# Patient Record
Sex: Male | Born: 1935
Health system: Southern US, Community
[De-identification: ages and names within clinical notes are randomized; demographics above are authoritative.]

## PROBLEM LIST (undated history)

## (undated) DIAGNOSIS — I482 Chronic atrial fibrillation, unspecified: Secondary | ICD-10-CM

## (undated) DIAGNOSIS — I10 Essential (primary) hypertension: Secondary | ICD-10-CM

## (undated) DIAGNOSIS — K5790 Diverticulosis of intestine, part unspecified, without perforation or abscess without bleeding: Secondary | ICD-10-CM

## (undated) DIAGNOSIS — I340 Nonrheumatic mitral (valve) insufficiency: Secondary | ICD-10-CM

## (undated) HISTORY — PX: BACK SURGERY: SHX140

## (undated) HISTORY — DX: Diverticulosis of intestine, part unspecified, without perforation or abscess without bleeding: K57.90

## (undated) HISTORY — PX: NASAL SINUS SURGERY: SHX719

## (undated) HISTORY — DX: Essential (primary) hypertension: I10

---

## 1998-11-10 ENCOUNTER — Ambulatory Visit (HOSPITAL_BASED_OUTPATIENT_CLINIC_OR_DEPARTMENT_OTHER): Admission: RE | Admit: 1998-11-10 | Discharge: 1998-11-10 | Payer: Self-pay | Admitting: Diagnostic Radiology

## 1999-04-20 ENCOUNTER — Encounter: Payer: Self-pay | Admitting: Gastroenterology

## 1999-04-20 ENCOUNTER — Ambulatory Visit (HOSPITAL_COMMUNITY): Admission: RE | Admit: 1999-04-20 | Discharge: 1999-04-20 | Payer: Self-pay | Admitting: Gastroenterology

## 1999-06-03 ENCOUNTER — Ambulatory Visit (HOSPITAL_BASED_OUTPATIENT_CLINIC_OR_DEPARTMENT_OTHER): Admission: RE | Admit: 1999-06-03 | Discharge: 1999-06-03 | Payer: Self-pay | Admitting: *Deleted

## 1999-12-27 HISTORY — PX: INTRAOCULAR LENS INSERTION: SHX110

## 1999-12-27 HISTORY — PX: CATARACT EXTRACTION: SUR2

## 2000-05-14 ENCOUNTER — Inpatient Hospital Stay (HOSPITAL_COMMUNITY): Admission: EM | Admit: 2000-05-14 | Discharge: 2000-05-15 | Payer: Self-pay | Admitting: Emergency Medicine

## 2000-05-14 ENCOUNTER — Encounter: Payer: Self-pay | Admitting: Emergency Medicine

## 2001-01-23 ENCOUNTER — Emergency Department (HOSPITAL_COMMUNITY): Admission: EM | Admit: 2001-01-23 | Discharge: 2001-01-23 | Payer: Self-pay | Admitting: Emergency Medicine

## 2001-01-26 ENCOUNTER — Encounter (INDEPENDENT_AMBULATORY_CARE_PROVIDER_SITE_OTHER): Payer: Self-pay

## 2001-01-26 ENCOUNTER — Ambulatory Visit (HOSPITAL_BASED_OUTPATIENT_CLINIC_OR_DEPARTMENT_OTHER): Admission: RE | Admit: 2001-01-26 | Discharge: 2001-01-27 | Payer: Self-pay | Admitting: *Deleted

## 2001-01-26 ENCOUNTER — Encounter: Payer: Self-pay | Admitting: *Deleted

## 2001-01-26 ENCOUNTER — Encounter: Admission: RE | Admit: 2001-01-26 | Discharge: 2001-01-26 | Payer: Self-pay | Admitting: *Deleted

## 2001-06-11 ENCOUNTER — Ambulatory Visit (HOSPITAL_COMMUNITY): Admission: RE | Admit: 2001-06-11 | Discharge: 2001-06-11 | Payer: Self-pay | Admitting: Gastroenterology

## 2001-06-11 ENCOUNTER — Encounter: Payer: Self-pay | Admitting: Gastroenterology

## 2001-07-10 ENCOUNTER — Encounter: Payer: Self-pay | Admitting: Gastroenterology

## 2001-07-10 ENCOUNTER — Ambulatory Visit (HOSPITAL_COMMUNITY): Admission: RE | Admit: 2001-07-10 | Discharge: 2001-07-10 | Payer: Self-pay | Admitting: Gastroenterology

## 2002-09-15 ENCOUNTER — Emergency Department (HOSPITAL_COMMUNITY): Admission: EM | Admit: 2002-09-15 | Discharge: 2002-09-15 | Payer: Self-pay | Admitting: Emergency Medicine

## 2007-05-08 ENCOUNTER — Ambulatory Visit (HOSPITAL_COMMUNITY): Admission: RE | Admit: 2007-05-08 | Discharge: 2007-05-09 | Payer: Self-pay | Admitting: Orthopedic Surgery

## 2008-07-25 ENCOUNTER — Ambulatory Visit (HOSPITAL_COMMUNITY): Admission: RE | Admit: 2008-07-25 | Discharge: 2008-07-25 | Payer: Self-pay | Admitting: General Surgery

## 2008-07-25 HISTORY — PX: HERNIA REPAIR: SHX51

## 2011-05-10 NOTE — Op Note (Signed)
NAME:  George Potter, George Potter                ACCOUNT NO.:  [id number]   MEDICAL RECORD NO.:  [id number]          PATIENT TYPE:  AMB   LOCATION:  DAY                          FACILITY:  [facility]   PHYSICIAN:  Ronald A. Gioffre, M.D.DATE OF BIRTH:  1926/07/10   DATE OF PROCEDURE:  05/08/2007  DATE OF DISCHARGE:                               OPERATIVE REPORT   SURGEON:  Georges Lynch. Darrelyn Hillock, M.D.   ASSISTANT:  Marlowe Kays MD   PREOPERATIVE DIAGNOSES:  1. Lateral recess stenosis at L4-5 on the left which is severe.  2. Foraminal disk herniation on the left at L4-5 with partial foot      drop on the left.   POSTOPERATIVE DIAGNOSES:  1. Lateral recess stenosis at L4-5 on the left which is severe.  2. Foraminal disk herniation on the left at L4-5 with partial foot      drop on the left.   OPERATION:  1. Partial facetectomy and decompression of the lateral recess for      severe spinal stenosis at L4-5 on the left.  2. Excision of a microdiskectomy at L4-5 on the left for a foraminal      disk.   PROCEDURE:  Under general anesthesia, routine orthopedic prep and  draping of the back was carried out.  The patient had 1 gram of IV Ancef  preop.  With the patient on spinal frame two needles were placed in the  back, x-ray was taken to localize our position.  Following that an  incision was made over the L4-5 space and extended distally.  Muscle was  stripped from the lamina at L4-5 on the left and from the spinous  process.  Another x-ray was taken with instruments in place to verify  the disk space.  At this time I then utilized to bur to bur out  laterally to decompress the lateral recess and to help to our  laminectomies.  Following this, I then went down and brought the  microscope in, removed the ligamentum flavum and continued our  dissection out laterally, proximally and distally, carried out  hemilaminectomy and a partial facetectomy and foraminotomies for both 4  and 5 roots.  Note he  had a large amount of disk material up under the  subligamentous area, disk was displaced in the foramen.  I took a nerve  hook and teased the material out.  I then utilized the Epstein curette  to curette the remaining part of the disk into the space and then  completed diskectomy.  Once we decompressed the lateral recess and the  foramen and removed our foraminal disk.  We then went into the disk  space in the midline and removed the remaining part of the disk  material.  We did a foraminotomy for the 5 root as well.  When the  procedure was completed we were able to easily pass a hockey-stick out  the foramina of the 4 and 5 roots.  We cauterized lateral recess veins  with a bipolar.  Thoroughly irrigated out the wound.  We reinspected the  nerve roots and the  dura.  We now had good freedom of the root.  Following that we loosely applied some thrombin-soaked Gelfoam and  closed the wound in layers usual  fashion.  I left the proximal portions of the proximal and distal deep  parts of the wound open for drainage purposes.  I injected 20 mL 0.5%  Marcaine epinephrine into the muscle.  The patient had 15 mg of Toradol  IV prior to leaving surgery.  The main part of the wound was closed with  metal staples.  Sterile Neosporin dressing was applied.           ______________________________  Georges Lynch Darrelyn Hillock, M.D.     RAG/MEDQ  D:  05/08/2007  T:  05/09/2007  Job:  [id number]

## 2011-05-10 NOTE — Op Note (Signed)
NAME:  [name], [name] NO.:  [id number]   MEDICAL RECORD NO.:  [id number]          PATIENT TYPE:  AMB   LOCATION:  DAY                          FACILITY:  [facility]   PHYSICIAN:  Leonie Man, M.D.   DATE OF BIRTH:  28-Aug-1926   DATE OF PROCEDURE:  07/25/2008  DATE OF DISCHARGE:                               OPERATIVE REPORT   PREOPERATIVE DIAGNOSIS:  Right inguinal hernia   POSTOPERATIVE DIAGNOSIS:  Right direct and indirect inguinal hernias.   PROCEDURE:  Right inguinal herniorrhaphy using a large polypropylene  mesh.   SURGEON:  Leonie Man, M.D.   ASSISTANT:  OR nurse.   ANESTHESIA:  General.   NOTE:  The patient is an 77 year old gentleman who was been having some  increasing symptoms from a large right-sided inguinal hernia.  No  symptoms of continued bowel obstruction.  No bladder neck obstruction.  No chronic constipation.  The patient comes to the operating room now  after the risks and potential benefits of surgery have been fully  discussed.  All questions were answered and consent for surgery  obtained.   PROCEDURE:  With the patient positioned supinely, following the  induction of satisfactory general anesthesia the right groin is prepped  and draped to be included in the sterile operative field.  The patient  is identified as George Potter and the side to be operated on as a right  side which was appropriately marked.  All precautions and the operating  room precautions were carried out.   I infiltrated the right lower abdominal crease with 0.5% Marcaine with  epinephrine and made a transverse incision that was deepened down  through to the external oblique aponeurosis.  This was opened up through  the external inguinal ring with protection of the ilioinguinal nerve.  The spermatic cord was elevated and held with a Penrose drain.  Dissection along the anterior medial border of the spermatic cord  revealed an indirect hernia sac which is  dissected free from the  surrounding cord contents and taken up to the internal ring.  The sac is  opened.  There were no intraabdominal contents within it.  The sac was  suture ligated with 2-0 silk and the redundant sac is amputated and  discarded.  On inspection of the direct space there was a large direct  inguinal hernia which was dissected free from the spermatic cord and  repaired with an onlay patch of polypropylene mesh which was sewn in at  the pubic tubercle with 2-0 Novofil.  The suture line was run up along  the conjoined tendon to the internal ring and another suture line from  the pubic tubercle, run up along and sewing the mesh to the shelving  edge of Poupart's ligament up to the internal ring.  The mesh is split  so as to allow the spermatic cord to come through the leaflets of the  mesh and the tails of the mesh were then sutured down behind the cord  into the internal oblique muscle.  All areas of dissection were checked  for hemostasis and noted to  be dry.  The pubic tubercle was injected  with additional injections of 0.5% Marcaine with epinephrine.  The  spermatic cord was returned to its normal anatomical position and the  external oblique aponeurosis closed with running 2-0 Vicryl suture.  Subcutaneous tissues and Scarpa fascia  closed with running suture of 3-0 Vicryl suture and the skin was closed  with 4-0 Monocryl, reinforced with Dermabond and Steri-Strips and a  sterile dressing was applied.  The anesthetic reversed.  The patient  removed from the operating room to the recovery room in stable  condition.  He tolerated the procedure well.      Leonie Man, M.D.  Electronically Signed     PB/MEDQ  D:  07/25/2008  T:  07/25/2008  Job:  [id number]

## 2011-05-13 NOTE — H&P (Signed)
[place]. [facility]  Patient:    George Potter, George Potter                       MRN: [id number] Adm. Date:  [id number] Attending:  Beatris Ship                         History and Physical  CHIEF COMPLAINT:  Abdominal pain, nausea and vomiting.  HISTORY OF PRESENT ILLNESS:  Mr. Rodier is a pleasant 77 year old gentleman with a history of hypertension and no other significant history presenting at this time with increasing abdominal pain, nausea, vomiting, and diarrhea.  The patient has very little past medical history with the exception of chronic hypertension, which has been well-controlled.  He had approximately two to three days of lower abdominal cramps, most notable Friday, followed by three episodes of bilious vomiting and three episodes of loose, green stools.  Prior to yesterdays episode, he did have a normal bowel movement reportedly on Thursday.  Accompanying this, he has developed progressive lower abdominal pain and discomfort and presented to the emergency room, where he was found to be evidently distended and have a radiographic pattern possibly compatible with a bowel obstruction.  He has had no fever, chills, or sweats.  He denies any urinary symptoms.  He has had no bloody stools or bloody emesis.  He has had no cough or sputum.  ALLERGIES:  Intolerance to CODEINE.  MEDICATIONS:  Hydrochlorothiazide 25 mg daily, and it appears atenolol, dose not known.  PAST MEDICAL HISTORY:  Essential hypertension.  PAST SURGICAL HISTORY:  Right cataract extraction/lens implant April 2001, as well as multiple sinus surgeries.  FAMILY HISTORY:  Noncontributory.  SOCIAL HISTORY:  The patient is married and has one child and three grandchildren.  He is retired at this time.  He does not smoke or drink.  PHYSICAL EXAMINATION:  VITAL SIGNS:  Temperature 98.4, blood pressure 150/80, pulse 70, respiratory rate 20.  Weight 167 pounds.  Height 5 feet 11  inches.  GENERAL:  The patient is pleasant, in no distress.  HEENT:  Anicteric.  Pupils round, reactive.  Oropharynx benign.  NECK:  Supple, no JVD.  Carotids 2+/4+, no bruits.  LUNGS:  Clear.  CARDIOVASCULAR:  Regular rate and rhythm, no murmur, gallop, rub, or heave.  ABDOMEN:  Soft, bowel sounds present and good, no tenderness, minimal distention, no rebound or guarding, no hepatosplenomegaly.  EXTREMITIES:  No cyanosis, clubbing, or edema.  Pulses 2+.  NEUROLOGIC:  The patient is nonfocal.  ASSESSMENT: 1. Probable viral gastroenteritis with ileus.  Doubt true small bowel    obstruction based upon his already clinical improvement just in the    emergency room.  Given no further vomiting and benign exam, we will    withhold the NG tube that was planned given the inability to pass it at    this time, with further decisions regarding this depending on his course.    We will correct his electrolytes as well. 2. Essential hypertension, stable. DD:  05/14/00 TD:  05/15/00 Job: [id number]

## 2011-05-13 NOTE — Op Note (Signed)
[place]. [facility]  Patient:    George Potter, George Potter                       MRN: [id number] Proc. Date: 01/26/01 Adm. Date:  [id number] Attending:  Claudina Lick                           Operative Report  PREOPERATIVE DIAGNOSES:  Acute-chronic left frontal and ethmoid sinusitis with left preseptal orbital cellulitis.  PROCEDURE:  Left endoscopic ethmoidectomy and left transnasal frontal sinusotomy with drainage of frontal and ethmoid abscesses, with insertion left frontal Rains stent.  SURGEON:  Robert L. Lyman Bishop, M.D.  ANESTHESIA:  General.  INDICATION FOR PROCEDURE:  This patient had had a previous history of chronic recurring sinusitis and had undergone previous ethmoidectomy, maxillary antrostomies, and frontal sinusotomies, with an insertion of a Rains stent on the left side.  Following removal of the stent, patient had done well for almost a year and then approximately one week prior to admission began having pain and swelling in his left eye, particularly in the medial orbital area. This became progressively more severe despite antibiotics, Augmentin.  CT scan showed an opacified left frontal sinus with some clouding and opacification in the anterior superior left ethmoid, and ophthalmology examination showed evidence of a preseptal orbital cellulitis.  Patient admitted for surgery. Examination showed a very diffuse, marked erythema and edema of the left periorbital, upper and to a lesser extent left lower eyelid, beginning to cross over the dorsum of the nose and slightly in the right intraorbital area. Intranasally, endoscopically both sides looked essentially clear.  There was smooth mucosa, somewhat erythematous, in the anterior superior nasal cavity on the left side, but there was no exudate, crusting, or other abnormality.  DESCRIPTION OF PROCEDURE:  After satisfactory general endotracheal anesthesia had been induced, topical  epinephrine packs were placed in the left superior nasal cavity, and the area was then infiltrated with 1% Xylocaine containing 1:100,000 epinephrine for hemostasis.  Nose and face were then prepped with Betadine and sterile drapes applied.  The InstaTrak headset was applied, and the InstaTrak was used throughout the operative procedure.  The straight and the angled sections were calibrated and using these as a guide and the 0 degree endoscope, the soft tissue mucosa in the region of the left frontoethmoid recess area was taken down.  Extreme amount of scarring was encountered.  The septum had been pulled over.  This was released, and as this was followed superiorly, the abscess was opened up and cultures were taken for both anaerobic and aerobic, and quick, rapid Gram stains were done, which showed no bacteria.  A large amount of bone around the nasofrontal duct area was taken down with the curette and bone-biting forceps.  Very thick, inflammatory mucosa was removed from the lower part of the left frontal sinus cavity and supraorbital area, and above that another narrowed area into the superior part of the sinus was opened up with the angled curette so that I could then pass the suction easily into that area.  As the scarring in the roof of the ethmoid on the left side was removed, some thick, heavy bone in that area was taken down, and a small abscess lying against the posterior ethmoid plate was removed along with a small bone with some exposure of 3-4 mm of orbital fat posteriorly.  The anterior ethmoid artery was cauterized with  the bipolar.  A complete cleanout of the ethmoid was accomplished.  A large Rains stent with the shaft shortened approximately one-third was then inserted into the left frontal, extending as far superiorly as it could, and a stirp of Adaptic gauze packing impregnated with Cortisporin ointment was then packed around the stent, extending up into the inferior part  of the frontal sinus and surrounding the shaft of the stent posteriorly as well.  Thick reinforced Silastic sheeting was sutured to the left side of the nasal septum, extending to the roof of the nose and an additional strip of Adaptic gauze impregnated with Cortisporin was placed in the roof of the nose anteriorly, leaving the nose open inferiorly.  The patient had a large opening into the left ethmoid sinus, well-healed, and this was left unobstructed.  Estimated blood loss was less than 50 cc.  The patient was given 3 g of Unasyn IV and 12 mg of Decadron IV intraoperatively.  He tolerated the procedure well, was awakened from anesthesia and taken to the recovery room in satisfactory condition. DD:  01/26/01 TD:  01/27/01 Job: [id number]

## 2011-09-23 LAB — CBC
HCT: 45.3
Hemoglobin: 15.3
MCHC: 33.7
MCV: 91.1
RBC: 4.98

## 2011-09-23 LAB — COMPREHENSIVE METABOLIC PANEL
ALT: 17
BUN: 8
CO2: 29
Calcium: 9.5
GFR calc non Af Amer: >60
Glucose, Bld: 109 — ABNORMAL HIGH
Sodium: 137

## 2011-09-23 LAB — DIFFERENTIAL
Basophils Relative: 1
Eosinophils Absolute: 0.1
Lymphs Abs: 2.1
Neutro Abs: 3.3
Neutrophils Relative %: 54

## 2011-09-23 LAB — PROTIME-INR
INR: 1
Prothrombin Time: 13.9

## 2012-02-15 DIAGNOSIS — M7981 Nontraumatic hematoma of soft tissue: Secondary | ICD-10-CM | POA: Diagnosis not present

## 2012-02-15 DIAGNOSIS — I8310 Varicose veins of unspecified lower extremity with inflammation: Secondary | ICD-10-CM | POA: Diagnosis not present

## 2012-03-13 DIAGNOSIS — I8310 Varicose veins of unspecified lower extremity with inflammation: Secondary | ICD-10-CM | POA: Diagnosis not present

## 2012-03-21 DIAGNOSIS — R21 Rash and other nonspecific skin eruption: Secondary | ICD-10-CM | POA: Diagnosis not present

## 2012-05-15 DIAGNOSIS — I8310 Varicose veins of unspecified lower extremity with inflammation: Secondary | ICD-10-CM | POA: Diagnosis not present

## 2012-05-15 DIAGNOSIS — M79609 Pain in unspecified limb: Secondary | ICD-10-CM | POA: Diagnosis not present

## 2012-05-15 DIAGNOSIS — M7981 Nontraumatic hematoma of soft tissue: Secondary | ICD-10-CM | POA: Diagnosis not present

## 2012-05-29 DIAGNOSIS — M7981 Nontraumatic hematoma of soft tissue: Secondary | ICD-10-CM | POA: Diagnosis not present

## 2012-05-29 DIAGNOSIS — M79609 Pain in unspecified limb: Secondary | ICD-10-CM | POA: Diagnosis not present

## 2012-05-29 DIAGNOSIS — I8310 Varicose veins of unspecified lower extremity with inflammation: Secondary | ICD-10-CM | POA: Diagnosis not present

## 2012-09-18 DIAGNOSIS — Z23 Encounter for immunization: Secondary | ICD-10-CM | POA: Diagnosis not present

## 2012-12-07 DIAGNOSIS — H35379 Puckering of macula, unspecified eye: Secondary | ICD-10-CM | POA: Diagnosis not present

## 2012-12-07 DIAGNOSIS — H353190 Nonexudative age-related macular degeneration, unspecified eye, stage unspecified: Secondary | ICD-10-CM | POA: Diagnosis not present

## 2012-12-25 DIAGNOSIS — I8310 Varicose veins of unspecified lower extremity with inflammation: Secondary | ICD-10-CM | POA: Diagnosis not present

## 2013-01-03 DIAGNOSIS — I8310 Varicose veins of unspecified lower extremity with inflammation: Secondary | ICD-10-CM | POA: Diagnosis not present

## 2013-01-08 DIAGNOSIS — R7301 Impaired fasting glucose: Secondary | ICD-10-CM | POA: Diagnosis not present

## 2013-01-08 DIAGNOSIS — E785 Hyperlipidemia, unspecified: Secondary | ICD-10-CM | POA: Diagnosis not present

## 2013-01-08 DIAGNOSIS — Z125 Encounter for screening for malignant neoplasm of prostate: Secondary | ICD-10-CM | POA: Diagnosis not present

## 2013-01-08 DIAGNOSIS — I10 Essential (primary) hypertension: Secondary | ICD-10-CM | POA: Diagnosis not present

## 2013-01-15 DIAGNOSIS — I440 Atrioventricular block, first degree: Secondary | ICD-10-CM | POA: Diagnosis not present

## 2013-01-15 DIAGNOSIS — H6120 Impacted cerumen, unspecified ear: Secondary | ICD-10-CM | POA: Diagnosis not present

## 2013-01-15 DIAGNOSIS — E875 Hyperkalemia: Secondary | ICD-10-CM | POA: Diagnosis not present

## 2013-01-15 DIAGNOSIS — Z0000 Encounter for general adult medical examination without abnormal findings: Secondary | ICD-10-CM | POA: Diagnosis not present

## 2013-04-09 DIAGNOSIS — I10 Essential (primary) hypertension: Secondary | ICD-10-CM | POA: Diagnosis not present

## 2013-04-09 DIAGNOSIS — M436 Torticollis: Secondary | ICD-10-CM | POA: Diagnosis not present

## 2013-04-09 DIAGNOSIS — IMO0002 Reserved for concepts with insufficient information to code with codable children: Secondary | ICD-10-CM | POA: Diagnosis not present

## 2013-06-07 DIAGNOSIS — H05229 Edema of unspecified orbit: Secondary | ICD-10-CM | POA: Diagnosis not present

## 2013-06-07 DIAGNOSIS — J019 Acute sinusitis, unspecified: Secondary | ICD-10-CM | POA: Diagnosis not present

## 2013-06-10 DIAGNOSIS — I10 Essential (primary) hypertension: Secondary | ICD-10-CM | POA: Diagnosis not present

## 2013-06-10 DIAGNOSIS — J301 Allergic rhinitis due to pollen: Secondary | ICD-10-CM | POA: Diagnosis not present

## 2013-06-10 DIAGNOSIS — H1030 Unspecified acute conjunctivitis, unspecified eye: Secondary | ICD-10-CM | POA: Diagnosis not present

## 2013-06-10 DIAGNOSIS — J321 Chronic frontal sinusitis: Secondary | ICD-10-CM | POA: Diagnosis not present

## 2013-09-18 DIAGNOSIS — Z23 Encounter for immunization: Secondary | ICD-10-CM | POA: Diagnosis not present

## 2013-09-23 DIAGNOSIS — M79609 Pain in unspecified limb: Secondary | ICD-10-CM | POA: Diagnosis not present

## 2013-09-23 DIAGNOSIS — T148XXA Other injury of unspecified body region, initial encounter: Secondary | ICD-10-CM | POA: Diagnosis not present

## 2013-12-09 DIAGNOSIS — H04229 Epiphora due to insufficient drainage, unspecified lacrimal gland: Secondary | ICD-10-CM | POA: Diagnosis not present

## 2013-12-09 DIAGNOSIS — H1045 Other chronic allergic conjunctivitis: Secondary | ICD-10-CM | POA: Diagnosis not present

## 2013-12-09 DIAGNOSIS — H353190 Nonexudative age-related macular degeneration, unspecified eye, stage unspecified: Secondary | ICD-10-CM | POA: Diagnosis not present

## 2013-12-09 DIAGNOSIS — H43819 Vitreous degeneration, unspecified eye: Secondary | ICD-10-CM | POA: Diagnosis not present

## 2013-12-09 DIAGNOSIS — Z961 Presence of intraocular lens: Secondary | ICD-10-CM | POA: Diagnosis not present

## 2013-12-09 DIAGNOSIS — H35379 Puckering of macula, unspecified eye: Secondary | ICD-10-CM | POA: Diagnosis not present

## 2014-01-06 DIAGNOSIS — R059 Cough, unspecified: Secondary | ICD-10-CM | POA: Diagnosis not present

## 2014-01-06 DIAGNOSIS — IMO0002 Reserved for concepts with insufficient information to code with codable children: Secondary | ICD-10-CM | POA: Diagnosis not present

## 2014-01-06 DIAGNOSIS — J321 Chronic frontal sinusitis: Secondary | ICD-10-CM | POA: Diagnosis not present

## 2014-01-06 DIAGNOSIS — R05 Cough: Secondary | ICD-10-CM | POA: Diagnosis not present

## 2014-01-06 DIAGNOSIS — H6120 Impacted cerumen, unspecified ear: Secondary | ICD-10-CM | POA: Diagnosis not present

## 2014-02-10 DIAGNOSIS — I10 Essential (primary) hypertension: Secondary | ICD-10-CM | POA: Diagnosis not present

## 2014-02-10 DIAGNOSIS — Z125 Encounter for screening for malignant neoplasm of prostate: Secondary | ICD-10-CM | POA: Diagnosis not present

## 2014-02-10 DIAGNOSIS — R7301 Impaired fasting glucose: Secondary | ICD-10-CM | POA: Diagnosis not present

## 2014-02-10 DIAGNOSIS — E785 Hyperlipidemia, unspecified: Secondary | ICD-10-CM | POA: Diagnosis not present

## 2014-02-11 DIAGNOSIS — Z1212 Encounter for screening for malignant neoplasm of rectum: Secondary | ICD-10-CM | POA: Diagnosis not present

## 2014-02-24 DIAGNOSIS — I440 Atrioventricular block, first degree: Secondary | ICD-10-CM | POA: Diagnosis not present

## 2014-02-24 DIAGNOSIS — Z23 Encounter for immunization: Secondary | ICD-10-CM | POA: Diagnosis not present

## 2014-02-24 DIAGNOSIS — R011 Cardiac murmur, unspecified: Secondary | ICD-10-CM | POA: Diagnosis not present

## 2014-02-24 DIAGNOSIS — E785 Hyperlipidemia, unspecified: Secondary | ICD-10-CM | POA: Diagnosis not present

## 2014-02-24 DIAGNOSIS — Z1331 Encounter for screening for depression: Secondary | ICD-10-CM | POA: Diagnosis not present

## 2014-02-24 DIAGNOSIS — Z0000 Encounter for general adult medical examination without abnormal findings: Secondary | ICD-10-CM | POA: Diagnosis not present

## 2014-02-24 DIAGNOSIS — H8110 Benign paroxysmal vertigo, unspecified ear: Secondary | ICD-10-CM | POA: Diagnosis not present

## 2014-02-24 DIAGNOSIS — I10 Essential (primary) hypertension: Secondary | ICD-10-CM | POA: Diagnosis not present

## 2014-02-24 DIAGNOSIS — Z125 Encounter for screening for malignant neoplasm of prostate: Secondary | ICD-10-CM | POA: Diagnosis not present

## 2014-02-24 DIAGNOSIS — L989 Disorder of the skin and subcutaneous tissue, unspecified: Secondary | ICD-10-CM | POA: Diagnosis not present

## 2014-02-24 DIAGNOSIS — L259 Unspecified contact dermatitis, unspecified cause: Secondary | ICD-10-CM | POA: Diagnosis not present

## 2014-02-27 DIAGNOSIS — C44320 Squamous cell carcinoma of skin of unspecified parts of face: Secondary | ICD-10-CM | POA: Diagnosis not present

## 2014-02-27 DIAGNOSIS — D485 Neoplasm of uncertain behavior of skin: Secondary | ICD-10-CM | POA: Diagnosis not present

## 2014-04-09 DIAGNOSIS — D485 Neoplasm of uncertain behavior of skin: Secondary | ICD-10-CM | POA: Diagnosis not present

## 2014-04-09 DIAGNOSIS — Z85828 Personal history of other malignant neoplasm of skin: Secondary | ICD-10-CM | POA: Diagnosis not present

## 2014-07-09 ENCOUNTER — Encounter (HOSPITAL_COMMUNITY): Payer: Self-pay | Admitting: Emergency Medicine

## 2014-07-09 ENCOUNTER — Emergency Department (HOSPITAL_COMMUNITY)
Admission: EM | Admit: 2014-07-09 | Discharge: 2014-07-09 | Disposition: A | Discharge status: Home / Self Care | Payer: Medicare Other | Attending: Emergency Medicine | Admitting: Emergency Medicine

## 2014-07-09 ENCOUNTER — Emergency Department (HOSPITAL_COMMUNITY): Payer: Medicare Other

## 2014-07-09 DIAGNOSIS — Z7982 Long term (current) use of aspirin: Secondary | ICD-10-CM | POA: Diagnosis not present

## 2014-07-09 DIAGNOSIS — I10 Essential (primary) hypertension: Secondary | ICD-10-CM | POA: Insufficient documentation

## 2014-07-09 DIAGNOSIS — R079 Chest pain, unspecified: Secondary | ICD-10-CM | POA: Diagnosis not present

## 2014-07-09 DIAGNOSIS — R0789 Other chest pain: Secondary | ICD-10-CM | POA: Insufficient documentation

## 2014-07-09 DIAGNOSIS — Z79899 Other long term (current) drug therapy: Secondary | ICD-10-CM | POA: Insufficient documentation

## 2014-07-09 DIAGNOSIS — E785 Hyperlipidemia, unspecified: Secondary | ICD-10-CM | POA: Insufficient documentation

## 2014-07-09 LAB — CBC
HCT: 42.3 % (ref 39.0–52.0)
HEMOGLOBIN: 14.6 g/dL (ref 13.0–17.0)
MCH: 30.2 pg (ref 26.0–34.0)
MCHC: 34.5 g/dL (ref 30.0–36.0)
MCV: 87.6 fL (ref 78.0–100.0)
Platelets: 175 10*3/uL (ref 150–400)
RBC: 4.83 MIL/uL (ref 4.22–5.81)
RDW: 13.3 % (ref 11.5–15.5)
WBC: 6.1 10*3/uL (ref 4.0–10.5)

## 2014-07-09 LAB — BASIC METABOLIC PANEL
Anion gap: 15 (ref 5–15)
BUN: 7 mg/dL (ref 6–23)
CHLORIDE: 99 meq/L (ref 96–112)
CO2: 24 mEq/L (ref 19–32)
Calcium: 9.4 mg/dL (ref 8.4–10.5)
Creatinine, Ser: 0.89 mg/dL (ref 0.50–1.35)
GFR calc non Af Amer: 75 mL/min — ABNORMAL LOW (ref >90)
GFR, EST AFRICAN AMERICAN: 87 mL/min — AB (ref >90)
Glucose, Bld: 104 mg/dL — ABNORMAL HIGH (ref 70–99)
POTASSIUM: 4.1 meq/L (ref 3.7–5.3)
Sodium: 138 mEq/L (ref 137–147)

## 2014-07-09 LAB — I-STAT TROPONIN, ED: TROPONIN I, POC: 0.01 ng/mL (ref 0.00–0.08)

## 2014-07-09 NOTE — ED Notes (Signed)
Pt in c/o left upper chest pain that has been intermittent since yesterday afternoon, denies pain at this time, denies radiation with pain or other symptoms, took 81mg  ASA this am. No distress noted.

## 2014-07-09 NOTE — ED Notes (Signed)
Patient presents today c/o of a pressure sensation in left upper chest near shoulder that is intermittent and last for seconds at a time, not reproducible with palpation or movement, nor does it occur or increase with deep inspiration.  The pain occurred three times since yesterday afternoon  and three times since this am at 0500, although he did not wake up with the pain. Denies recent long travel or recent surgeries.  Denies cardiac history except for hyperlipidemia and hypertension, for which he takes Lisinopril, amlodapine, and Crestor.

## 2014-07-09 NOTE — ED Notes (Addendum)
Patient transported to x-ray. ?

## 2014-07-09 NOTE — ED Notes (Signed)
Dr. Carolyn Stare at bedside

## 2014-07-09 NOTE — ED Provider Notes (Signed)
CSN: [id number]     Arrival date & time 07/09/14  0914 History   First MD Initiated Contact with Patient 07/09/14 [phone]     Chief Complaint  Patient presents with  . Chest Pain     (Consider location/radiation/quality/duration/timing/severity/associated sxs/prior Treatment) HPI George Potter is a 80 y.o. male that presents to the ED with upper left sided chest pain. Pain started yesterday afternoon. He had three episodes yesterday and two today. Episodes lasted only a few seconds and have been non-exertional. Pain comes on randomly and is not alleviated or aggravated by anything. The pain is reproducible and does not radiate. He has no shortness of breath, dizziness, headaches, nausea/vomiting. No history of leg swelling, blood clots or pulmonary embolism.  PMH: Hypertension; Hyperlipidemia  Past Surgical History  Procedure Laterality Date  . Nasal sinus surgery      multiple surgeries  . Cataract extraction Right 2001  . Intraocular lens insertion Right 2001  . Hernia repair Right 07/25/2008    Right inguinal herniorrhaphy using a large polypropylene  . Back surgery      Partial facetectomy and decompression L4-L5   No family history on file. History  Substance Use Topics  . Smoking status: Never Smoker   . Smokeless tobacco: Not on file  . Alcohol Use: Not on file    Review of Systems  Constitutional: Negative for fever.  Respiratory: Negative for cough, chest tightness, shortness of breath and wheezing.   Cardiovascular: Positive for chest pain. Negative for palpitations and leg swelling.  Gastrointestinal: Negative for nausea and vomiting.  All other systems reviewed and are negative.     Allergies  Review of patient's allergies indicates no known allergies.  Home Medications   Prior to Admission medications   Medication Sig Start Date End Date Taking? Authorizing Provider  amLODipine (NORVASC) 5 MG tablet Take 5 mg by mouth daily.   Yes Historical Provider, MD   aspirin EC 81 MG tablet Take 81 mg by mouth every Monday, Wednesday, and Friday.   Yes Historical Provider, MD  CALCIUM-VITAMIN D PO Take 1 tablet by mouth daily.   Yes Historical Provider, MD  lisinopril (PRINIVIL,ZESTRIL) 40 MG tablet Take 40 mg by mouth daily.   Yes Historical Provider, MD  Omega-3 Fatty Acids (FISH OIL) 1200 MG CAPS Take 1,200 mg by mouth daily.   Yes Historical Provider, MD  omeprazole (PRILOSEC) 20 MG capsule Take 20 mg by mouth daily.   Yes Historical Provider, MD  polyvinyl alcohol-povidone (REFRESH) 1.4-0.6 % ophthalmic solution Place 1-2 drops into both eyes as needed (for dry eyes).   Yes Historical Provider, MD  rosuvastatin (CRESTOR) 20 MG tablet Take 20 mg by mouth daily.   Yes Historical Provider, MD   BP 134/72  Pulse 60  Temp(Src) 97 F (36.1 C) (Oral)  Resp 20  Ht 5\' 10"  (1.778 m)  Wt 152 lb (68.947 kg)  BMI 21.81 kg/m2  SpO2 96%  Physical Exam  Constitutional: He is oriented to person, place, and time. He appears well-developed and well-nourished.  Eyes: Conjunctivae and EOM are normal.  Cardiovascular: Normal rate, regular rhythm and normal heart sounds.   Pulmonary/Chest: Effort normal and breath sounds normal. He exhibits tenderness (left pectoralis major near shoulder).  Abdominal: Soft. Bowel sounds are normal.  Musculoskeletal: He exhibits no edema and no tenderness.  Lymphadenopathy:    He has no axillary adenopathy.  Neurological: He is alert and oriented to person, place, and time.  Skin: Skin is warm  and dry.    ED Course  Procedures (including critical care time) Labs Review Labs Reviewed  BASIC METABOLIC PANEL - Abnormal; Notable for the following:    Glucose, Bld 104 (*)    GFR calc non Af Amer 75 (*)    GFR calc Af Amer 87 (*)    All other components within normal limits  CBC  I-STAT TROPOININ, ED    Imaging Review Dg Chest 2 View  07/09/2014   CLINICAL DATA:  Chest pain.  EXAM: CHEST  2 VIEW  COMPARISON:  07/22/2008  and 05/08/2007.  FINDINGS: The heart size and mediastinal contours are stable. There is mild aortic atherosclerosis. The lungs are clear. There is stable mild biapical pleural thickening. No pleural effusion, pneumothorax or acute osseous abnormality is seen.  IMPRESSION: Stable chest.  No acute cardiopulmonary process.   Electronically Signed   By: Camie Patience M.D.   On: 07/09/2014 10:10     EKG Interpretation   Date/Time:  Wednesday July 09 2014 09:15:52 EDT Ventricular Rate:  80 PR Interval:  234 QRS Duration: 94 QT Interval:  358 QTC Calculation: 412 R Axis:   2 Text Interpretation:  Sinus rhythm with 1st degree A-V block with  occasional Premature ventricular complexes No significant change since  last tracing 7 Confirmed by Ashok Cordia  MD, Lennette Bihari ([id number]) on 07/09/2014  10:07:59 AM      MDM   Final diagnoses:  Atypical chest pain    Patient with atypical chest pain. Patient not currently in pain. Chest x-ray negative for pneumothorax or other lung pathology. Chest and lung exam unremarkable. Troponin 0.01. EKG with no ST changes. Pain reproducible. Most likely musculoskeletal. Patient currently stable. Especially because of age, will recommend close follow-up with PCP and recommend stress test.  Cordelia Poche, MD 07/09/14 [phone]

## 2014-07-09 NOTE — Discharge Instructions (Signed)
George Potter, we evaluated your chest pain. It does not appear cardiac, but it would be beneficial for you to have a stress test. This is done by Cardiology. Your primary care physician can arrange follow-up with a Cardiologist for this study. If you develop chest pain that is sustained for 15 minutes, please return to be evaluated.    Chest Pain (Nonspecific) It is often hard to give a specific diagnosis for the cause of chest pain. There is always a chance that your pain could be related to something serious, such as a heart attack or a blood clot in the lungs. You need to follow up with your health care provider for further evaluation. CAUSES   Heartburn.  Pneumonia or bronchitis.  Anxiety or stress.  Inflammation around your heart (pericarditis) or lung (pleuritis or pleurisy).  A blood clot in the lung.  A collapsed lung (pneumothorax). It can develop suddenly on its own (spontaneous pneumothorax) or from trauma to the chest.  Shingles infection (herpes zoster virus). The chest wall is composed of bones, muscles, and cartilage. Any of these can be the source of the pain.  The bones can be bruised by injury.  The muscles or cartilage can be strained by coughing or overwork.  The cartilage can be affected by inflammation and become sore (costochondritis). DIAGNOSIS  Lab tests or other studies may be needed to find the cause of your pain. Your health care provider may have you take a test called an ambulatory electrocardiogram (ECG). An ECG records your heartbeat patterns over a 24-hour period. You may also have other tests, such as:  Transthoracic echocardiogram (TTE). During echocardiography, sound waves are used to evaluate how blood flows through your heart.  Transesophageal echocardiogram (TEE).  Cardiac monitoring. This allows your health care provider to monitor your heart rate and rhythm in real time.  Holter monitor. This is a portable device that records your heartbeat  and can help diagnose heart arrhythmias. It allows your health care provider to track your heart activity for several days, if needed.  Stress tests by exercise or by giving medicine that makes the heart beat faster. TREATMENT   Treatment depends on what may be causing your chest pain. Treatment may include:  Acid blockers for heartburn.  Anti-inflammatory medicine.  Pain medicine for inflammatory conditions.  Antibiotics if an infection is present.  You may be advised to change lifestyle habits. This includes stopping smoking and avoiding alcohol, caffeine, and chocolate.  You may be advised to keep your head raised (elevated) when sleeping. This reduces the chance of acid going backward from your stomach into your esophagus. Most of the time, nonspecific chest pain will improve within 2-3 days with rest and mild pain medicine.  HOME CARE INSTRUCTIONS   If antibiotics were prescribed, take them as directed. Finish them even if you start to feel better.  For the next few days, avoid physical activities that bring on chest pain. Continue physical activities as directed.  Do not use any tobacco products, including cigarettes, chewing tobacco, or electronic cigarettes.  Avoid drinking alcohol.  Only take medicine as directed by your health care provider.  Follow your health care provider's suggestions for further testing if your chest pain does not go away.  Keep any follow-up appointments you made. If you do not go to an appointment, you could develop lasting (chronic) problems with pain. If there is any problem keeping an appointment, call to reschedule. SEEK MEDICAL CARE IF:   Your chest pain  does not go away, even after treatment.  You have a rash with blisters on your chest.  You have a fever. SEEK IMMEDIATE MEDICAL CARE IF:   You have increased chest pain or pain that spreads to your arm, neck, jaw, back, or abdomen.  You have shortness of breath.  You have an  increasing cough, or you cough up blood.  You have severe back or abdominal pain.  You feel nauseous or vomit.  You have severe weakness.  You faint.  You have chills. This is an emergency. Do not wait to see if the pain will go away. Get medical help at once. Call your local emergency services (911 in [place] Do not drive yourself to the hospital. MAKE SURE YOU:   Understand these instructions.  Will watch your condition.  Will get help right away if you are not doing well or get worse. Document Released: 09/21/2005 Document Revised: 12/17/2013 Document Reviewed: 07/17/2008 [facility] Patient Information 2015 [place], [place]. This information is not intended to replace advice given to you by your health care provider. Make sure you discuss any questions you have with your health care provider.

## 2014-07-09 NOTE — ED Notes (Addendum)
Patient returned from xray.

## 2014-07-14 DIAGNOSIS — I10 Essential (primary) hypertension: Secondary | ICD-10-CM | POA: Diagnosis not present

## 2014-07-14 DIAGNOSIS — L989 Disorder of the skin and subcutaneous tissue, unspecified: Secondary | ICD-10-CM | POA: Diagnosis not present

## 2014-07-14 DIAGNOSIS — R0789 Other chest pain: Secondary | ICD-10-CM | POA: Diagnosis not present

## 2014-07-14 DIAGNOSIS — R7301 Impaired fasting glucose: Secondary | ICD-10-CM | POA: Diagnosis not present

## 2014-07-14 DIAGNOSIS — IMO0002 Reserved for concepts with insufficient information to code with codable children: Secondary | ICD-10-CM | POA: Diagnosis not present

## 2014-07-16 NOTE — ED Provider Notes (Signed)
I saw and evaluated the patient, reviewed the resident's note and I agree with the findings and plan.   EKG Interpretation   Date/Time:  Wednesday July 09 2014 09:15:52 EDT Ventricular Rate:  80 PR Interval:  234 QRS Duration: 94 QT Interval:  358 QTC Calculation: 412 R Axis:   2 Text Interpretation:  Sinus rhythm with 1st degree A-V block with  occasional Premature ventricular complexes No significant change since  last tracing 7 Confirmed by Rally Ouch  MD, Lennette Bihari ([id number]) on 07/09/2014  10:07:59 AM     Pt c/o intermittent chest pain. Episodes occur at rest. No associated sob, nv, or diaphoresis. Episodes past 1-3 seconds. No more prolonged episodes cp. Ecg. Cxr.  Chest cta.     Mirna Mires, MD 07/16/14 [phone]

## 2014-08-05 DIAGNOSIS — M25429 Effusion, unspecified elbow: Secondary | ICD-10-CM | POA: Diagnosis not present

## 2014-08-05 DIAGNOSIS — M7020 Olecranon bursitis, unspecified elbow: Secondary | ICD-10-CM | POA: Diagnosis not present

## 2014-08-11 DIAGNOSIS — M7020 Olecranon bursitis, unspecified elbow: Secondary | ICD-10-CM | POA: Diagnosis not present

## 2014-08-11 DIAGNOSIS — M25429 Effusion, unspecified elbow: Secondary | ICD-10-CM | POA: Diagnosis not present

## 2014-08-26 DIAGNOSIS — M25429 Effusion, unspecified elbow: Secondary | ICD-10-CM | POA: Diagnosis not present

## 2014-08-26 DIAGNOSIS — M7020 Olecranon bursitis, unspecified elbow: Secondary | ICD-10-CM | POA: Diagnosis not present

## 2014-09-19 DIAGNOSIS — Z23 Encounter for immunization: Secondary | ICD-10-CM | POA: Diagnosis not present

## 2014-10-08 DIAGNOSIS — L570 Actinic keratosis: Secondary | ICD-10-CM | POA: Diagnosis not present

## 2014-10-08 DIAGNOSIS — D485 Neoplasm of uncertain behavior of skin: Secondary | ICD-10-CM | POA: Diagnosis not present

## 2014-10-08 DIAGNOSIS — Z85828 Personal history of other malignant neoplasm of skin: Secondary | ICD-10-CM | POA: Diagnosis not present

## 2014-10-08 DIAGNOSIS — Z08 Encounter for follow-up examination after completed treatment for malignant neoplasm: Secondary | ICD-10-CM | POA: Diagnosis not present

## 2014-11-12 DIAGNOSIS — L820 Inflamed seborrheic keratosis: Secondary | ICD-10-CM | POA: Diagnosis not present

## 2015-01-20 DIAGNOSIS — H35373 Puckering of macula, bilateral: Secondary | ICD-10-CM | POA: Diagnosis not present

## 2015-01-20 DIAGNOSIS — H35342 Macular cyst, hole, or pseudohole, left eye: Secondary | ICD-10-CM | POA: Diagnosis not present

## 2015-01-20 DIAGNOSIS — H3531 Nonexudative age-related macular degeneration: Secondary | ICD-10-CM | POA: Diagnosis not present

## 2015-01-20 DIAGNOSIS — H524 Presbyopia: Secondary | ICD-10-CM | POA: Diagnosis not present

## 2015-02-23 DIAGNOSIS — R7301 Impaired fasting glucose: Secondary | ICD-10-CM | POA: Diagnosis not present

## 2015-02-23 DIAGNOSIS — Z008 Encounter for other general examination: Secondary | ICD-10-CM | POA: Diagnosis not present

## 2015-02-23 DIAGNOSIS — E785 Hyperlipidemia, unspecified: Secondary | ICD-10-CM | POA: Diagnosis not present

## 2015-02-23 DIAGNOSIS — I10 Essential (primary) hypertension: Secondary | ICD-10-CM | POA: Diagnosis not present

## 2015-02-23 DIAGNOSIS — Z125 Encounter for screening for malignant neoplasm of prostate: Secondary | ICD-10-CM | POA: Diagnosis not present

## 2015-02-24 DIAGNOSIS — Z1212 Encounter for screening for malignant neoplasm of rectum: Secondary | ICD-10-CM | POA: Diagnosis not present

## 2015-03-02 DIAGNOSIS — R011 Cardiac murmur, unspecified: Secondary | ICD-10-CM | POA: Diagnosis not present

## 2015-03-02 DIAGNOSIS — I10 Essential (primary) hypertension: Secondary | ICD-10-CM | POA: Diagnosis not present

## 2015-03-02 DIAGNOSIS — Z6823 Body mass index (BMI) 23.0-23.9, adult: Secondary | ICD-10-CM | POA: Diagnosis not present

## 2015-03-02 DIAGNOSIS — M1990 Unspecified osteoarthritis, unspecified site: Secondary | ICD-10-CM | POA: Diagnosis not present

## 2015-03-02 DIAGNOSIS — K219 Gastro-esophageal reflux disease without esophagitis: Secondary | ICD-10-CM | POA: Diagnosis not present

## 2015-03-02 DIAGNOSIS — R7301 Impaired fasting glucose: Secondary | ICD-10-CM | POA: Diagnosis not present

## 2015-03-02 DIAGNOSIS — Z0000 Encounter for general adult medical examination without abnormal findings: Secondary | ICD-10-CM | POA: Diagnosis not present

## 2015-03-02 DIAGNOSIS — I440 Atrioventricular block, first degree: Secondary | ICD-10-CM | POA: Diagnosis not present

## 2015-03-02 DIAGNOSIS — M8580 Other specified disorders of bone density and structure, unspecified site: Secondary | ICD-10-CM | POA: Diagnosis not present

## 2015-03-02 DIAGNOSIS — E785 Hyperlipidemia, unspecified: Secondary | ICD-10-CM | POA: Diagnosis not present

## 2015-03-02 DIAGNOSIS — Z1389 Encounter for screening for other disorder: Secondary | ICD-10-CM | POA: Diagnosis not present

## 2015-04-08 DIAGNOSIS — Z08 Encounter for follow-up examination after completed treatment for malignant neoplasm: Secondary | ICD-10-CM | POA: Diagnosis not present

## 2015-04-08 DIAGNOSIS — L570 Actinic keratosis: Secondary | ICD-10-CM | POA: Diagnosis not present

## 2015-04-08 DIAGNOSIS — Z85828 Personal history of other malignant neoplasm of skin: Secondary | ICD-10-CM | POA: Diagnosis not present

## 2015-09-02 DIAGNOSIS — Z23 Encounter for immunization: Secondary | ICD-10-CM | POA: Diagnosis not present

## 2016-01-26 DIAGNOSIS — H353112 Nonexudative age-related macular degeneration, right eye, intermediate dry stage: Secondary | ICD-10-CM | POA: Diagnosis not present

## 2016-01-26 DIAGNOSIS — H35342 Macular cyst, hole, or pseudohole, left eye: Secondary | ICD-10-CM | POA: Diagnosis not present

## 2016-01-26 DIAGNOSIS — H353122 Nonexudative age-related macular degeneration, left eye, intermediate dry stage: Secondary | ICD-10-CM | POA: Diagnosis not present

## 2016-01-26 DIAGNOSIS — H35373 Puckering of macula, bilateral: Secondary | ICD-10-CM | POA: Diagnosis not present

## 2016-01-26 DIAGNOSIS — H43813 Vitreous degeneration, bilateral: Secondary | ICD-10-CM | POA: Diagnosis not present

## 2016-01-26 DIAGNOSIS — H04123 Dry eye syndrome of bilateral lacrimal glands: Secondary | ICD-10-CM | POA: Diagnosis not present

## 2016-02-05 DIAGNOSIS — M109 Gout, unspecified: Secondary | ICD-10-CM | POA: Diagnosis not present

## 2016-02-05 DIAGNOSIS — Z6823 Body mass index (BMI) 23.0-23.9, adult: Secondary | ICD-10-CM | POA: Diagnosis not present

## 2016-04-07 DIAGNOSIS — L308 Other specified dermatitis: Secondary | ICD-10-CM | POA: Diagnosis not present

## 2016-04-07 DIAGNOSIS — L570 Actinic keratosis: Secondary | ICD-10-CM | POA: Diagnosis not present

## 2016-04-20 DIAGNOSIS — M109 Gout, unspecified: Secondary | ICD-10-CM | POA: Diagnosis not present

## 2016-04-20 DIAGNOSIS — R7301 Impaired fasting glucose: Secondary | ICD-10-CM | POA: Diagnosis not present

## 2016-04-20 DIAGNOSIS — M859 Disorder of bone density and structure, unspecified: Secondary | ICD-10-CM | POA: Diagnosis not present

## 2016-04-20 DIAGNOSIS — E784 Other hyperlipidemia: Secondary | ICD-10-CM | POA: Diagnosis not present

## 2016-04-20 DIAGNOSIS — I10 Essential (primary) hypertension: Secondary | ICD-10-CM | POA: Diagnosis not present

## 2016-04-27 DIAGNOSIS — I440 Atrioventricular block, first degree: Secondary | ICD-10-CM | POA: Diagnosis not present

## 2016-04-27 DIAGNOSIS — K219 Gastro-esophageal reflux disease without esophagitis: Secondary | ICD-10-CM | POA: Diagnosis not present

## 2016-04-27 DIAGNOSIS — I10 Essential (primary) hypertension: Secondary | ICD-10-CM | POA: Diagnosis not present

## 2016-04-27 DIAGNOSIS — M109 Gout, unspecified: Secondary | ICD-10-CM | POA: Diagnosis not present

## 2016-04-27 DIAGNOSIS — R011 Cardiac murmur, unspecified: Secondary | ICD-10-CM | POA: Diagnosis not present

## 2016-04-27 DIAGNOSIS — E784 Other hyperlipidemia: Secondary | ICD-10-CM | POA: Diagnosis not present

## 2016-04-27 DIAGNOSIS — Z1389 Encounter for screening for other disorder: Secondary | ICD-10-CM | POA: Diagnosis not present

## 2016-04-27 DIAGNOSIS — Z6824 Body mass index (BMI) 24.0-24.9, adult: Secondary | ICD-10-CM | POA: Diagnosis not present

## 2016-04-27 DIAGNOSIS — L42 Pityriasis rosea: Secondary | ICD-10-CM | POA: Diagnosis not present

## 2016-04-27 DIAGNOSIS — R7301 Impaired fasting glucose: Secondary | ICD-10-CM | POA: Diagnosis not present

## 2016-04-27 DIAGNOSIS — H8113 Benign paroxysmal vertigo, bilateral: Secondary | ICD-10-CM | POA: Diagnosis not present

## 2016-04-27 DIAGNOSIS — Z0000 Encounter for general adult medical examination without abnormal findings: Secondary | ICD-10-CM | POA: Diagnosis not present

## 2016-05-05 DIAGNOSIS — H1031 Unspecified acute conjunctivitis, right eye: Secondary | ICD-10-CM | POA: Diagnosis not present

## 2016-05-05 DIAGNOSIS — H01003 Unspecified blepharitis right eye, unspecified eyelid: Secondary | ICD-10-CM | POA: Diagnosis not present

## 2016-05-05 DIAGNOSIS — H579 Unspecified disorder of eye and adnexa: Secondary | ICD-10-CM | POA: Diagnosis not present

## 2016-09-22 DIAGNOSIS — Z23 Encounter for immunization: Secondary | ICD-10-CM | POA: Diagnosis not present

## 2016-09-26 DIAGNOSIS — D0439 Carcinoma in situ of skin of other parts of face: Secondary | ICD-10-CM | POA: Diagnosis not present

## 2016-09-26 DIAGNOSIS — L570 Actinic keratosis: Secondary | ICD-10-CM | POA: Diagnosis not present

## 2016-09-26 DIAGNOSIS — C44319 Basal cell carcinoma of skin of other parts of face: Secondary | ICD-10-CM | POA: Diagnosis not present

## 2016-09-27 DIAGNOSIS — D0439 Carcinoma in situ of skin of other parts of face: Secondary | ICD-10-CM | POA: Diagnosis not present

## 2016-09-27 DIAGNOSIS — C44319 Basal cell carcinoma of skin of other parts of face: Secondary | ICD-10-CM | POA: Diagnosis not present

## 2016-10-04 DIAGNOSIS — L0100 Impetigo, unspecified: Secondary | ICD-10-CM | POA: Diagnosis not present

## 2016-10-24 DIAGNOSIS — R42 Dizziness and giddiness: Secondary | ICD-10-CM | POA: Diagnosis not present

## 2016-10-24 DIAGNOSIS — Z6824 Body mass index (BMI) 24.0-24.9, adult: Secondary | ICD-10-CM | POA: Diagnosis not present

## 2016-10-24 DIAGNOSIS — I10 Essential (primary) hypertension: Secondary | ICD-10-CM | POA: Diagnosis not present

## 2016-10-24 DIAGNOSIS — R7301 Impaired fasting glucose: Secondary | ICD-10-CM | POA: Diagnosis not present

## 2016-10-24 DIAGNOSIS — E784 Other hyperlipidemia: Secondary | ICD-10-CM | POA: Diagnosis not present

## 2016-11-07 DIAGNOSIS — Z85828 Personal history of other malignant neoplasm of skin: Secondary | ICD-10-CM | POA: Diagnosis not present

## 2016-12-01 DIAGNOSIS — R2232 Localized swelling, mass and lump, left upper limb: Secondary | ICD-10-CM | POA: Diagnosis not present

## 2016-12-01 DIAGNOSIS — Z6824 Body mass index (BMI) 24.0-24.9, adult: Secondary | ICD-10-CM | POA: Diagnosis not present

## 2016-12-01 DIAGNOSIS — M778 Other enthesopathies, not elsewhere classified: Secondary | ICD-10-CM | POA: Diagnosis not present

## 2016-12-01 DIAGNOSIS — I10 Essential (primary) hypertension: Secondary | ICD-10-CM | POA: Diagnosis not present

## 2017-02-06 DIAGNOSIS — Z85828 Personal history of other malignant neoplasm of skin: Secondary | ICD-10-CM | POA: Diagnosis not present

## 2017-02-06 DIAGNOSIS — L905 Scar conditions and fibrosis of skin: Secondary | ICD-10-CM | POA: Diagnosis not present

## 2017-02-27 DIAGNOSIS — Z961 Presence of intraocular lens: Secondary | ICD-10-CM | POA: Diagnosis not present

## 2017-02-27 DIAGNOSIS — H353132 Nonexudative age-related macular degeneration, bilateral, intermediate dry stage: Secondary | ICD-10-CM | POA: Diagnosis not present

## 2017-02-27 DIAGNOSIS — H35372 Puckering of macula, left eye: Secondary | ICD-10-CM | POA: Diagnosis not present

## 2017-02-27 DIAGNOSIS — H43813 Vitreous degeneration, bilateral: Secondary | ICD-10-CM | POA: Diagnosis not present

## 2017-05-05 DIAGNOSIS — R7301 Impaired fasting glucose: Secondary | ICD-10-CM | POA: Diagnosis not present

## 2017-05-05 DIAGNOSIS — E784 Other hyperlipidemia: Secondary | ICD-10-CM | POA: Diagnosis not present

## 2017-05-05 DIAGNOSIS — I10 Essential (primary) hypertension: Secondary | ICD-10-CM | POA: Diagnosis not present

## 2017-05-05 DIAGNOSIS — M109 Gout, unspecified: Secondary | ICD-10-CM | POA: Diagnosis not present

## 2017-05-05 DIAGNOSIS — Z125 Encounter for screening for malignant neoplasm of prostate: Secondary | ICD-10-CM | POA: Diagnosis not present

## 2017-05-05 DIAGNOSIS — M859 Disorder of bone density and structure, unspecified: Secondary | ICD-10-CM | POA: Diagnosis not present

## 2017-05-11 DIAGNOSIS — R42 Dizziness and giddiness: Secondary | ICD-10-CM | POA: Diagnosis not present

## 2017-05-11 DIAGNOSIS — M778 Other enthesopathies, not elsewhere classified: Secondary | ICD-10-CM | POA: Diagnosis not present

## 2017-05-11 DIAGNOSIS — I440 Atrioventricular block, first degree: Secondary | ICD-10-CM | POA: Diagnosis not present

## 2017-05-11 DIAGNOSIS — L42 Pityriasis rosea: Secondary | ICD-10-CM | POA: Diagnosis not present

## 2017-05-11 DIAGNOSIS — M109 Gout, unspecified: Secondary | ICD-10-CM | POA: Diagnosis not present

## 2017-05-11 DIAGNOSIS — Z0000 Encounter for general adult medical examination without abnormal findings: Secondary | ICD-10-CM | POA: Diagnosis not present

## 2017-05-11 DIAGNOSIS — Z1389 Encounter for screening for other disorder: Secondary | ICD-10-CM | POA: Diagnosis not present

## 2017-05-11 DIAGNOSIS — R0789 Other chest pain: Secondary | ICD-10-CM | POA: Diagnosis not present

## 2017-05-11 DIAGNOSIS — H6123 Impacted cerumen, bilateral: Secondary | ICD-10-CM | POA: Diagnosis not present

## 2017-05-11 DIAGNOSIS — R808 Other proteinuria: Secondary | ICD-10-CM | POA: Diagnosis not present

## 2017-05-11 DIAGNOSIS — M859 Disorder of bone density and structure, unspecified: Secondary | ICD-10-CM | POA: Diagnosis not present

## 2017-05-11 DIAGNOSIS — Z6824 Body mass index (BMI) 24.0-24.9, adult: Secondary | ICD-10-CM | POA: Diagnosis not present

## 2017-05-12 ENCOUNTER — Telehealth: Payer: Self-pay | Admitting: Cardiology

## 2017-05-12 NOTE — Telephone Encounter (Signed)
Received records from [facility] for appointment on 05/26/17 with Dr Percival Spanish.  Records put with Dr Hochrein's schedule for 05/26/17. lp

## 2017-05-24 NOTE — Progress Notes (Signed)
Cardiology Office Note   Date:  05/26/2017   ID:  George Potter, DOB 12-14-1926, MRN [id number]  PCP:  Crist Infante, MD  Cardiologist:   Minus Breeding, MD  Referring:  Crist Infante, MD  Chief Complaint  Patient presents with  . Atrial Fibrillation      History of Present Illness: George Potter is a 83 y.o. male who is referred by Crist Infante, MD for evaluation of atrial fib.  The patient has no past cardiac history. He was recently noted to have atrial fibrillation. He doesn't feel this. He doesn't feel palpitations. He's had no presyncope or syncope. Denies any chest pressure, neck or arm discomfort. He's had no weight gain or edema. He was taken off aspirin and started on Eliquis.  He is active. 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 With this he denies any cardiovascular symptoms.  He's never had any prior cardiac workup.  Past Medical History:  Diagnosis Date  . Atrial fibrillation ([place])   . Diverticulosis   . HTN (hypertension)    Mild    Past Surgical History:  Procedure Laterality Date  . BACK SURGERY     Partial facetectomy and decompression L4-L5  . CATARACT EXTRACTION Right 2001  . HERNIA REPAIR Right 07/25/2008   Right inguinal herniorrhaphy using a large polypropylene  . INTRAOCULAR LENS INSERTION Right 2001  . NASAL SINUS SURGERY     multiple surgeries     Current Outpatient Prescriptions  Medication Sig Dispense Refill  . apixaban (ELIQUIS) 5 MG TABS tablet Take 5 mg by mouth 2 (two) times daily.    Marland Kitchen atorvastatin (LIPITOR) 40 MG tablet Take 40 mg by mouth daily.    . Cholecalciferol (VITAMIN D3) 1000 units CAPS Take 2,000 Units by mouth daily.    . Omega-3 Fatty Acids (FISH OIL) 1200 MG CAPS Take 1,200 mg by mouth daily.    Marland Kitchen omeprazole (PRILOSEC) 20 MG capsule Take 20 mg by mouth daily.    . polyvinyl alcohol-povidone (REFRESH) 1.4-0.6 % ophthalmic solution Place 1-2 drops into both eyes as needed (for dry eyes).    .  valsartan (DIOVAN) 80 MG tablet Take 80 mg by mouth daily.     No current facility-administered medications for this visit.     Allergies:   Patient has no known allergies.    Social History:  The patient  reports that he has never smoked. He has never used smokeless tobacco.   Family History:  The patient's does not include early heart disease and fibrillation. He's not sure his parents died from.  ROS:  Please see the history of present illness.   Otherwise, review of systems are positive for none.   All other systems are reviewed and negative.    PHYSICAL EXAM: VS:  BP (!) 160/82   Pulse 88   Ht 5\' 7"  (1.702 m)   Wt 161 lb (73 kg)   BMI 25.22 kg/m  , BMI Body mass index is 25.22 kg/m. GENERAL:  Well appearing, looks younger than stated age 69:  Pupils equal round and reactive, fundi not visualized, oral mucosa unremarkable NECK:  No jugular venous distention, waveform within normal limits, carotid upstroke brisk and symmetric, no bruits, no thyromegaly LYMPHATICS:  No cervical, inguinal adenopathy LUNGS:  Clear to auscultation bilaterally BACK:  No CVA tenderness CHEST:  Unremarkable HEART:  PMI not displaced or sustained,S1 and S2 within normal limits, no S3,  no clicks, no rubs,  2/6 apical and axillary brief systolic murmur, no diastolic murmurs, irregular ABD:  Flat, positive bowel sounds normal in frequency in pitch, no bruits, no rebound, no guarding, no midline pulsatile mass, no hepatomegaly, no splenomegaly EXT:  2 plus pulses throughout, no edema, no cyanosis no clubbing SKIN:  No rashes no nodules NEURO:  Cranial nerves II through XII grossly intact, motor grossly intact throughout PSYCH:  Cognitively intact, oriented to person place and time    EKG:  EKG is ordered today. The ekg ordered today demonstrates atrial fibrillation, rate 88, axis within normal limits, intervals within normal limits, borderline low voltage in the limb leads, premature ventricular  contraction   Recent Labs: No results found for requested labs within last 8760 hours.    Lipid Panel No results found for: CHOL, TRIG, HDL, CHOLHDL, VLDL, LDLCALC, LDLDIRECT    Wt Readings from Last 3 Encounters:  05/26/17 161 lb (73 kg)  07/09/14 152 lb (68.9 kg)      Other studies Reviewed: Additional studies/ records that were reviewed today include: Office records and labs. Review of the above records demonstrates:  Please see elsewhere in the note.     ASSESSMENT AND PLAN:  ATRIAL FIB:  George Potter has a CHA2DS2 - VASc score of 2.  He will wear a 24 hour Holter to judge rate control.  Otherwise he seems to do well with this rhythm. There've been no reason for cardioversion. He tolerates anticoagulation.  I was able to review labs he had normal TSH and electrolytes.  MURMUR:  I suspect he has some mild mitral regurgitation and I will check an echocardiogram.  HTN:  His blood pressures elevated today but he says this is unusual. I see that he's had his meds adjusted recently by Crist Infante, MD.  I will defer to his management.      Current medicines are reviewed at length with the patient today.  The patient does not have concerns regarding medicines.  The following changes have been made:  no change  Labs/ tests ordered today include:    Orders Placed This Encounter  Procedures  . Holter monitor - 24 hour  . EKG 12-Lead  . ECHOCARDIOGRAM COMPLETE     Disposition:   FU with 6 months and then probably yearly afterward or as needed.     Signed, Minus Breeding, MD  05/26/2017 9:31 AM    [facility]

## 2017-05-26 ENCOUNTER — Ambulatory Visit (INDEPENDENT_AMBULATORY_CARE_PROVIDER_SITE_OTHER): Payer: Medicare Other | Admitting: Cardiology

## 2017-05-26 ENCOUNTER — Encounter: Payer: Self-pay | Admitting: Cardiology

## 2017-05-26 VITALS — BP 160/82 | HR 88 | Ht 67.0 in | Wt 161.0 lb

## 2017-05-26 DIAGNOSIS — I4819 Other persistent atrial fibrillation: Secondary | ICD-10-CM

## 2017-05-26 DIAGNOSIS — R011 Cardiac murmur, unspecified: Secondary | ICD-10-CM | POA: Diagnosis not present

## 2017-05-26 DIAGNOSIS — I481 Persistent atrial fibrillation: Secondary | ICD-10-CM

## 2017-05-26 NOTE — Patient Instructions (Signed)
Medication Instructions:  Continue current medication  Labwork: None Ordered  Testing/Procedures: Your physician has requested that you have an echocardiogram. Echocardiography is a painless test that uses sound waves to create images of your heart. It provides your doctor with information about the size and shape of your heart and how well your heart's chambers and valves are working. This procedure takes approximately one hour. There are no restrictions for this procedure.  Your physician has recommended that you wear a 24 hour holter monitor. Holter monitors are medical devices that record the heart's electrical activity. Doctors most often use these monitors to diagnose arrhythmias. Arrhythmias are problems with the speed or rhythm of the heartbeat. The monitor is a small, portable device. You can wear one while you do your normal daily activities. This is usually used to diagnose what is causing palpitations/syncope (passing out).  Follow-Up: Your physician wants you to follow-up in: 6 Months. You will receive a reminder letter in the mail two months in advance. If you don't receive a letter, please call our office to schedule the follow-up appointment.   Any Other Special Instructions Will Be Listed Below (If Applicable).   If you need a refill on your cardiac medications before your next appointment, please call your pharmacy.

## 2017-06-07 ENCOUNTER — Other Ambulatory Visit: Payer: Self-pay

## 2017-06-07 ENCOUNTER — Ambulatory Visit (HOSPITAL_COMMUNITY): Payer: Medicare Other | Attending: Cardiology

## 2017-06-07 ENCOUNTER — Ambulatory Visit (INDEPENDENT_AMBULATORY_CARE_PROVIDER_SITE_OTHER): Payer: Medicare Other

## 2017-06-07 DIAGNOSIS — I77810 Thoracic aortic ectasia: Secondary | ICD-10-CM | POA: Insufficient documentation

## 2017-06-07 DIAGNOSIS — R011 Cardiac murmur, unspecified: Secondary | ICD-10-CM | POA: Insufficient documentation

## 2017-06-07 DIAGNOSIS — I4819 Other persistent atrial fibrillation: Secondary | ICD-10-CM

## 2017-06-07 DIAGNOSIS — I481 Persistent atrial fibrillation: Secondary | ICD-10-CM

## 2017-06-07 DIAGNOSIS — I119 Hypertensive heart disease without heart failure: Secondary | ICD-10-CM | POA: Diagnosis not present

## 2017-06-07 DIAGNOSIS — I083 Combined rheumatic disorders of mitral, aortic and tricuspid valves: Secondary | ICD-10-CM | POA: Insufficient documentation

## 2017-06-12 DIAGNOSIS — M859 Disorder of bone density and structure, unspecified: Secondary | ICD-10-CM | POA: Diagnosis not present

## 2017-06-13 DIAGNOSIS — E875 Hyperkalemia: Secondary | ICD-10-CM | POA: Diagnosis not present

## 2017-06-17 ENCOUNTER — Encounter (HOSPITAL_COMMUNITY): Payer: Self-pay

## 2017-06-17 ENCOUNTER — Emergency Department (HOSPITAL_COMMUNITY): Payer: Medicare Other

## 2017-06-17 ENCOUNTER — Emergency Department (HOSPITAL_COMMUNITY)
Admission: EM | Admit: 2017-06-17 | Discharge: 2017-06-18 | Disposition: A | Discharge status: Home / Self Care | Payer: Medicare Other | Attending: Emergency Medicine | Admitting: Emergency Medicine

## 2017-06-17 DIAGNOSIS — Z7902 Long term (current) use of antithrombotics/antiplatelets: Secondary | ICD-10-CM | POA: Diagnosis not present

## 2017-06-17 DIAGNOSIS — L03113 Cellulitis of right upper limb: Secondary | ICD-10-CM | POA: Diagnosis not present

## 2017-06-17 DIAGNOSIS — I10 Essential (primary) hypertension: Secondary | ICD-10-CM | POA: Insufficient documentation

## 2017-06-17 DIAGNOSIS — Z79899 Other long term (current) drug therapy: Secondary | ICD-10-CM | POA: Insufficient documentation

## 2017-06-17 DIAGNOSIS — M79641 Pain in right hand: Secondary | ICD-10-CM | POA: Diagnosis not present

## 2017-06-17 DIAGNOSIS — M25531 Pain in right wrist: Secondary | ICD-10-CM | POA: Diagnosis not present

## 2017-06-17 DIAGNOSIS — M7989 Other specified soft tissue disorders: Secondary | ICD-10-CM | POA: Diagnosis present

## 2017-06-17 MED ORDER — CLINDAMYCIN HCL 300 MG PO CAPS
300.0000 mg | ORAL_CAPSULE | Freq: Once | ORAL | Status: AC
  Administered 2017-06-17: 300 mg via ORAL
  Filled 2017-06-17: qty 1

## 2017-06-17 MED ORDER — ACETAMINOPHEN 500 MG PO TABS
1000.0000 mg | ORAL_TABLET | Freq: Once | ORAL | Status: AC
  Administered 2017-06-17: 1000 mg via ORAL
  Filled 2017-06-17: qty 2

## 2017-06-17 MED ORDER — HYDROCODONE-ACETAMINOPHEN 5-325 MG PO TABS: 1.0000 | ORAL_TABLET | Freq: Four times a day (QID) | ORAL | 0 refills | Status: DC | PRN

## 2017-06-17 MED ORDER — CLINDAMYCIN HCL 150 MG PO CAPS: 300.0000 mg | ORAL_CAPSULE | Freq: Four times a day (QID) | ORAL | 0 refills | Status: AC

## 2017-06-17 MED ORDER — HYDROCODONE-ACETAMINOPHEN 5-325 MG PO TABS
1.0000 | ORAL_TABLET | Freq: Once | ORAL | Status: AC
  Administered 2017-06-17: 1 via ORAL
  Filled 2017-06-17: qty 1

## 2017-06-17 NOTE — ED Provider Notes (Signed)
Medical screening examination/treatment/procedure(s) were conducted as a shared visit with non-physician practitioner(s) and myself.  I personally evaluated the patient during the encounter.   EKG Interpretation None     Patient here complaining of pain and redness at the dorsum of his right hand. X-ray was negative. He is afebrile. Suspect cellulitis and did not think that this represents a septic joint. Patient to return tomorrow for recheck.   Lacretia Leigh, MD 06/17/17 [phone]

## 2017-06-17 NOTE — ED Notes (Signed)
Pt presenting with family c/o rt hand swelling, 10/10 pain, and redness onset today that has gotten progressively worse. Reports no injury.

## 2017-06-17 NOTE — Discharge Instructions (Signed)
Please take all of your antibiotics until finished!   You may develop abdominal discomfort or diarrhea from the antibiotic.  You may help offset this with probiotics which you can buy or get in yogurt. Do not eat or take the probiotics until 2 hours after your antibiotic.   Apply ice or heat to the effected area for comfort. You may take 600 mg of ibuprofen as needed for pain, Norco for severe pain but do not drive, drink alcohol, or operate heavy machinery on this medication. Follow-up in the emergency department tomorrow for reevaluation of your skin infection. Return to the ED sooner if any concerning signs or symptoms develop such as fevers, chills, severe pain, or if the area of redness extends past the pen markings we made today.

## 2017-06-17 NOTE — ED Provider Notes (Signed)
[facility] DEPT Provider Note   CSN: [id number] Arrival date & time: 06/17/17  2059     History   Chief Complaint Chief Complaint  Patient presents with  . Hand Pain    HPI George Potter is a 83 y.o. male with history of A. fib on [place], diverticulosis, and HTN who presents today with chief complaint acute onset, progressively worsening right wrist and hand pain with associated swelling and erythema. He endorses developing of pain early this morning, states pain is now 10/10 sharp pain that begins at his wrist and radiates to his fingertips. Over the course of the day, he endorses development of erythema and swelling to the hand. Denies any known trauma or falls. Has tried ice and heat without relief. Has not tried any medications. Denies numbness, tingling, or weakness. States he had a similar episode approximately one year ago that was initially thought to be gout, but states that on reevaluation a physician told him otherwise. He describes a procedures similar to arthrocentesis that was performed. Denies fevers, chills, chest pain, short of breath, abdominal pain, nausea, vomiting, or diarrhea.  The history is provided by the patient.    Past Medical History:  Diagnosis Date  . Atrial fibrillation ([place])   . Diverticulosis   . HTN (hypertension)    Mild    There are no active problems to display for this patient.   Past Surgical History:  Procedure Laterality Date  . BACK SURGERY     Partial facetectomy and decompression L4-L5  . CATARACT EXTRACTION Right 2001  . HERNIA REPAIR Right 07/25/2008   Right inguinal herniorrhaphy using a large polypropylene  . INTRAOCULAR LENS INSERTION Right 2001  . NASAL SINUS SURGERY     multiple surgeries       Home Medications    Prior to Admission medications   Medication Sig Start Date End Date Taking? Authorizing Provider  apixaban (ELIQUIS) 5 MG TABS tablet Take 5 mg by mouth 2 (two) times daily.   Yes [provider]  atorvastatin (LIPITOR) 40 MG tablet Take 40 mg by mouth daily.   Yes [provider]  Cholecalciferol (VITAMIN D3) 1000 units CAPS Take 2,000 Units by mouth daily.   Yes [provider]  Omega-3 Fatty Acids (FISH OIL) 1200 MG CAPS Take 1,200 mg by mouth daily.   Yes [provider]  omeprazole (PRILOSEC) 20 MG capsule Take 20 mg by mouth daily.   Yes [provider]  valsartan (DIOVAN) 80 MG tablet Take 80 mg by mouth daily.   Yes [provider]  clindamycin (CLEOCIN) 150 MG capsule Take 2 capsules (300 mg total) by mouth 4 (four) times daily. 06/17/17 06/27/17  Rodell Perna A, PA-C  HYDROcodone-acetaminophen (NORCO/VICODIN) 5-325 MG tablet Take 1 tablet by mouth every 6 (six) hours as needed for severe pain. 06/17/17   Nils Flack, Takeia Ciaravino A, PA-C  polyvinyl alcohol-povidone (REFRESH) 1.4-0.6 % ophthalmic solution Place 1-2 drops into both eyes as needed (for dry eyes).    [provider]    Family History History reviewed. No pertinent family history.  Social History Social History  Substance Use Topics  . Smoking status: Never Smoker  . Smokeless tobacco: Never Used  . Alcohol use Not on file     Allergies   Patient has no known allergies.   Review of Systems Review of Systems  Constitutional: Negative for chills and fever.  Respiratory: Negative for shortness of breath.   Cardiovascular: Negative for chest  pain.  Gastrointestinal: Negative for abdominal pain, diarrhea, nausea and vomiting.  Musculoskeletal: Positive for arthralgias and joint swelling.  Skin: Positive for color change.  Neurological: Negative for weakness, light-headedness and numbness.  Hematological: Bruises/bleeds easily (on Eliquis for A-fib).  All other systems reviewed and are negative.    Physical Exam Updated Vital Signs BP (!) 166/122 (BP Location: Left Arm) Comment: Pt consistent with bp meds today  Pulse (!) 108   Temp 98.2 F (36.8  C) (Oral)   Resp 16   Ht 5\' 10"  (1.778 m)   Wt 68.1 kg (150 lb 3.2 oz)   SpO2 98%   BMI 21.55 kg/m   Physical Exam  Constitutional: He appears well-developed and well-nourished.  HENT:  Head: Normocephalic and atraumatic.  Eyes: Conjunctivae are normal. Right eye exhibits no discharge. Left eye exhibits no discharge.  Neck: No JVD present. No tracheal deviation present.  Cardiovascular: Intact distal pulses.   2+ radial pulses bl  Pulmonary/Chest: Effort normal.  Abdominal: He exhibits no distension.  Musculoskeletal: He exhibits edema and tenderness.       Right wrist: He exhibits decreased range of motion, tenderness and swelling. He exhibits no crepitus, no deformity and no laceration.  Limited range of motion of right wrist and digits due to pain. No deformity or crepitus noted. There is mild swelling and erythema to the dorsum of the right hand extending to the wrist. No fluctuance, induration, or drainage. No point of maximal tenderness. Diffuse tenderness to palpation of the dorsum of the right hand and wrist overlying erythema. No ttp of the right forearm or elbow   Neurological: He is alert. No sensory deficit.  Fluent speech, no facial droop, sensation to soft touch intact to bilateral hands and forearms  Skin: Skin is warm and dry. Capillary refill takes less than 2 seconds. There is erythema.  See MSK section  Psychiatric: He has a normal mood and affect. His behavior is normal.     ED Treatments / Results  Labs (all labs ordered are listed, but only abnormal results are displayed) Labs Reviewed - No data to display  EKG  EKG Interpretation None       Radiology Dg Wrist Complete Right  Result Date: 06/17/2017 CLINICAL DATA:  Right hand/wrist pain and redness since this morning. No known injury. EXAM: RIGHT WRIST - COMPLETE 3+ VIEW COMPARISON:  None. FINDINGS: There is no evidence of fracture or dislocation. Chondrocalcinosis of the triangular fibrocartilage.  Multifocal cystic change throughout the carpal bones with joint space narrowing at the scaphotrapezium and trapezoid. Advanced osteoarthritis at the base of the thumb at the carpal metacarpal joint. Minimal irregularity of the in distal ulna styloid is nonspecific. Osteophytes of the ulna at the distal radioulnar joint. No focal soft tissue abnormality. IMPRESSION: Chondrocalcinosis. Multifocal cystic change throughout the carpal bones which are likely degenerative degenerative, less likely secondary to crystalline arthropathy. There is osteoarthritis, prominent involving the base of the thumb. Electronically Signed   By: Jeb Levering M.D.   On: 06/17/2017 22:23   Dg Hand Complete Right  Result Date: 06/17/2017 CLINICAL DATA:  Pt c/o pain and redness on posterior right wrist and hand since this morning. Pt denies injury. EXAM: RIGHT HAND - COMPLETE 3+ VIEW COMPARISON:  None. FINDINGS: Degenerative changes are seen throughout the carpal bones, and at the first [facility] joint, moderate in degree, most compatible with osteoarthritis. Osseous lucencies within the carpal bones and about the first [facility] joint are likely chronic degenerative subchondral  cysts, with erosions considered less likely, again suggesting underlying degenerative osteoarthritis. There is associated radial subluxation of the first metacarpal bone. No acute appearing osseous abnormality. Soft tissues about the right hand are unremarkable. IMPRESSION: 1. No acute findings. 2. Degenerative osteoarthritis, as detailed above Electronically Signed   By: Franki Cabot M.D.   On: 06/17/2017 22:18    Procedures Procedures (including critical care time)  Medications Ordered in ED Medications  HYDROcodone-acetaminophen (NORCO/VICODIN) 5-325 MG per tablet 1 tablet (not administered)  acetaminophen (TYLENOL) tablet 1,000 mg (1,000 mg Oral Given 06/17/17 2228)  clindamycin (CLEOCIN) capsule 300 mg (300 mg Oral Given 06/17/17 2326)     Initial  Impression / Assessment and Plan / ED Course  I have reviewed the triage vital signs and the nursing notes.  Pertinent labs & imaging results that were available during my care of the patient were reviewed by me and considered in my medical decision making (see chart for details).     Patient with acute onset atraumatic right hand pain with associated erythema and swelling to the dorsum. Afebrile and generally well-appearing. Diffuse tenderness to palpation overlying erythema. X-rays show no acute fractures or dislocation with evidence of degenerative osteoarthritis low suspicion of osteomyelitis, gout, or septic arthritis. Likely cellulitis. Low suspicion of abscess or erysipelas. Will initiate treatment with clindamycin, and marked area of erythema. Pain managed with Norco while in the ED. Patient will follow up here for reevaluation tomorrow for skin checked. Discussed indications for return to the ED sooner. Patient and patient's son verbalized understanding of and agreement with plan and patient is stable for discharge home at this time. Patient seen and evaluated by Dr. Zenia Resides, who agrees with my assessment and plan.   Final Clinical Impressions(s) / ED Diagnoses   Final diagnoses:  Cellulitis of right hand    New Prescriptions New Prescriptions   CLINDAMYCIN (CLEOCIN) 150 MG CAPSULE    Take 2 capsules (300 mg total) by mouth 4 (four) times daily.   HYDROCODONE-ACETAMINOPHEN (NORCO/VICODIN) 5-325 MG TABLET    Take 1 tablet by mouth every 6 (six) hours as needed for severe pain.     Renita Papa, PA-C 06/17/17 2352

## 2017-06-17 NOTE — ED Triage Notes (Signed)
Pt reports swelling and pain in R hand and wrist since waking up this morning. No other complaints at the time. Had similar situation last year and gout was ruled out. Denies hx of carpal tunnel. A&Ox4. Ambulatory.

## 2017-06-17 NOTE — ED Notes (Signed)
No respiratory or acute distress noted no reaction to medication noted alert and oriented x 3 visitor at bedside call light in reach.

## 2017-06-18 ENCOUNTER — Emergency Department (HOSPITAL_COMMUNITY)
Admission: EM | Admit: 2017-06-18 | Discharge: 2017-06-18 | Disposition: A | Discharge status: Home / Self Care | Payer: Medicare Other | Attending: Emergency Medicine | Admitting: Emergency Medicine

## 2017-06-18 ENCOUNTER — Encounter (HOSPITAL_COMMUNITY): Payer: Self-pay | Admitting: *Deleted

## 2017-06-18 DIAGNOSIS — L03113 Cellulitis of right upper limb: Secondary | ICD-10-CM | POA: Diagnosis not present

## 2017-06-18 NOTE — ED Triage Notes (Signed)
Pt returned for recheck rt hand cellulitus, swelling and redness has decreased and pt can move hand and fingers this morning

## 2017-06-18 NOTE — Discharge Instructions (Signed)
Read the information below.  You may return to the Emergency Department at any time for worsening condition or any new symptoms that concern you.   Continue taking the antibiotics until the prescription is finished.  If you develop increased redness, swelling, red streaks going up your arms, pus draining from the wound, or fevers greater than 100.4, return to the ER immediately for a recheck.

## 2017-06-18 NOTE — ED Provider Notes (Signed)
[facility] DEPT Provider Note   CSN: [id number] Arrival date & time: 06/18/17  1027  By signing my name below, I, Collene Leyden, attest that this documentation has been prepared under the direction and in the presence of [facility], PA-C. Electronically Signed: Collene Leyden, Scribe. 06/18/17. 10:53 AM.  History   Chief Complaint Chief Complaint  Patient presents with  . Wound Check   HPI Comments: George Potter is a 83 y.o. male with a history of hypertension and atrial fibrillation on eliquis, who presents to the Emergency Department for an evaluation of a wound, which he was evaluated for yesterday. Patient was seen here yesterday for cellulitis of the right wrist, he was started on clindamycin and the was advised to return today for evaluation. Patient states his right hand was initially painful, swollen, and erythematous. After taking two doses of clindamycin his symptoms have progressively improved. States he was unable to move his wrist yesterday but today can move it easily, can move all of his fingers.  Patient denies any fever, pain, swelling, chills, or myalgias.   The history is provided by the patient. No language interpreter was used.    Past Medical History:  Diagnosis Date  . Atrial fibrillation ([place])   . Diverticulosis   . HTN (hypertension)    Mild    There are no active problems to display for this patient.   Past Surgical History:  Procedure Laterality Date  . BACK SURGERY     Partial facetectomy and decompression L4-L5  . CATARACT EXTRACTION Right 2001  . HERNIA REPAIR Right 07/25/2008   Right inguinal herniorrhaphy using a large polypropylene  . INTRAOCULAR LENS INSERTION Right 2001  . NASAL SINUS SURGERY     multiple surgeries       Home Medications    Prior to Admission medications   Medication Sig Start Date End Date Taking? Authorizing Provider  apixaban (ELIQUIS) 5 MG TABS tablet Take 5 mg by mouth 2 (two) times daily.    [provider]  atorvastatin (LIPITOR) 40 MG tablet Take 40 mg by mouth daily.    [provider]  Cholecalciferol (VITAMIN D3) 1000 units CAPS Take 2,000 Units by mouth daily.    [provider]  clindamycin (CLEOCIN) 150 MG capsule Take 2 capsules (300 mg total) by mouth 4 (four) times daily. 06/17/17 06/27/17  Rodell Perna A, PA-C  HYDROcodone-acetaminophen (NORCO/VICODIN) 5-325 MG tablet Take 1 tablet by mouth every 6 (six) hours as needed for severe pain. 06/17/17   Fawze, Mina A, PA-C  Omega-3 Fatty Acids (FISH OIL) 1200 MG CAPS Take 1,200 mg by mouth daily.    [provider]  omeprazole (PRILOSEC) 20 MG capsule Take 20 mg by mouth daily.    [provider]  polyvinyl alcohol-povidone (REFRESH) 1.4-0.6 % ophthalmic solution Place 1-2 drops into both eyes as needed (for dry eyes).    [provider]  valsartan (DIOVAN) 80 MG tablet Take 80 mg by mouth daily.    [provider]    Family History No family history on file.  Social History Social History  Substance Use Topics  . Smoking status: Never Smoker  . Smokeless tobacco: Never Used  . Alcohol use Not on file     Allergies   Patient has no known allergies.   Review of Systems Review of Systems  Constitutional: Negative for chills and fever.  Musculoskeletal: Negative for arthralgias, joint swelling and myalgias.  Skin: Positive for color change.  Cellulitis of the right hand.   Neurological: Negative for weakness and numbness.  Psychiatric/Behavioral: Negative for self-injury.     Physical Exam Updated Vital Signs BP 130/83 (BP Location: Left Arm)   Pulse 74   Temp 98.1 F (36.7 C) (Oral)   Resp 15   Ht 5\' 10"  (1.778 m)   Wt 69.5 kg (153 lb 2 oz)   SpO2 99%   BMI 21.97 kg/m   Physical Exam  Constitutional: He appears well-developed and well-nourished. No distress.  HENT:  Head: Normocephalic and atraumatic.  Neck: Neck supple.  Pulmonary/Chest:  Effort normal.  Musculoskeletal:  Right hand/wrist, full active range of movement without pain.  Surgical marking noted.  Mild erythema well within the lines.  Please see photo below.    Neurological: He is alert.  Skin: He is not diaphoretic.  Nursing note and vitals reviewed.      ED Treatments / Results  DIAGNOSTIC STUDIES: Oxygen Saturation is 99% on RA, normal by my interpretation.    COORDINATION OF CARE: 10:53 AM Discussed treatment plan with pt at bedside and pt agreed to plan.  Labs (all labs ordered are listed, but only abnormal results are displayed) Labs Reviewed - No data to display  EKG  EKG Interpretation None       Radiology Dg Wrist Complete Right  Result Date: 06/17/2017 CLINICAL DATA:  Right hand/wrist pain and redness since this morning. No known injury. EXAM: RIGHT WRIST - COMPLETE 3+ VIEW COMPARISON:  None. FINDINGS: There is no evidence of fracture or dislocation. Chondrocalcinosis of the triangular fibrocartilage. Multifocal cystic change throughout the carpal bones with joint space narrowing at the scaphotrapezium and trapezoid. Advanced osteoarthritis at the base of the thumb at the carpal metacarpal joint. Minimal irregularity of the in distal ulna styloid is nonspecific. Osteophytes of the ulna at the distal radioulnar joint. No focal soft tissue abnormality. IMPRESSION: Chondrocalcinosis. Multifocal cystic change throughout the carpal bones which are likely degenerative degenerative, less likely secondary to crystalline arthropathy. There is osteoarthritis, prominent involving the base of the thumb. Electronically Signed   By: Jeb Levering M.D.   On: 06/17/2017 22:23   Dg Hand Complete Right  Result Date: 06/17/2017 CLINICAL DATA:  Pt c/o pain and redness on posterior right wrist and hand since this morning. Pt denies injury. EXAM: RIGHT HAND - COMPLETE 3+ VIEW COMPARISON:  None. FINDINGS: Degenerative changes are seen throughout the carpal bones,  and at the first [facility] joint, moderate in degree, most compatible with osteoarthritis. Osseous lucencies within the carpal bones and about the first [facility] joint are likely chronic degenerative subchondral cysts, with erosions considered less likely, again suggesting underlying degenerative osteoarthritis. There is associated radial subluxation of the first metacarpal bone. No acute appearing osseous abnormality. Soft tissues about the right hand are unremarkable. IMPRESSION: 1. No acute findings. 2. Degenerative osteoarthritis, as detailed above Electronically Signed   By: Franki Cabot M.D.   On: 06/17/2017 22:18    Procedures Procedures (including critical care time)  Medications Ordered in ED Medications - No data to display   Initial Impression / Assessment and Plan / ED Course  I have reviewed the triage vital signs and the nursing notes.  Pertinent labs & imaging results that were available during my care of the patient were reviewed by me and considered in my medical decision making (see chart for details).     Afebrile, nontoxic patient with right hand/wrist cellulitis, seen and diagnosed in ED yesterday.  Pt returned  today as instructed for recheck.  He appears and states he is much improved.  Advised to continue course of antibiotics until finished.   D/C home with PCP f/u, ED return precautions.   Discussed result, findings, treatment, and follow up  with patient.  Pt given return precautions.  Pt verbalizes understanding and agrees with plan.       Final Clinical Impressions(s) / ED Diagnoses   Final diagnoses:  Cellulitis of right upper extremity    New Prescriptions Discharge Medication List as of 06/18/2017 10:50 AM     I personally performed the services described in this documentation, which was scribed in my presence. The recorded information has been reviewed and is accurate.    Clayton Bibles, PA-C 06/18/17 1126    Charlesetta Shanks, MD 07/03/17 1454

## 2017-06-18 NOTE — ED Notes (Signed)
Bed: [facility]PT2 Expected date:  Expected time:  Means of arrival:  Comments: 

## 2017-08-07 DIAGNOSIS — L570 Actinic keratosis: Secondary | ICD-10-CM | POA: Diagnosis not present

## 2017-08-07 DIAGNOSIS — L910 Hypertrophic scar: Secondary | ICD-10-CM | POA: Diagnosis not present

## 2017-08-21 DIAGNOSIS — R42 Dizziness and giddiness: Secondary | ICD-10-CM | POA: Diagnosis not present

## 2017-08-21 DIAGNOSIS — Z7901 Long term (current) use of anticoagulants: Secondary | ICD-10-CM | POA: Diagnosis not present

## 2017-08-21 DIAGNOSIS — I10 Essential (primary) hypertension: Secondary | ICD-10-CM | POA: Diagnosis not present

## 2017-08-21 DIAGNOSIS — Z6823 Body mass index (BMI) 23.0-23.9, adult: Secondary | ICD-10-CM | POA: Diagnosis not present

## 2017-08-21 DIAGNOSIS — I4891 Unspecified atrial fibrillation: Secondary | ICD-10-CM | POA: Diagnosis not present

## 2017-08-21 DIAGNOSIS — H6123 Impacted cerumen, bilateral: Secondary | ICD-10-CM | POA: Diagnosis not present

## 2017-09-06 DIAGNOSIS — I10 Essential (primary) hypertension: Secondary | ICD-10-CM | POA: Diagnosis not present

## 2017-09-06 DIAGNOSIS — H8110 Benign paroxysmal vertigo, unspecified ear: Secondary | ICD-10-CM | POA: Diagnosis not present

## 2017-09-06 DIAGNOSIS — Z7901 Long term (current) use of anticoagulants: Secondary | ICD-10-CM | POA: Diagnosis not present

## 2017-09-06 DIAGNOSIS — I4891 Unspecified atrial fibrillation: Secondary | ICD-10-CM | POA: Diagnosis not present

## 2017-09-06 DIAGNOSIS — Z6823 Body mass index (BMI) 23.0-23.9, adult: Secondary | ICD-10-CM | POA: Diagnosis not present

## 2017-10-09 DIAGNOSIS — Z23 Encounter for immunization: Secondary | ICD-10-CM | POA: Diagnosis not present

## 2017-11-07 DIAGNOSIS — I4891 Unspecified atrial fibrillation: Secondary | ICD-10-CM | POA: Diagnosis not present

## 2017-11-07 DIAGNOSIS — I10 Essential (primary) hypertension: Secondary | ICD-10-CM | POA: Diagnosis not present

## 2017-11-07 DIAGNOSIS — Z6824 Body mass index (BMI) 24.0-24.9, adult: Secondary | ICD-10-CM | POA: Diagnosis not present

## 2017-11-07 DIAGNOSIS — H8113 Benign paroxysmal vertigo, bilateral: Secondary | ICD-10-CM | POA: Diagnosis not present

## 2017-11-07 DIAGNOSIS — R7301 Impaired fasting glucose: Secondary | ICD-10-CM | POA: Diagnosis not present

## 2018-02-05 DIAGNOSIS — Z85828 Personal history of other malignant neoplasm of skin: Secondary | ICD-10-CM | POA: Diagnosis not present

## 2018-02-05 DIAGNOSIS — L218 Other seborrheic dermatitis: Secondary | ICD-10-CM | POA: Diagnosis not present

## 2018-02-05 DIAGNOSIS — L570 Actinic keratosis: Secondary | ICD-10-CM | POA: Diagnosis not present

## 2018-03-07 DIAGNOSIS — H353132 Nonexudative age-related macular degeneration, bilateral, intermediate dry stage: Secondary | ICD-10-CM | POA: Diagnosis not present

## 2018-03-07 DIAGNOSIS — H35372 Puckering of macula, left eye: Secondary | ICD-10-CM | POA: Diagnosis not present

## 2018-03-07 DIAGNOSIS — H43813 Vitreous degeneration, bilateral: Secondary | ICD-10-CM | POA: Diagnosis not present

## 2018-03-07 DIAGNOSIS — Z961 Presence of intraocular lens: Secondary | ICD-10-CM | POA: Diagnosis not present

## 2018-06-27 DIAGNOSIS — M109 Gout, unspecified: Secondary | ICD-10-CM | POA: Diagnosis not present

## 2018-06-27 DIAGNOSIS — E7849 Other hyperlipidemia: Secondary | ICD-10-CM | POA: Diagnosis not present

## 2018-06-27 DIAGNOSIS — R82998 Other abnormal findings in urine: Secondary | ICD-10-CM | POA: Diagnosis not present

## 2018-06-27 DIAGNOSIS — M859 Disorder of bone density and structure, unspecified: Secondary | ICD-10-CM | POA: Diagnosis not present

## 2018-06-27 DIAGNOSIS — Z125 Encounter for screening for malignant neoplasm of prostate: Secondary | ICD-10-CM | POA: Diagnosis not present

## 2018-06-27 DIAGNOSIS — I10 Essential (primary) hypertension: Secondary | ICD-10-CM | POA: Diagnosis not present

## 2018-06-27 DIAGNOSIS — R7301 Impaired fasting glucose: Secondary | ICD-10-CM | POA: Diagnosis not present

## 2018-07-10 DIAGNOSIS — Z6823 Body mass index (BMI) 23.0-23.9, adult: Secondary | ICD-10-CM | POA: Diagnosis not present

## 2018-07-10 DIAGNOSIS — Z7901 Long term (current) use of anticoagulants: Secondary | ICD-10-CM | POA: Diagnosis not present

## 2018-07-10 DIAGNOSIS — R011 Cardiac murmur, unspecified: Secondary | ICD-10-CM | POA: Diagnosis not present

## 2018-07-10 DIAGNOSIS — I440 Atrioventricular block, first degree: Secondary | ICD-10-CM | POA: Diagnosis not present

## 2018-07-10 DIAGNOSIS — Z0000 Encounter for general adult medical examination without abnormal findings: Secondary | ICD-10-CM | POA: Diagnosis not present

## 2018-07-10 DIAGNOSIS — M109 Gout, unspecified: Secondary | ICD-10-CM | POA: Diagnosis not present

## 2018-07-10 DIAGNOSIS — Z1389 Encounter for screening for other disorder: Secondary | ICD-10-CM | POA: Diagnosis not present

## 2018-07-10 DIAGNOSIS — I4891 Unspecified atrial fibrillation: Secondary | ICD-10-CM | POA: Diagnosis not present

## 2018-07-10 DIAGNOSIS — R808 Other proteinuria: Secondary | ICD-10-CM | POA: Diagnosis not present

## 2018-07-10 DIAGNOSIS — N183 Chronic kidney disease, stage 3 (moderate): Secondary | ICD-10-CM | POA: Diagnosis not present

## 2018-07-10 DIAGNOSIS — E875 Hyperkalemia: Secondary | ICD-10-CM | POA: Diagnosis not present

## 2018-07-10 DIAGNOSIS — M859 Disorder of bone density and structure, unspecified: Secondary | ICD-10-CM | POA: Diagnosis not present

## 2018-08-15 DIAGNOSIS — L819 Disorder of pigmentation, unspecified: Secondary | ICD-10-CM | POA: Diagnosis not present

## 2018-08-16 DIAGNOSIS — Z23 Encounter for immunization: Secondary | ICD-10-CM | POA: Diagnosis not present

## 2018-09-05 DIAGNOSIS — N183 Chronic kidney disease, stage 3 (moderate): Secondary | ICD-10-CM | POA: Diagnosis not present

## 2018-09-12 DIAGNOSIS — H01003 Unspecified blepharitis right eye, unspecified eyelid: Secondary | ICD-10-CM | POA: Diagnosis not present

## 2018-09-12 DIAGNOSIS — H1013 Acute atopic conjunctivitis, bilateral: Secondary | ICD-10-CM | POA: Diagnosis not present

## 2018-09-12 DIAGNOSIS — H16143 Punctate keratitis, bilateral: Secondary | ICD-10-CM | POA: Diagnosis not present

## 2018-10-11 DIAGNOSIS — Z6823 Body mass index (BMI) 23.0-23.9, adult: Secondary | ICD-10-CM | POA: Diagnosis not present

## 2018-10-11 DIAGNOSIS — I10 Essential (primary) hypertension: Secondary | ICD-10-CM | POA: Diagnosis not present

## 2018-10-11 DIAGNOSIS — N183 Chronic kidney disease, stage 3 (moderate): Secondary | ICD-10-CM | POA: Diagnosis not present

## 2018-10-11 DIAGNOSIS — I4891 Unspecified atrial fibrillation: Secondary | ICD-10-CM | POA: Diagnosis not present

## 2018-10-11 DIAGNOSIS — J029 Acute pharyngitis, unspecified: Secondary | ICD-10-CM | POA: Diagnosis not present

## 2018-10-11 DIAGNOSIS — J302 Other seasonal allergic rhinitis: Secondary | ICD-10-CM | POA: Diagnosis not present

## 2018-10-14 ENCOUNTER — Other Ambulatory Visit: Payer: Self-pay

## 2018-10-14 ENCOUNTER — Inpatient Hospital Stay (HOSPITAL_COMMUNITY)
Admission: EM | Admit: 2018-10-14 | Discharge: 2018-10-17 | DRG: 253 | Disposition: A | Discharge status: Home / Self Care | Payer: Medicare Other | Attending: Surgery | Admitting: Surgery

## 2018-10-14 ENCOUNTER — Encounter (HOSPITAL_COMMUNITY): Payer: Self-pay

## 2018-10-14 ENCOUNTER — Emergency Department (HOSPITAL_COMMUNITY): Payer: Medicare Other | Admitting: Anesthesiology

## 2018-10-14 ENCOUNTER — Encounter (HOSPITAL_COMMUNITY)
Admission: EM | Disposition: A | Discharge status: Home / Self Care | Payer: Self-pay | Source: Home / Self Care | Attending: Surgery

## 2018-10-14 DIAGNOSIS — E860 Dehydration: Secondary | ICD-10-CM | POA: Diagnosis present

## 2018-10-14 DIAGNOSIS — Z7901 Long term (current) use of anticoagulants: Secondary | ICD-10-CM | POA: Diagnosis not present

## 2018-10-14 DIAGNOSIS — T3695XA Adverse effect of unspecified systemic antibiotic, initial encounter: Secondary | ICD-10-CM | POA: Diagnosis present

## 2018-10-14 DIAGNOSIS — E785 Hyperlipidemia, unspecified: Secondary | ICD-10-CM | POA: Diagnosis present

## 2018-10-14 DIAGNOSIS — I482 Chronic atrial fibrillation, unspecified: Secondary | ICD-10-CM | POA: Diagnosis not present

## 2018-10-14 DIAGNOSIS — Z9181 History of falling: Secondary | ICD-10-CM | POA: Diagnosis not present

## 2018-10-14 DIAGNOSIS — E876 Hypokalemia: Secondary | ICD-10-CM | POA: Diagnosis not present

## 2018-10-14 DIAGNOSIS — I4519 Other right bundle-branch block: Secondary | ICD-10-CM | POA: Diagnosis present

## 2018-10-14 DIAGNOSIS — I739 Peripheral vascular disease, unspecified: Secondary | ICD-10-CM | POA: Diagnosis present

## 2018-10-14 DIAGNOSIS — Z79899 Other long term (current) drug therapy: Secondary | ICD-10-CM | POA: Diagnosis not present

## 2018-10-14 DIAGNOSIS — I4821 Permanent atrial fibrillation: Secondary | ICD-10-CM | POA: Diagnosis present

## 2018-10-14 DIAGNOSIS — Z9841 Cataract extraction status, right eye: Secondary | ICD-10-CM | POA: Diagnosis not present

## 2018-10-14 DIAGNOSIS — I361 Nonrheumatic tricuspid (valve) insufficiency: Secondary | ICD-10-CM | POA: Diagnosis not present

## 2018-10-14 DIAGNOSIS — R52 Pain, unspecified: Secondary | ICD-10-CM | POA: Diagnosis not present

## 2018-10-14 DIAGNOSIS — Z961 Presence of intraocular lens: Secondary | ICD-10-CM | POA: Diagnosis present

## 2018-10-14 DIAGNOSIS — I340 Nonrheumatic mitral (valve) insufficiency: Secondary | ICD-10-CM | POA: Diagnosis present

## 2018-10-14 DIAGNOSIS — K521 Toxic gastroenteritis and colitis: Secondary | ICD-10-CM | POA: Diagnosis present

## 2018-10-14 DIAGNOSIS — I10 Essential (primary) hypertension: Secondary | ICD-10-CM | POA: Diagnosis present

## 2018-10-14 DIAGNOSIS — I743 Embolism and thrombosis of arteries of the lower extremities: Principal | ICD-10-CM | POA: Diagnosis present

## 2018-10-14 DIAGNOSIS — I998 Other disorder of circulatory system: Secondary | ICD-10-CM

## 2018-10-14 DIAGNOSIS — R262 Difficulty in walking, not elsewhere classified: Secondary | ICD-10-CM | POA: Diagnosis not present

## 2018-10-14 DIAGNOSIS — N179 Acute kidney failure, unspecified: Secondary | ICD-10-CM | POA: Diagnosis present

## 2018-10-14 DIAGNOSIS — M79605 Pain in left leg: Secondary | ICD-10-CM | POA: Diagnosis not present

## 2018-10-14 DIAGNOSIS — Z419 Encounter for procedure for purposes other than remedying health state, unspecified: Secondary | ICD-10-CM

## 2018-10-14 HISTORY — DX: Nonrheumatic mitral (valve) insufficiency: I34.0

## 2018-10-14 HISTORY — PX: THROMBECTOMY FEMORAL ARTERY: SHX6406

## 2018-10-14 HISTORY — DX: Chronic atrial fibrillation, unspecified: I48.20

## 2018-10-14 LAB — BASIC METABOLIC PANEL
ANION GAP: 14 (ref 5–15)
Anion gap: 17 — ABNORMAL HIGH (ref 5–15)
BUN: 66 mg/dL — ABNORMAL HIGH (ref 8–23)
BUN: 51 mg/dL — ABNORMAL HIGH (ref 8–23)
CALCIUM: 7.2 mg/dL — AB (ref 8.9–10.3)
CHLORIDE: 100 mmol/L (ref 98–111)
CHLORIDE: 99 mmol/L (ref 98–111)
CO2: 20 mmol/L — AB (ref 22–32)
CO2: 18 mmol/L — ABNORMAL LOW (ref 22–32)
CREATININE: 3.32 mg/dL — AB (ref 0.61–1.24)
CREATININE: 2.4 mg/dL — AB (ref 0.61–1.24)
Calcium: 7 mg/dL — ABNORMAL LOW (ref 8.9–10.3)
GFR calc Af Amer: 17 mL/min — ABNORMAL LOW (ref >60)
GFR calc non Af Amer: 15 mL/min — ABNORMAL LOW (ref >60)
GFR calc non Af Amer: 22 mL/min — ABNORMAL LOW (ref >60)
GFR, EST AFRICAN AMERICAN: 26 mL/min — AB (ref >60)
GLUCOSE: 124 mg/dL — AB (ref 70–99)
GLUCOSE: 102 mg/dL — AB (ref 70–99)
Potassium: 3 mmol/L — ABNORMAL LOW (ref 3.5–5.1)
Potassium: 2.8 mmol/L — ABNORMAL LOW (ref 3.5–5.1)
Sodium: 134 mmol/L — ABNORMAL LOW (ref 135–145)
Sodium: 134 mmol/L — ABNORMAL LOW (ref 135–145)

## 2018-10-14 LAB — I-STAT CHEM 8, ED
BUN: 63 mg/dL — ABNORMAL HIGH (ref 8–23)
BUN: 63 mg/dL — ABNORMAL HIGH (ref 8–23)
CHLORIDE: 96 mmol/L — AB (ref 98–111)
Calcium, Ion: 0.94 mmol/L — ABNORMAL LOW (ref 1.15–1.40)
Calcium, Ion: 0.9 mmol/L — ABNORMAL LOW (ref 1.15–1.40)
Chloride: 98 mmol/L (ref 98–111)
Creatinine, Ser: 4.4 mg/dL — ABNORMAL HIGH (ref 0.61–1.24)
Creatinine, Ser: 4.3 mg/dL — ABNORMAL HIGH (ref 0.61–1.24)
GLUCOSE: 127 mg/dL — AB (ref 70–99)
Glucose, Bld: 132 mg/dL — ABNORMAL HIGH (ref 70–99)
HCT: 43 % (ref 39.0–52.0)
HEMATOCRIT: 40 % (ref 39.0–52.0)
HEMOGLOBIN: 14.6 g/dL (ref 13.0–17.0)
HEMOGLOBIN: 13.6 g/dL (ref 13.0–17.0)
POTASSIUM: 2.5 mmol/L — AB (ref 3.5–5.1)
POTASSIUM: 2.6 mmol/L — AB (ref 3.5–5.1)
SODIUM: 131 mmol/L — AB (ref 135–145)
SODIUM: 131 mmol/L — AB (ref 135–145)
TCO2: 19 mmol/L — ABNORMAL LOW (ref 22–32)
TCO2: 18 mmol/L — ABNORMAL LOW (ref 22–32)

## 2018-10-14 LAB — CBC
HEMATOCRIT: 34.3 % — AB (ref 39.0–52.0)
HEMATOCRIT: 32.3 % — AB (ref 39.0–52.0)
HEMOGLOBIN: 11.3 g/dL — AB (ref 13.0–17.0)
HEMOGLOBIN: 10.9 g/dL — AB (ref 13.0–17.0)
MCH: 27.2 pg (ref 26.0–34.0)
MCH: 27.3 pg (ref 26.0–34.0)
MCHC: 32.9 g/dL (ref 30.0–36.0)
MCHC: 33.7 g/dL (ref 30.0–36.0)
MCV: 82.5 fL (ref 80.0–100.0)
MCV: 80.8 fL (ref 80.0–100.0)
Platelets: 110 10*3/uL — ABNORMAL LOW (ref 150–400)
Platelets: 104 10*3/uL — ABNORMAL LOW (ref 150–400)
RBC: 4.16 MIL/uL — ABNORMAL LOW (ref 4.22–5.81)
RBC: 4 MIL/uL — ABNORMAL LOW (ref 4.22–5.81)
RDW: 14.7 % (ref 11.5–15.5)
RDW: 14.9 % (ref 11.5–15.5)
WBC: 13.8 10*3/uL — ABNORMAL HIGH (ref 4.0–10.5)
WBC: 9.6 10*3/uL (ref 4.0–10.5)
nRBC: 0 % (ref 0.0–0.2)
nRBC: 0 % (ref 0.0–0.2)

## 2018-10-14 LAB — TYPE AND SCREEN
ABO/RH(D): O POS
ANTIBODY SCREEN: NEGATIVE

## 2018-10-14 LAB — CREATININE, SERUM
CREATININE: 4.04 mg/dL — AB (ref 0.61–1.24)
GFR calc Af Amer: 14 mL/min — ABNORMAL LOW (ref >60)
GFR calc non Af Amer: 12 mL/min — ABNORMAL LOW (ref >60)

## 2018-10-14 LAB — CBC WITH DIFFERENTIAL/PLATELET
ABS IMMATURE GRANULOCYTES: 0.76 10*3/uL — AB (ref 0.00–0.07)
BASOS PCT: 0 %
Basophils Absolute: 0 10*3/uL (ref 0.0–0.1)
Eosinophils Absolute: 0.1 10*3/uL (ref 0.0–0.5)
Eosinophils Relative: 1 %
HCT: 40.3 % (ref 39.0–52.0)
HEMOGLOBIN: 13.3 g/dL (ref 13.0–17.0)
IMMATURE GRANULOCYTES: 5 %
LYMPHS PCT: 10 %
Lymphs Abs: 1.6 10*3/uL (ref 0.7–4.0)
MCH: 26.9 pg (ref 26.0–34.0)
MCHC: 33 g/dL (ref 30.0–36.0)
MCV: 81.4 fL (ref 80.0–100.0)
MONO ABS: 0.9 10*3/uL (ref 0.1–1.0)
Monocytes Relative: 6 %
NEUTROS ABS: 12.4 10*3/uL — AB (ref 1.7–7.7)
NEUTROS PCT: 78 %
PLATELETS: 151 10*3/uL (ref 150–400)
RBC: 4.95 MIL/uL (ref 4.22–5.81)
RDW: 14.6 % (ref 11.5–15.5)
WBC: 15.7 10*3/uL — AB (ref 4.0–10.5)
nRBC: 0 % (ref 0.0–0.2)

## 2018-10-14 LAB — APTT: APTT: 99 s — AB (ref 24–36)

## 2018-10-14 LAB — ABO/RH: ABO/RH(D): O POS

## 2018-10-14 LAB — HEPARIN LEVEL (UNFRACTIONATED): Heparin Unfractionated: 0.2 IU/mL — ABNORMAL LOW (ref 0.30–0.70)

## 2018-10-14 SURGERY — THROMBECTOMY, ARTERY, FEMORAL
Anesthesia: General | Laterality: Left

## 2018-10-14 MED ORDER — FENTANYL CITRATE (PF) 100 MCG/2ML IJ SOLN
INTRAMUSCULAR | Status: DC | PRN
  Administered 2018-10-14 (×4): 25 ug via INTRAVENOUS

## 2018-10-14 MED ORDER — SODIUM CHLORIDE 0.9 % IV SOLN
INTRAVENOUS | Status: DC
  Administered 2018-10-14 – 2018-10-17 (×6): via INTRAVENOUS

## 2018-10-14 MED ORDER — FENTANYL CITRATE (PF) 100 MCG/2ML IJ SOLN
50.0000 ug | INTRAMUSCULAR | Status: AC | PRN
  Administered 2018-10-14 (×2): 50 ug via INTRAVENOUS

## 2018-10-14 MED ORDER — HYDRALAZINE HCL 20 MG/ML IJ SOLN: 5.0000 mg | INTRAMUSCULAR | Status: DC | PRN

## 2018-10-14 MED ORDER — ONDANSETRON HCL 4 MG/2ML IJ SOLN
INTRAMUSCULAR | Status: DC | PRN
  Administered 2018-10-14: 4 mg via INTRAVENOUS

## 2018-10-14 MED ORDER — CEFAZOLIN SODIUM-DEXTROSE 2-3 GM-%(50ML) IV SOLR
INTRAVENOUS | Status: DC | PRN
  Administered 2018-10-14: 2 g via INTRAVENOUS

## 2018-10-14 MED ORDER — MORPHINE SULFATE (PF) 2 MG/ML IV SOLN: 0.5000 mg | INTRAVENOUS | Status: DC | PRN

## 2018-10-14 MED ORDER — SUGAMMADEX SODIUM 200 MG/2ML IV SOLN
INTRAVENOUS | Status: DC | PRN
  Administered 2018-10-14: 200 mg via INTRAVENOUS

## 2018-10-14 MED ORDER — ALBUMIN HUMAN 5 % IV SOLN
INTRAVENOUS | Status: AC
  Filled 2018-10-14: qty 250

## 2018-10-14 MED ORDER — SODIUM CHLORIDE 0.9 % IV SOLN: INTRAVENOUS | Status: DC | PRN

## 2018-10-14 MED ORDER — OXYCODONE HCL 5 MG PO TABS: 5.0000 mg | ORAL_TABLET | ORAL | Status: DC | PRN

## 2018-10-14 MED ORDER — METOPROLOL TARTRATE 5 MG/5ML IV SOLN: 2.0000 mg | INTRAVENOUS | Status: DC | PRN

## 2018-10-14 MED ORDER — ROCURONIUM BROMIDE 50 MG/5ML IV SOSY
PREFILLED_SYRINGE | INTRAVENOUS | Status: AC
  Filled 2018-10-14: qty 5

## 2018-10-14 MED ORDER — PANTOPRAZOLE SODIUM 40 MG PO TBEC
40.0000 mg | DELAYED_RELEASE_TABLET | Freq: Every day | ORAL | Status: DC
  Administered 2018-10-14 – 2018-10-17 (×4): 40 mg via ORAL
  Filled 2018-10-14 (×4): qty 1

## 2018-10-14 MED ORDER — PROTAMINE SULFATE 10 MG/ML IV SOLN
INTRAVENOUS | Status: DC | PRN
  Administered 2018-10-14: 5 mg via INTRAVENOUS
  Administered 2018-10-14: 10 mg via INTRAVENOUS
  Administered 2018-10-14 (×2): 5 mg via INTRAVENOUS

## 2018-10-14 MED ORDER — SODIUM CHLORIDE 0.9 % IV SOLN
INTRAVENOUS | Status: AC
  Filled 2018-10-14: qty 1.2

## 2018-10-14 MED ORDER — ROCURONIUM BROMIDE 50 MG/5ML IV SOSY
PREFILLED_SYRINGE | INTRAVENOUS | Status: DC | PRN
  Administered 2018-10-14: 50 mg via INTRAVENOUS

## 2018-10-14 MED ORDER — PROPOFOL 10 MG/ML IV BOLUS
INTRAVENOUS | Status: AC
  Filled 2018-10-14: qty 20

## 2018-10-14 MED ORDER — HEMOSTATIC AGENTS (NO CHARGE) OPTIME
TOPICAL | Status: DC | PRN
  Administered 2018-10-14: 1 via TOPICAL

## 2018-10-14 MED ORDER — LACTATED RINGERS IV BOLUS
1000.0000 mL | Freq: Once | INTRAVENOUS | Status: AC
  Administered 2018-10-14: 1000 mL via INTRAVENOUS

## 2018-10-14 MED ORDER — PROPOFOL 10 MG/ML IV BOLUS
INTRAVENOUS | Status: DC | PRN
  Administered 2018-10-14: 100 mg via INTRAVENOUS

## 2018-10-14 MED ORDER — LIDOCAINE 2% (20 MG/ML) 5 ML SYRINGE
INTRAMUSCULAR | Status: DC | PRN
  Administered 2018-10-14: 40 mg via INTRAVENOUS

## 2018-10-14 MED ORDER — IRBESARTAN 75 MG PO TABS
75.0000 mg | ORAL_TABLET | Freq: Every day | ORAL | Status: DC
  Administered 2018-10-14 – 2018-10-17 (×4): 75 mg via ORAL
  Filled 2018-10-14 (×4): qty 1

## 2018-10-14 MED ORDER — HEPARIN SODIUM (PORCINE) 1000 UNIT/ML IJ SOLN
INTRAMUSCULAR | Status: AC
  Filled 2018-10-14: qty 1

## 2018-10-14 MED ORDER — ALBUMIN HUMAN 5 % IV SOLN
12.5000 g | Freq: Once | INTRAVENOUS | Status: AC
  Administered 2018-10-14: 12.5 g via INTRAVENOUS

## 2018-10-14 MED ORDER — SODIUM CHLORIDE 0.9 % IV SOLN
INTRAVENOUS | Status: DC | PRN
  Administered 2018-10-14: 500 mL

## 2018-10-14 MED ORDER — POTASSIUM CHLORIDE CRYS ER 20 MEQ PO TBCR: 20.0000 meq | EXTENDED_RELEASE_TABLET | Freq: Every day | ORAL | Status: DC | PRN

## 2018-10-14 MED ORDER — GUAIFENESIN-DM 100-10 MG/5ML PO SYRP: 15.0000 mL | ORAL_SOLUTION | ORAL | Status: DC | PRN

## 2018-10-14 MED ORDER — DOCUSATE SODIUM 100 MG PO CAPS
100.0000 mg | ORAL_CAPSULE | Freq: Every day | ORAL | Status: DC
  Filled 2018-10-14: qty 1

## 2018-10-14 MED ORDER — POTASSIUM CHLORIDE 10 MEQ/100ML IV SOLN
10.0000 meq | INTRAVENOUS | Status: DC
  Administered 2018-10-14: 10 meq via INTRAVENOUS
  Filled 2018-10-14: qty 100

## 2018-10-14 MED ORDER — HYDROCODONE-ACETAMINOPHEN 5-325 MG PO TABS
1.0000 | ORAL_TABLET | Freq: Four times a day (QID) | ORAL | Status: DC | PRN
  Administered 2018-10-14 – 2018-10-15 (×2): 1 via ORAL
  Filled 2018-10-14 (×2): qty 1

## 2018-10-14 MED ORDER — SODIUM CHLORIDE 0.9 % IV SOLN
INTRAVENOUS | Status: DC | PRN
  Administered 2018-10-14: 50 ug/min via INTRAVENOUS

## 2018-10-14 MED ORDER — FENTANYL CITRATE (PF) 100 MCG/2ML IJ SOLN
INTRAMUSCULAR | Status: AC
  Administered 2018-10-14: 50 ug via INTRAVENOUS
  Filled 2018-10-14: qty 2

## 2018-10-14 MED ORDER — ATORVASTATIN CALCIUM 40 MG PO TABS
40.0000 mg | ORAL_TABLET | Freq: Every day | ORAL | Status: DC
  Administered 2018-10-14 – 2018-10-17 (×4): 40 mg via ORAL
  Filled 2018-10-14 (×4): qty 1

## 2018-10-14 MED ORDER — POTASSIUM CHLORIDE 10 MEQ/100ML IV SOLN
10.0000 meq | INTRAVENOUS | Status: AC
  Administered 2018-10-14 (×2): 10 meq via INTRAVENOUS
  Filled 2018-10-14: qty 100

## 2018-10-14 MED ORDER — PROTAMINE SULFATE 10 MG/ML IV SOLN
INTRAVENOUS | Status: AC
  Filled 2018-10-14: qty 5

## 2018-10-14 MED ORDER — LABETALOL HCL 5 MG/ML IV SOLN: 10.0000 mg | INTRAVENOUS | Status: DC | PRN

## 2018-10-14 MED ORDER — PHENOL 1.4 % MT LIQD
1.0000 | OROMUCOSAL | Status: DC | PRN
  Filled 2018-10-14: qty 177

## 2018-10-14 MED ORDER — CEFAZOLIN SODIUM 1 G IJ SOLR
INTRAMUSCULAR | Status: AC
  Filled 2018-10-14: qty 20

## 2018-10-14 MED ORDER — HEPARIN (PORCINE) IN NACL 100-0.45 UNIT/ML-% IJ SOLN
1250.0000 [IU]/h | INTRAMUSCULAR | Status: DC
  Administered 2018-10-14: 1000 [IU]/h via INTRAVENOUS
  Administered 2018-10-15: 1100 [IU]/h via INTRAVENOUS
  Administered 2018-10-16 – 2018-10-17 (×2): 1250 [IU]/h via INTRAVENOUS
  Filled 2018-10-14 (×4): qty 250

## 2018-10-14 MED ORDER — BISACODYL 5 MG PO TBEC: 5.0000 mg | DELAYED_RELEASE_TABLET | Freq: Every day | ORAL | Status: DC | PRN

## 2018-10-14 MED ORDER — APIXABAN 5 MG PO TABS: 5.0000 mg | ORAL_TABLET | Freq: Two times a day (BID) | ORAL | Status: DC

## 2018-10-14 MED ORDER — LACTATED RINGERS IV SOLN
INTRAVENOUS | Status: DC | PRN
  Administered 2018-10-14 (×2): via INTRAVENOUS

## 2018-10-14 MED ORDER — FENTANYL CITRATE (PF) 100 MCG/2ML IJ SOLN: 25.0000 ug | INTRAMUSCULAR | Status: DC | PRN

## 2018-10-14 MED ORDER — FENTANYL CITRATE (PF) 250 MCG/5ML IJ SOLN
INTRAMUSCULAR | Status: AC
  Filled 2018-10-14: qty 5

## 2018-10-14 MED ORDER — SENNOSIDES-DOCUSATE SODIUM 8.6-50 MG PO TABS: 1.0000 | ORAL_TABLET | Freq: Every evening | ORAL | Status: DC | PRN

## 2018-10-14 MED ORDER — ONDANSETRON HCL 4 MG/2ML IJ SOLN: 4.0000 mg | Freq: Four times a day (QID) | INTRAMUSCULAR | Status: DC | PRN

## 2018-10-14 MED ORDER — SODIUM CHLORIDE 0.9 % IV SOLN
500.0000 mL | Freq: Once | INTRAVENOUS | Status: AC | PRN
  Administered 2018-10-14: 500 mL via INTRAVENOUS

## 2018-10-14 MED ORDER — MAGNESIUM SULFATE 2 GM/50ML IV SOLN: 2.0000 g | Freq: Every day | INTRAVENOUS | Status: DC | PRN

## 2018-10-14 MED ORDER — SODIUM CHLORIDE 0.9 % IV SOLN
INTRAVENOUS | Status: DC | PRN
  Administered 2018-10-14: 04:00:00 via INTRAVENOUS

## 2018-10-14 MED ORDER — LIDOCAINE 2% (20 MG/ML) 5 ML SYRINGE
INTRAMUSCULAR | Status: AC
  Filled 2018-10-14: qty 5

## 2018-10-14 MED ORDER — ONDANSETRON HCL 4 MG/2ML IJ SOLN
INTRAMUSCULAR | Status: AC
  Filled 2018-10-14: qty 2

## 2018-10-14 MED ORDER — 0.9 % SODIUM CHLORIDE (POUR BTL) OPTIME
TOPICAL | Status: DC | PRN
  Administered 2018-10-14: 1000 mL

## 2018-10-14 MED ORDER — ALUM & MAG HYDROXIDE-SIMETH 200-200-20 MG/5ML PO SUSP: 15.0000 mL | ORAL | Status: DC | PRN

## 2018-10-14 MED ORDER — CEFAZOLIN SODIUM-DEXTROSE 2-4 GM/100ML-% IV SOLN
2.0000 g | Freq: Three times a day (TID) | INTRAVENOUS | Status: AC
  Administered 2018-10-14: 2 g via INTRAVENOUS
  Filled 2018-10-14: qty 100

## 2018-10-14 MED ORDER — POLYVINYL ALCOHOL 1.4 % OP SOLN: 1.0000 [drp] | OPHTHALMIC | Status: DC | PRN

## 2018-10-14 MED ORDER — HEPARIN SODIUM (PORCINE) 1000 UNIT/ML IJ SOLN
INTRAMUSCULAR | Status: DC | PRN
  Administered 2018-10-14: 5000 [IU] via INTRAVENOUS

## 2018-10-14 SURGICAL SUPPLY — 54 items
BANDAGE ACE 4X5 VEL STRL LF (GAUZE/BANDAGES/DRESSINGS) IMPLANT
BANDAGE ESMARK 6X9 LF (GAUZE/BANDAGES/DRESSINGS) IMPLANT
BNDG ESMARK 6X9 LF (GAUZE/BANDAGES/DRESSINGS)
CANISTER SUCT 3000ML PPV (MISCELLANEOUS) ×3 IMPLANT
CLIP VESOCCLUDE MED 24/CT (CLIP) ×3 IMPLANT
CLIP VESOCCLUDE SM WIDE 24/CT (CLIP) ×3 IMPLANT
COVER WAND RF STERILE (DRAPES) ×3 IMPLANT
CUFF TOURNIQUET SINGLE 24IN (TOURNIQUET CUFF) IMPLANT
CUFF TOURNIQUET SINGLE 34IN LL (TOURNIQUET CUFF) IMPLANT
CUFF TOURNIQUET SINGLE 44IN (TOURNIQUET CUFF) IMPLANT
DERMABOND ADVANCED (GAUZE/BANDAGES/DRESSINGS) ×2
DERMABOND ADVANCED .7 DNX12 (GAUZE/BANDAGES/DRESSINGS) ×1 IMPLANT
DRAIN CHANNEL 15F RND FF W/TCR (WOUND CARE) IMPLANT
DRAPE X-RAY CASS 24X20 (DRAPES) IMPLANT
ELECT REM PT RETURN 9FT ADLT (ELECTROSURGICAL) ×3
ELECTRODE REM PT RTRN 9FT ADLT (ELECTROSURGICAL) ×1 IMPLANT
EVACUATOR SILICONE 100CC (DRAIN) IMPLANT
GLOVE BIOGEL PI IND STRL 6 (GLOVE) ×3 IMPLANT
GLOVE BIOGEL PI IND STRL 7.5 (GLOVE) ×3 IMPLANT
GLOVE BIOGEL PI INDICATOR 6 (GLOVE) ×6
GLOVE BIOGEL PI INDICATOR 7.5 (GLOVE) ×6
GLOVE ECLIPSE 7.0 STRL STRAW (GLOVE) ×3 IMPLANT
GLOVE SURG SS PI 7.5 STRL IVOR (GLOVE) ×6 IMPLANT
GOWN STRL REUS W/ TWL LRG LVL3 (GOWN DISPOSABLE) ×2 IMPLANT
GOWN STRL REUS W/ TWL XL LVL3 (GOWN DISPOSABLE) ×1 IMPLANT
GOWN STRL REUS W/TWL LRG LVL3 (GOWN DISPOSABLE) ×4
GOWN STRL REUS W/TWL XL LVL3 (GOWN DISPOSABLE) ×2
HEMOSTAT SNOW SURGICEL 2X4 (HEMOSTASIS) ×3 IMPLANT
KIT BASIN OR (CUSTOM PROCEDURE TRAY) ×3 IMPLANT
KIT TURNOVER KIT B (KITS) ×3 IMPLANT
MARKER GRAFT CORONARY BYPASS (MISCELLANEOUS) IMPLANT
NS IRRIG 1000ML POUR BTL (IV SOLUTION) ×6 IMPLANT
PACK PERIPHERAL VASCULAR (CUSTOM PROCEDURE TRAY) ×3 IMPLANT
PAD ARMBOARD 7.5X6 YLW CONV (MISCELLANEOUS) ×6 IMPLANT
SET COLLECT BLD 21X3/4 12 (NEEDLE) IMPLANT
STOPCOCK 4 WAY LG BORE MALE ST (IV SETS) IMPLANT
SUT ETHILON 3 0 PS 1 (SUTURE) IMPLANT
SUT PROLENE 5 0 C 1 24 (SUTURE) ×3 IMPLANT
SUT PROLENE 6 0 BV (SUTURE) ×3 IMPLANT
SUT PROLENE 6 0 C 1 24 (SUTURE) ×3 IMPLANT
SUT PROLENE 7 0 BV 1 (SUTURE) IMPLANT
SUT SILK 2 0 SH (SUTURE) IMPLANT
SUT SILK 3 0 (SUTURE)
SUT SILK 3-0 18XBRD TIE 12 (SUTURE) IMPLANT
SUT VIC AB 2-0 CT1 27 (SUTURE) ×2
SUT VIC AB 2-0 CT1 TAPERPNT 27 (SUTURE) ×1 IMPLANT
SUT VIC AB 3-0 SH 27 (SUTURE) ×2
SUT VIC AB 3-0 SH 27X BRD (SUTURE) ×1 IMPLANT
SUT VICRYL 4-0 PS2 18IN ABS (SUTURE) ×3 IMPLANT
TOWEL GREEN STERILE (TOWEL DISPOSABLE) ×3 IMPLANT
TRAY FOLEY MTR SLVR 16FR STAT (SET/KITS/TRAYS/PACK) IMPLANT
TUBING EXTENTION W/L.L. (IV SETS) IMPLANT
UNDERPAD 30X30 (UNDERPADS AND DIAPERS) ×3 IMPLANT
WATER STERILE IRR 1000ML POUR (IV SOLUTION) ×3 IMPLANT

## 2018-10-14 NOTE — Anesthesia Preprocedure Evaluation (Addendum)
Anesthesia Evaluation  Patient identified by MRN, date of birth, ID band Patient awake    Reviewed: Allergy & Precautions, NPO status , Patient's Chart, lab work & pertinent test results  Airway Mallampati: III  TM Distance: >3 FB Neck ROM: Full    Dental  (+) Teeth Intact, Dental Advisory Given   Pulmonary neg pulmonary ROS,    breath sounds clear to auscultation       Cardiovascular hypertension, Pt. on medications + dysrhythmias Atrial Fibrillation  Rhythm:Irregular Rate:Tachycardia     Neuro/Psych negative neurological ROS  negative psych ROS   GI/Hepatic Neg liver ROS, GERD  Medicated,  Endo/Other  negative endocrine ROS  Renal/GU negative Renal ROS     Musculoskeletal negative musculoskeletal ROS (+)   Abdominal Normal abdominal exam  (+)   Peds  Hematology negative hematology ROS (+)   Anesthesia Other Findings   Reproductive/Obstetrics                            Lab Results  Component Value Date   WBC 15.7 (H) 10/14/2018   HGB 13.6 10/14/2018   HCT 40.0 10/14/2018   MCV 81.4 10/14/2018   PLT 151 10/14/2018   Lab Results  Component Value Date   CREATININE 4.30 (H) 10/14/2018   BUN 63 (H) 10/14/2018   NA 131 (L) 10/14/2018   K 2.6 (LL) 10/14/2018   CL 98 10/14/2018   CO2 24 07/09/2014   Lab Results  Component Value Date   INR 1.0 07/22/2008   EKG: atrial fibrillation.   Anesthesia Physical Anesthesia Plan  ASA: III and emergent  Anesthesia Plan: General   Post-op Pain Management:    Induction: Intravenous  PONV Risk Score and Plan: 3 and Ondansetron and Treatment may vary due to age or medical condition  Airway Management Planned: Oral ETT  Additional Equipment: None  Intra-op Plan:   Post-operative Plan: Extubation in OR  Informed Consent: I have reviewed the patients History and Physical, chart, labs and discussed the procedure including the risks,  benefits and alternatives for the proposed anesthesia with the patient or authorized representative who has indicated his/her understanding and acceptance.   Dental advisory given  Plan Discussed with: CRNA  Anesthesia Plan Comments:        Anesthesia Quick Evaluation

## 2018-10-14 NOTE — ED Notes (Signed)
OR contacting primary nurse for report.

## 2018-10-14 NOTE — Op Note (Signed)
Patient name: George Potter MRN: [id number] DOB: [birth year] Sex: male  10/14/2018 Pre-operative Diagnosis: Ischemic left leg Post-operative diagnosis:  Same Surgeon:  Annamarie Major Assistants: Laurence Slate Procedure:   Iliofemoral thromboembolectomy, left leg Anesthesia: General Blood Loss: 100 cc Specimens: Thrombus was sent to pathology  Findings: Occlusive thrombus was seen in the common femoral artery at the bifurcation which was completely obstructive.  This was able to be removed with a Fogarty catheter.  Primary closure of a transverse arteriotomy was performed.  Indications: The patient presented to the emergency department earlier this evening with approximately a 2-hour history of severe left leg pain.  I saw him immediately.  He did not have motor or sensory function in the left foot which was cold and mottled.  He is on Eliquis for atrial fibrillation, however he has had a febrile illness which was thought to be strep throat however cultures were negative.  He is on antibiotics anyway and was having diarrhea, thus he was dehydrated.  I elected to take him emergently to the operating room.  This was discussed with the patient and son.  They understand the risk for possible delayed fasciotomy.  Procedure:  The patient was identified in the holding area and taken to [place] [facility]  The patient was then placed supine on the table. general anesthesia was administered.  The patient was prepped and draped in the usual sterile fashion.  A time out was called and antibiotics were administered.  A longitudinal incision was made in the left groin.  Cautery was used about subcutaneous tissue down the femoral sheath which was opened sharply.  The common femoral, profundofemoral, and superficial femoral artery were individually isolated and encircled with Vesseloops.  Heparin bolus was administered.  I then occluded the vessels with vascular clamps and made a transverse arteriotomy in the distal  common femoral artery with a 11 blade.  There was congealed thrombus present in the common femoral artery which I initially evacuated with DeBakey forceps.  I sent this for pathology because it did not appear acute.  I then passed a #4 Fogarty catheter up into the aorta without resistance and perform thrombectomy.  A small amount of clot was removed and so a second pass was performed which was negative.  There was excellent inflow.  I then passed the catheter down the profundofemoral artery and acute thrombus was evacuated.  A negative pass was performed and there was excellent backbleeding.  The profundus flushed with heparin saline and then occluded.  I then passed a #4 Fogarty catheter down the superficial femoral artery.  This went all the way and without resistance.  Acute thrombus was evacuated.  2- passes were performed.  The artery was flushed with heparin saline and occluded with a baby Gregory clamp.  I then closed the arteriotomy transversely with a running 5-0 Prolene.  Blood flow was then reestablished to the left leg.  He had a brisk posterior tibial and dorsalis pedis Doppler signal.  The wound was irrigated and hemostasis was achieved.  There was no active bleeding and so I did not reverse the heparin.  The femoral sheath was then reapproximated with 2-0 Vicryl.  Subcutaneous tissue was then closed with multiple layers of 3-0 Vicryl followed by 3-0 Vicryl in the subtenons tissue.  Dermabond was applied.  There were no immediate complications.  The patient was successfully extubated and taken recovery in stable condition.   Disposition: To PACU stable   V. Annamarie Major,  M.D. [facility] Office: [phone] Pager:  [phone]

## 2018-10-14 NOTE — Anesthesia Procedure Notes (Signed)
Procedure Name: Intubation Date/Time: 10/14/2018 4:31 AM Performed by: Suzy Bouchard, CRNA Pre-anesthesia Checklist: Patient identified, Emergency Drugs available, Suction available, Patient being monitored and Timeout performed Patient Re-evaluated:Patient Re-evaluated prior to induction Oxygen Delivery Method: Circle system utilized Preoxygenation: Pre-oxygenation with 100% oxygen Induction Type: IV induction Ventilation: Mask ventilation without difficulty Laryngoscope Size: Miller and 2 Grade View: Grade III Tube type: Oral Tube size: 7.5 mm Number of attempts: 1 Airway Equipment and Method: Stylet Placement Confirmation: ETT inserted through vocal cords under direct vision,  positive ETCO2 and breath sounds checked- equal and bilateral Secured at: 23 cm Tube secured with: Tape Dental Injury: Teeth and Oropharynx as per pre-operative assessment

## 2018-10-14 NOTE — ED Notes (Signed)
Vascular at bedside

## 2018-10-14 NOTE — H&P (Signed)
[facility]  Patient name: George Potter MRN: [id number] DOB: [birth year] Sex: male   REQUESTING PROVIDER:    ER    REASON FOR CONSULT:    Cold left leg  HISTORY OF PRESENT ILLNESS:   George Potter is a 84 y.o. male, who presented to the emergency department with acute onset of severe left leg pain.  He states that this woke him up around approximately 2 AM.  His son brought to the hospital.  He states that he cannot move his toes or have any feeling in his left foot.  There are no sensory or motor changes on the right.  The patient has a history of atrial fibrillation for which he takes Eliquis.  He is medically managed for hypertension.  He is a non-smoker.  The patient recently started taking medication and has had pretty significant diarrhea.  He states he is trying to drink water to stay hydrated.    PAST MEDICAL HISTORY    Past Medical History:  Diagnosis Date  . Atrial fibrillation ([place])   . Diverticulosis   . HTN (hypertension)    Mild     FAMILY HISTORY   History reviewed. No pertinent family history.  SOCIAL HISTORY:   Social History   Socioeconomic History  . Marital status: Widowed    Spouse name: Not on file  . Number of children: 1  . Years of education: Not on file  . Highest education level: Not on file  Occupational History  . Not on file  Social Needs  . Financial resource strain: Not on file  . Food insecurity:    Worry: Not on file    Inability: Not on file  . Transportation needs:    Medical: Not on file    Non-medical: Not on file  Tobacco Use  . Smoking status: Never Smoker  . Smokeless tobacco: Never Used  Substance and Sexual Activity  . Alcohol use: Not on file  . Drug use: Not on file  . Sexual activity: Not on file  Lifestyle  . Physical activity:    Days per week: Not on file    Minutes per session: Not on file  . Stress: Not on file  Relationships  .  Social connections:    Talks on phone: Not on file    Gets together: Not on file    Attends religious service: Not on file    Active member of club or organization: Not on file    Attends meetings of clubs or organizations: Not on file    Relationship status: Not on file  . Intimate partner violence:    Fear of current or ex partner: Not on file    Emotionally abused: Not on file    Physically abused: Not on file    Forced sexual activity: Not on file  Other Topics Concern  . Not on file  Social History Narrative   Worked at [employer] works.     ALLERGIES:    No Known Allergies  CURRENT MEDICATIONS:    Current Facility-Administered Medications  Medication Dose Route Frequency Provider Last Rate Last Dose  . lactated ringers bolus 1,000 mL  1,000 mL Intravenous Once Varney Biles, MD 1,935.5 mL/hr at 10/14/18 0329 1,000 mL at 10/14/18 0329   Current Outpatient Medications  Medication Sig Dispense Refill  . apixaban (ELIQUIS) 5 MG TABS tablet Take 5 mg by mouth 2 (two) times daily.    Marland Kitchen atorvastatin (LIPITOR)  40 MG tablet Take 40 mg by mouth daily.    . Cholecalciferol (VITAMIN D3) 1000 units CAPS Take 2,000 Units by mouth daily.    Marland Kitchen HYDROcodone-acetaminophen (NORCO/VICODIN) 5-325 MG tablet Take 1 tablet by mouth every 6 (six) hours as needed for severe pain. 5 tablet 0  . Omega-3 Fatty Acids (FISH OIL) 1200 MG CAPS Take 1,200 mg by mouth daily.    Marland Kitchen omeprazole (PRILOSEC) 20 MG capsule Take 20 mg by mouth daily.    . polyvinyl alcohol-povidone (REFRESH) 1.4-0.6 % ophthalmic solution Place 1-2 drops into both eyes as needed (for dry eyes).    . valsartan (DIOVAN) 80 MG tablet Take 80 mg by mouth daily.      REVIEW OF SYSTEMS:   .  Denotes positive finding, [ ]  denotes negative finding Cardiac  Comments:  Chest pain or chest pressure:    Shortness of breath upon exertion:    Short of breath when lying flat:    Irregular heart rhythm:        Vascular    Pain in calf,  thigh, or hip brought on by ambulation:    Pain in feet at night that wakes you up from your sleep:  x   Blood clot in your veins:    Leg swelling:         Pulmonary    Oxygen at home:    Productive cough:     Wheezing:         Neurologic    Sudden weakness in arms or legs:     Sudden numbness in arms or legs:     Sudden onset of difficulty speaking or slurred speech:    Temporary loss of vision in one eye:     Problems with dizziness:         Gastrointestinal    Blood in stool:      Vomited blood:         Genitourinary    Burning when urinating:     Blood in urine:        Psychiatric    Major depression:         Hematologic    Bleeding problems:    Problems with blood clotting too easily:        Skin    Rashes or ulcers:        Constitutional    Fever or chills:     PHYSICAL EXAM:   Vitals:   10/14/18 0256 10/14/18 0300 10/14/18 0317 10/14/18 0330  BP:  (!) 116/51 102/74 106/88  Pulse:  (!) 122 (!) 103 (!) 103  Resp:  18 16 (!) 22  Temp: 97.6 F (36.4 C)     TempSrc: Oral     SpO2:  99% 97% 98%  Weight:      Height:        GENERAL: The patient is a well-nourished male, in no acute distress. The vital signs are documented above. CARDIAC: There is a regular rate and rhythm.  VASCULAR: Brisk right posterior tibial Doppler signal.  Palpable femoral pulses bilaterally.  No Doppler signals noted in the left leg. PULMONARY: Nonlabored respirations ABDOMEN: Soft and non-tender with normal pitched bowel sounds.  MUSCULOSKELETAL: There are no major deformities or cyanosis. NEUROLOGIC: No motor or sensory function in the left foot.  He regained sensation of near the knee. SKIN: There are no ulcers or rashes noted. PSYCHIATRIC: The patient has a normal affect.  STUDIES:   none  ASSESSMENT and PLAN  Acutely ischemic left lower extremity:  The patient has ischemic left leg which is been going on for approximately 2 hours.  He is going emergently to the  operating room for thrombectomy.  I discussed the risks and benefits of the procedure with the patient and his son.  We discussed possibility of delayed fasciotomy if he develops compartment syndrome.  He is at risk of bleeding given that he is on Eliquis.  Acute renal failure: Patient's creatinine is up to 4.  He states that he has been dehydrated secondary to diarrhea.  I will hydrate him and monitor this   Annamarie Major, MD [facility] [phone] Pager [phone]

## 2018-10-14 NOTE — Evaluation (Signed)
Physical Therapy Evaluation Patient Details Name: George Potter MRN: [id number] DOB: 1926-04-03 Today's Date: 10/14/2018   History of Present Illness  Pt is a 84 y.o. male with a hx of HTN, HLD, longstanding persistent atrial fibrillation, and mod-severe MR. He presented to the ED with acute onset of severe left leg pain. Pt was emergently taken to the OR and underwent L iliofemoral thromboembolectomy.     Clinical Impression  Pt admitted with above diagnosis. Pt currently with functional limitations due to the deficits listed below (see PT Problem List). PTA pt lived alone, independent and active. On eval, he required supervision bed mobility, min assist sit to stand, and min guard assist ambulation 20 feet with RW. Gait distance was limited by pt's issue with diarrhea. Pt will benefit from skilled PT to increase their independence and safety with mobility to allow discharge to the venue listed below.  He has all needed home DME. PT to follow acutely.      Follow Up Recommendations No PT follow up(Pt declining home health. May need to reconsider pending progress.)    Equipment Recommendations  None recommended by PT    Recommendations for Other Services       Precautions / Restrictions Precautions Precautions: Fall      Mobility  Bed Mobility Overal bed mobility: Needs Assistance Bed Mobility: Supine to Sit     Supine to sit: Supervision;HOB elevated     General bed mobility comments: supervision for safety, +rail  Transfers Overall transfer level: Needs assistance Equipment used: Rolling walker (2 wheeled) Transfers: Sit to/from Stand Sit to Stand: Min assist         General transfer comment: min assist to power up  Ambulation/Gait Ambulation/Gait assistance: Min guard Gait Distance (Feet): 20 Feet Assistive device: Rolling walker (2 wheeled) Gait Pattern/deviations: Step-through pattern;Decreased stride length     General Gait Details: slow, steady gait. Pt  politely declining hallway ambulation. He is concerned about having an accident due to diarrhea.   Stairs            Wheelchair Mobility    Modified Rankin (Stroke Patients Only)       Balance Overall balance assessment: Mild deficits observed, not formally tested                                           Pertinent Vitals/Pain Pain Assessment: Faces Faces Pain Scale: Hurts a little bit Pain Location: LLE with mobility  Pain Descriptors / Indicators: Sore Pain Intervention(s): Monitored during session;Limited activity within patient's tolerance    Home Living Family/patient expects to be discharged to:: Private residence Living Arrangements: Alone Available Help at Discharge: Family;Friend(s);Available PRN/intermittently Type of Home: House Home Access: Ramped entrance     Home Layout: One level Home Equipment: [place] - 4 wheels;Walker - 2 wheels;Cane - single point;Wheelchair - manual;Shower seat;Hand held shower head Additional Comments: Pt's wife passed away several years ago. He kept all her above noted equipment.    Prior Function Level of Independence: Independent               Hand Dominance        Extremity/Trunk Assessment   Upper Extremity Assessment Upper Extremity Assessment: Overall WFL for tasks assessed    Lower Extremity Assessment Lower Extremity Assessment: LLE deficits/detail LLE Deficits / Details: full AROM, sensation intact LLE: Unable to fully assess due to  pain    Cervical / Trunk Assessment Cervical / Trunk Assessment: Kyphotic  Communication   Communication: No difficulties  Cognition Arousal/Alertness: Awake/alert Behavior During Therapy: WFL for tasks assessed/performed Overall Cognitive Status: Within Functional Limits for tasks assessed                                        General Comments      Exercises     Assessment/Plan    PT Assessment Patient needs continued PT  services  PT Problem List Decreased mobility;Decreased activity tolerance;Pain;Decreased balance;Decreased knowledge of use of DME       PT Treatment Interventions DME instruction;Therapeutic activities;Gait training;Therapeutic exercise;Patient/family education;Balance training;Functional mobility training    PT Goals (Current goals can be found in the Care Plan section)  Acute Rehab PT Goals Patient Stated Goal: home PT Goal Formulation: With patient Time For Goal Achievement: 10/28/18 Potential to Achieve Goals: Good    Frequency Min 3X/week   Barriers to discharge        Co-evaluation               AM-PAC PT "6 Clicks" Daily Activity  Outcome Measure Difficulty turning over in bed (including adjusting bedclothes, sheets and blankets)?: A Little Difficulty moving from lying on back to sitting on the side of the bed? : A Little Difficulty sitting down on and standing up from a chair with arms (e.g., wheelchair, bedside commode, etc,.)?: A Lot Help needed moving to and from a bed to chair (including a wheelchair)?: A Little Help needed walking in hospital room?: A Little Help needed climbing 3-5 steps with a railing? : A Lot 6 Click Score: 16    End of Session Equipment Utilized During Treatment: Gait belt Activity Tolerance: Patient tolerated treatment well Patient left: Other (comment);with call bell/phone within reach(in bathroom) Nurse Communication: Mobility status;Other (comment)(left in bathroom at end of session) PT Visit Diagnosis: Other abnormalities of gait and mobility (R26.89);Difficulty in walking, not elsewhere classified (R26.2);Pain Pain - Right/Left: Left Pain - part of body: Leg    Time: 1440-1454 PT Time Calculation (min) (ACUTE ONLY): 14 min   Charges:   PT Evaluation $PT Eval Low Complexity: 1 Low          Lorrin Goodell, PT  Office # [phone] Pager [phone]   Lorriane Shire 10/14/2018, 3:20 PM

## 2018-10-14 NOTE — Consult Note (Addendum)
Cardiology Consultation:   Patient ID: George Potter MRN: [id number]; DOB: Jan 11, 1926  Admit date: 10/14/2018 Date of Consult: 10/14/2018  Primary Care Provider: Crist Infante, MD Primary Cardiologist: Dr. Minus Breeding  Primary Electrophysiologist:  None    Patient Profile:   George Potter is a 84 y.o. male with a hx of HTN, HLD, longstanding persistent atrial fibrillation, mod-severe MR who is being seen today for the evaluation of atrial fibrillation and acute ischemic limb at the request of Dr. Trula Slade.  History of Present Illness:   George Potter was seen in the office in 05/2017 for evaluation of new onset persistant atrial fibrillation by Dr. Percival Spanish. PCP had started him on Eliquis and labs including TSH were normal. He was asymptomatic and a rate control strategy was pursued.   24 holter monitor showed well controlled ventricular rates with a couple episodes of a wide complex nonsustained rhythm which was felt to be afib with aberrancy.   Echo 06/07/18 showed EF 60-65%, mod-severe MR, mild LAE, mild RV dilation, mod RAE, mod TR and mild pulm HTN with PA pressure 47 mm Hg.  George Potter is a very active 84 year old gentleman.  He is very active in his church and visiting nursing homes 7 days a week.  He walks quite a bit getting to and from different facilities.  He has not noticed any recent decline in exercise tolerance and was in his usual state of health until last week when he developed a sore throat.  He went to his PCPs office and was negative strep strep but apparently throat was very erythematous.  He was prescribed an unknown antibiotic which cause profuse watery diarrhea. He continued on this and his regular medications and tried to eat and drink well.   He was feeling okay and went to bed last night feeling normal. He woke up with acute left thigh and leg pain. He was brought to the emergency department and was taken to the OR emergently by Dr. Trula Slade. An occlusive thrombus  was seen in the common femoral artery at the bifurcation which was completely obstructive.  This was able to be removed with a Fogarty catheter.  Primary closure of a transverse arteriotomy was performed.  Cardiology is consulted given acute thrombotic event on oral anticoagulation. The patient is now feeling totally normal. His leg pain is resolved. No CP or SOB. No LE edema, orthopnea or PND. No dizziness or syncope. No blood in stool or urine. No palpitations.     Past Medical History:  Diagnosis Date  . Atrial fibrillation, chronic    a. on Eliquis  . Diverticulosis   . HTN (hypertension)    Mild  . Severe mitral regurgitation     Past Surgical History:  Procedure Laterality Date  . BACK SURGERY     Partial facetectomy and decompression L4-L5  . CATARACT EXTRACTION Right 2001  . HERNIA REPAIR Right 07/25/2008   Right inguinal herniorrhaphy using a large polypropylene  . INTRAOCULAR LENS INSERTION Right 2001  . NASAL SINUS SURGERY     multiple surgeries     Home Medications:  Prior to Admission medications   Medication Sig Start Date End Date Taking? Authorizing Provider  apixaban (ELIQUIS) 5 MG TABS tablet Take 5 mg by mouth 2 (two) times daily.    [provider]  atorvastatin (LIPITOR) 40 MG tablet Take 40 mg by mouth daily.    [provider]  Cholecalciferol (VITAMIN D3) 1000 units CAPS Take 2,000 Units by mouth  daily.    [provider]  HYDROcodone-acetaminophen (NORCO/VICODIN) 5-325 MG tablet Take 1 tablet by mouth every 6 (six) hours as needed for severe pain. 06/17/17   Fawze, Mina A, PA-C  Omega-3 Fatty Acids (FISH OIL) 1200 MG CAPS Take 1,200 mg by mouth daily.    [provider]  omeprazole (PRILOSEC) 20 MG capsule Take 20 mg by mouth daily.    [provider]  polyvinyl alcohol-povidone (REFRESH) 1.4-0.6 % ophthalmic solution Place 1-2 drops into both eyes as needed (for dry eyes).    [provider]  valsartan  (DIOVAN) 80 MG tablet Take 80 mg by mouth daily.    [provider]    Inpatient Medications: Scheduled Meds: . apixaban  5 mg Oral BID  . atorvastatin  40 mg Oral Daily  . [START ON 10/15/2018] docusate sodium  100 mg Oral Daily  . irbesartan  75 mg Oral Daily  . pantoprazole  40 mg Oral Daily   Continuous Infusions: . sodium chloride 75 mL/hr at 10/14/18 1101  . albumin human    .  ceFAZolin (ANCEF) IV    . magnesium sulfate 1 - 4 g bolus IVPB     PRN Meds: alum & mag hydroxide-simeth, bisacodyl, guaiFENesin-dextromethorphan, hydrALAZINE, HYDROcodone-acetaminophen, labetalol, magnesium sulfate 1 - 4 g bolus IVPB, metoprolol tartrate, morphine injection, ondansetron, oxyCODONE, phenol, polyvinyl alcohol, senna-docusate  Allergies:   No Known Allergies  Social History:   Social History   Socioeconomic History  . Marital status: Widowed    Spouse name: Not on file  . Number of children: 1  . Years of education: Not on file  . Highest education level: Not on file  Occupational History  . Not on file  Social Needs  . Financial resource strain: Not on file  . Food insecurity:    Worry: Not on file    Inability: Not on file  . Transportation needs:    Medical: Not on file    Non-medical: Not on file  Tobacco Use  . Smoking status: Never Smoker  . Smokeless tobacco: Never Used  Substance and Sexual Activity  . Alcohol use: Not on file  . Drug use: Not on file  . Sexual activity: Not on file  Lifestyle  . Physical activity:    Days per week: Not on file    Minutes per session: Not on file  . Stress: Not on file  Relationships  . Social connections:    Talks on phone: Not on file    Gets together: Not on file    Attends religious service: Not on file    Active member of club or organization: Not on file    Attends meetings of clubs or organizations: Not on file    Relationship status: Not on file  . Intimate partner violence:    Fear of current or ex  partner: Not on file    Emotionally abused: Not on file    Physically abused: Not on file    Forced sexual activity: Not on file  Other Topics Concern  . Not on file  Social History Narrative   Worked at [employer].     Family History:   The patient's does not include early heart disease and fibrillation. He's not sure his parents died from.  ROS:  Please see the history of present illness.   All other ROS reviewed and negative.     Physical Exam/Data:   Vitals:   10/14/18 0851 10/14/18 0900  10/14/18 0922 10/14/18 0957  BP: 109/81  112/81 104/84  Pulse: (!) 102 94 (!) 106 97  Resp: 19 17 15 20   Temp:  98.1 F (36.7 C) 97.8 F (36.6 C) 97.8 F (36.6 C)  TempSrc:    Oral  SpO2: 100% 99% 100% 98%  Weight:    72.7 kg  Height:    5\' 11"  (1.803 m)    Intake/Output Summary (Last 24 hours) at 10/14/2018 1154 Last data filed at 10/14/2018 0920 Gross per 24 hour  Intake 3125 ml  Output 550 ml  Net 2575 ml   Filed Weights   10/14/18 0254 10/14/18 0957  Weight: 68.5 kg 72.7 kg   Body mass index is 22.35 kg/m.  General:  Well nourished, well developed, in no acute distress HEENT: normal Lymph: no adenopathy Neck: no JVD Endocrine:  No thryomegaly Vascular: No carotid bruits; FA pulses 2+ bilaterally without bruits  Cardiac:  irreg irreg; no murmur  Lungs:  clear to auscultation bilaterally, no wheezing, rhonchi or rales  Abd: soft, nontender, no hepatomegaly  Ext: no edema Musculoskeletal:  No deformities, BUE and BLE strength normal and equal Skin: warm and dry. Left groin incision site looks good. Neuro:  CNs 2-12 intact, no focal abnormalities noted Psych:  Normal affect   EKG:  The EKG was personally reviewed and demonstrates: atrial fib, PVL, IRBBB HR 109 Telemetry:  Telemetry was personally reviewed and demonstrates:  afib with PVCs and some elevated rates  Relevant CV Studies: Echo 06/07/17 Study Conclusions - Left ventricle: The cavity size was  normal. There was moderate   focal basal hypertrophy of the septum. Systolic function was   normal. The estimated ejection fraction was in the range of 60%   to 65%. Wall motion was normal; there were no regional wall   motion abnormalities. The study is not technically sufficient to   allow evaluation of LV diastolic function. - Aortic valve: Mildly calcified annulus. Trileaflet; mildly   thickened, mildly calcified leaflets. There was mild   regurgitation. - Aorta: Ascending aortic diameter: 38 mm (S). - Ascending aorta: The ascending aorta was mildly dilated. - Mitral valve: Calcified annulus. Mildly thickened leaflets .   There was moderate to severe regurgitation. - Left atrium: The atrium was mildly dilated. - Right ventricle: The cavity size was mildly dilated. Wall   thickness was normal. - Right atrium: The atrium was moderately dilated. - Tricuspid valve: There was moderate regurgitation. - Pulmonary arteries: Systolic pressure was mildly increased. PA   peak pressure: 47 mm Hg (S).  ____________  24 Hour Holter 06/07/17 Study Highlights Atrial fib with controlled ventricular rate.  Rare ectopy.  There are a couple of episodes of wide complex nonsustained rhythm that likely represent atrial fib with aberrant      Laboratory Data:  Chemistry Recent Labs  Lab 10/14/18 0314 10/14/18 0342 10/14/18 0539 10/14/18 0727  NA 131* 131*  --  134*  K 2.5* 2.6*  --  3.0*  CL 96* 98  --  100  CO2  --   --   --  20*  GLUCOSE 127* 132*  --  124*  BUN 63* 63*  --  66*  CREATININE 4.40* 4.30* 4.04* 3.32*  CALCIUM  --   --   --  7.2*  GFRNONAA  --   --  12* 15*  GFRAA  --   --  14* 17*  ANIONGAP  --   --   --  14  No results for input(s): PROT, ALBUMIN, AST, ALT, ALKPHOS, BILITOT in the last 168 hours. Hematology Recent Labs  Lab 10/14/18 0306 10/14/18 0314 10/14/18 0342 10/14/18 0727  WBC 15.7*  --   --  13.8*  RBC 4.95  --   --  4.16*  HGB 13.3 14.6 13.6 11.3*    HCT 40.3 43.0 40.0 34.3*  MCV 81.4  --   --  82.5  MCH 26.9  --   --  27.2  MCHC 33.0  --   --  32.9  RDW 14.6  --   --  14.7  PLT 151  --   --  110*   Cardiac EnzymesNo results for input(s): TROPONINI in the last 168 hours. No results for input(s): TROPIPOC in the last 168 hours.  BNPNo results for input(s): BNP, PROBNP in the last 168 hours.  DDimer No results for input(s): DDIMER in the last 168 hours.  Radiology/Studies:  No results found.  Assessment and Plan:   Acute ischemic limb: s/p successful thrombectomy by Dr. Trula Slade. Patient has been compliant with his Eliquis at home. ? absorption issues given profuse diarrhea.   Acute renal failure: creat up to 4.4. Likely pre renal from severe diarrhea 2/2 abx use. Eliquis dose should probably be adjusted to 2.5mg  while creat remains over 1.4. Will get pharmacy consult   Diarrhea: 2/2 antibiotics Rx'd for sore throat. CDif panel pending  Longstanding persistent atrial fibrillation: he has not been on AV nodal blocking agents as an outpatient as rates were demonstrated to be well controlled on outpatient monitor. HRs are mildly elevated now. Continue to monitor. Possible dose adjustment for Eliquis as above.  Mod-severe MR: I do not hear a murmur on exam. Repeat echo pending. He has not had any associated heart failure from this.   Dispo: doing quite well. At discharge will need close outpatient follow up by cardiology.   For questions or updates, please contact [place] Please consult [email or web address] for contact info under     Signed, Angelena Form, PA-C  10/14/2018 11:54 AM  ---------------------------------------------------------------------------------------------   History and all data above reviewed.  Patient examined.  I agree with the findings as above.  George Potter is a pleasant 84 year old gentleman who presents today with a left ischemic leg in the setting of atrial fibrillation on anticoagulation.  He  received antibiotics for concern for an upper respiratory/pharyngeal infection that was not felt to be strep throat, and afterward had a proximally 1 week of watery diarrhea.  At 2 AM he presented with severe left leg pain and numbness below the knee.  He underwent left leg thromboembolectomy with complete resolution of his pain.  His left leg is warm and there is a palpable pulse and dorsalis pedis.  Constitutional: No acute distress Eyes: pupils equally round and reactive to light, sclera non-icteric, normal conjunctiva and lids ENMT: moist mucous membranes Cardiovascular: Irregularly irregular rhythm, tachycardic to 105 bpm, variable holosystolic murmur, approximately 1/6, heard best over the left precordium.  S1 and S2 normal. No jugular venous distention.  Respiratory: clear to auscultation bilaterally GI : normal bowel sounds, soft and nontender. No distention.   MSK: extremities warm, well perfused. No edema.  NEURO: grossly nonfocal exam, moves all extremities. PSYCH: alert and oriented x 3, normal mood and affect.   All available labs, radiology testing, previous records reviewed. Agree with documented assessment and plan of my colleague as stated above with the following additions or changes:  Principal Problem:  Atrial fibrillation, chronic Active Problems:   PAD (peripheral artery disease) (HCC)   HTN (hypertension)   Mitral regurgitation   Plan: He is currently comfortable and asymptomatic with regard to his rates in atrial fibrillation.  No current indication for addition of rate control, however if he becomes symptomatic or for his rates increased greater than 110 bpm, consider low-dose oral metoprolol for initiation and consider up titration as needed.  He had adequate rate control as an outpatient on a monitor performed last year, and he does not recall anything changing in the interim.  His Eliquis dose for his age and renal function may need to be adjusted, we have  requested the assistance of our colleagues and pharmacy.  I anticipate a short-term dose reduction of 2.5 mg twice daily until renal function improves.  He has a history of mitral regurgitation which has been called moderate to severe in the past.  His exam is inconsistent with severe mitral regurgitation today, however we will have an echocardiogram performed.  It may be difficult to quantitate the mitral regurgitation in the setting of atrial fibrillation at a rate of 100, however we will get a qualitative assessment as well as overall cardiovascular function in the setting of recent left leg ischemia and chronic atrial fibrillation.  Cardiology will follow along for follow-up of the echocardiogram findings.  Elouise Munroe, MD [facility] 12:24 PM  10/14/2018

## 2018-10-14 NOTE — Progress Notes (Addendum)
Pt arrived to 4E23 from PACU. Pt A&Ox4;VSS. Tele applied;CCMD notified. CHG complete. Left fem Level 0 ;clean&dry. Pt with no complaints. Call bell within reach. Bed alarm activated.  Will continue to monitor.  Lilla Shook BSN

## 2018-10-14 NOTE — Progress Notes (Signed)
[facility] for apixaban>>heparin Indication: atrial fibrillation  No Known Allergies  Patient Measurements: Height: 5\' 11"  (180.3 cm) Weight: 160 lb 4.4 oz (72.7 kg) IBW/kg (Calculated) : 75.3  Heparin dose weight: 72.7 kg   Vital Signs: Temp: 97.8 F (36.6 C) (10/20 0957) Temp Source: Oral (10/20 0957) BP: 104/84 (10/20 0957) Pulse Rate: 97 (10/20 0957)  Labs: Recent Labs    10/14/18 0306  10/14/18 0314 10/14/18 0342 10/14/18 0539 10/14/18 0727  HGB 13.3  --  14.6 13.6  --  11.3*  HCT 40.3  --  43.0 40.0  --  34.3*  PLT 151  --   --   --   --  110*  CREATININE  --    < > 4.40* 4.30* 4.04* 3.32*   < > = values in this interval not displayed.    Estimated Creatinine Clearance: 15.2 mL/min (A) (by C-G formula based on SCr of 3.32 mg/dL (H)).   Medical History: Past Medical History:  Diagnosis Date  . Atrial fibrillation, chronic    a. on Eliquis  . Diverticulosis   . HTN (hypertension)    Mild  . Severe mitral regurgitation     Medications:  Scheduled:  . apixaban  5 mg Oral BID  . atorvastatin  40 mg Oral Daily  . [START ON 10/15/2018] docusate sodium  100 mg Oral Daily  . irbesartan  75 mg Oral Daily  . pantoprazole  40 mg Oral Daily    Assessment: 38 yom with hx of atrial fibrillation - on apixaban PTA (LD 10/19 PM). Underwent successful thrombectomy on 10/20. Has been complaining of diarrhea - likely slightly dehydrated.  Currently reports taking full dose 5 mg PTA - given Scr elevated (now trending down to 3.32 today) and age greater than 90, would reduce dose to 2.5 mg until Scr stabilizes. Discussed with both cardiology and vascular, plan to start heparin infusion while Scr elevated given recent thrombectomy.   Hgb 11.3, plt 110. No s/sx of bleeding.   Goal of Therapy:  Monitor platelets by anticoagulation protocol: Yes   Plan:  Start heparin infusion at 1000 units/hr Monitor heparin level in 8 hours   Monitor daily HL/aPTT, Scr, CBC, and for s/sx of bleeding  F/u plan to transition back to apixaban in future  Doylene Canard, PharmD Clinical Pharmacist  Pager: [phone] Phone: [phone] 10/14/2018,11:53 AM

## 2018-10-14 NOTE — Transfer of Care (Signed)
Immediate Anesthesia Transfer of Care Note  Patient: George Potter  Procedure(s) Performed: Left Femoral Embolectomy (Left )  Patient Location: PACU  Anesthesia Type:General  Level of Consciousness: awake, alert , oriented and patient cooperative  Airway & Oxygen Therapy: Patient Spontanous Breathing and Patient connected to nasal cannula oxygen  Post-op Assessment: Report given to RN, Post -op Vital signs reviewed and stable and Patient moving all extremities X 4  Post vital signs: Reviewed and stable  Last Vitals:  Vitals Value Taken Time  BP 87/61 10/14/2018  5:50 AM  Temp    Pulse 98 10/14/2018  5:51 AM  Resp 27 10/14/2018  5:52 AM  SpO2 99 % 10/14/2018  5:51 AM  Vitals shown include unvalidated device data.  Last Pain:  Vitals:   10/14/18 0256  TempSrc: Oral  PainSc:          Complications: No apparent anesthesia complications

## 2018-10-14 NOTE — ED Triage Notes (Signed)
Pt BIB [facility] for eval of L leg pain onset 4 hours PTA. Pt reports L sided hip/thigh pain, denies hx of same. Pt was recently seen at PCP for eval of a "throat infection" and has been on cefdinir. EMS reports decreased sensation and cap refill to L foot. Foot appears markedly paler than R.

## 2018-10-14 NOTE — Progress Notes (Signed)
[facility] for apixaban>>heparin Indication: atrial fibrillation  No Known Allergies  Patient Measurements: Height: 5\' 11"  (180.3 cm) Weight: 160 lb 4.4 oz (72.7 kg) IBW/kg (Calculated) : 75.3  Heparin dose weight: 72.7 kg   Vital Signs: Temp: 99.1 F (37.3 C) (10/20 1958) Temp Source: Oral (10/20 1958) BP: 106/67 (10/20 1958) Pulse Rate: 101 (10/20 1958)  Labs: Recent Labs    10/14/18 0306  10/14/18 0342 10/14/18 0539 10/14/18 0727 10/14/18 2135  HGB 13.3   < > 13.6  --  11.3* 10.9*  HCT 40.3   < > 40.0  --  34.3* 32.3*  PLT 151  --   --   --  110* 104*  APTT  --   --   --   --   --  99*  HEPARINUNFRC  --   --   --   --   --  0.20*  CREATININE  --    < > 4.30* 4.04* 3.32* 2.40*   < > = values in this interval not displayed.    Estimated Creatinine Clearance: 21 mL/min (A) (by C-G formula based on SCr of 2.4 mg/dL (H)).   Medical History: Past Medical History:  Diagnosis Date  . Atrial fibrillation, chronic    a. on Eliquis  . Diverticulosis   . HTN (hypertension)    Mild  . Severe mitral regurgitation     Medications:  Scheduled:  . atorvastatin  40 mg Oral Daily  . [START ON 10/15/2018] docusate sodium  100 mg Oral Daily  . irbesartan  75 mg Oral Daily  . pantoprazole  40 mg Oral Daily    Assessment: 9 yom with hx of atrial fibrillation - on apixaban PTA (LD 10/19 PM). Underwent successful thrombectomy on 10/20. Has been complaining of diarrhea - likely slightly dehydrated.  Currently reports taking full dose 5 mg PTA - given Scr elevated (now trending down to 3.32 today) and age greater than 90, would reduce dose to 2.5 mg until Scr stabilizes. Discussed with both cardiology and vascular, plan to start heparin infusion while Scr elevated given recent thrombectomy.   Hgb 11.3, plt 110. No s/sx of bleeding.  Initial heparin level 0.2 units/ml.  APTT 99 sec  Eliquis does not seem to affect HL at this point so will  use HL to guide therapy  Goal of Therapy:  Monitor platelets by anticoagulation protocol: Yes   Plan:  Increase heparin infusion to 1100 units/hr Monitor heparin level in 8 hours  Monitor daily HL, Scr, CBC, and for s/sx of bleeding  F/u plan to transition back to apixaban in future  Thanks for allowing pharmacy to be a part of this patient's care.  Excell Seltzer, PharmD Clinical Pharmacist ,11:17 PM

## 2018-10-14 NOTE — ED Provider Notes (Signed)
[facility] EMERGENCY DEPARTMENT Provider Note   CSN: [id number] Arrival date & time: 10/14/18  0249     History   Chief Complaint Chief Complaint  Patient presents with  . Leg Pain    HPI George Potter is a 84 y.o. male.  HPI 84 year old male with history of A. fib on Eliquis comes in with chief complaint of severe leg pain. She reports that he woke up around 2 AM with severe pain and called his son within a few minutes.  Patient complains of severe left lower extremity pain and difficulty in moving it.  He had a fall onto his knee because of pain and difficulty to move the lower extremity.  Patient describes the pain as sharp pain that starts at the level of thigh and moves towards the foot.  Patient has numbness in his leg beyond the knee.  No history of similar symptoms in the past.  Patient took his Eliquis yesterday morning.   Past Medical History:  Diagnosis Date  . Atrial fibrillation, chronic    a. on Eliquis  . Diverticulosis   . HTN (hypertension)    Mild  . Severe mitral regurgitation     Patient Active Problem List   Diagnosis Date Noted  . PAD (peripheral artery disease) ([place]) 10/14/2018  . Atrial fibrillation, chronic 10/14/2018  . HTN (hypertension) 10/14/2018  . Mitral regurgitation 10/14/2018    Past Surgical History:  Procedure Laterality Date  . BACK SURGERY     Partial facetectomy and decompression L4-L5  . CATARACT EXTRACTION Right 2001  . HERNIA REPAIR Right 07/25/2008   Right inguinal herniorrhaphy using a large polypropylene  . INTRAOCULAR LENS INSERTION Right 2001  . NASAL SINUS SURGERY     multiple surgeries        Home Medications    Prior to Admission medications   Medication Sig Start Date End Date Taking? Authorizing Provider  apixaban (ELIQUIS) 5 MG TABS tablet Take 5 mg by mouth 2 (two) times daily.    [provider]  atorvastatin (LIPITOR) 40 MG tablet Take 40 mg by mouth daily.    [provider]  Cholecalciferol (VITAMIN D3) 1000 units CAPS Take 2,000 Units by mouth daily.    [provider]  HYDROcodone-acetaminophen (NORCO/VICODIN) 5-325 MG tablet Take 1 tablet by mouth every 6 (six) hours as needed for severe pain. 06/17/17   Fawze, Mina A, PA-C  Omega-3 Fatty Acids (FISH OIL) 1200 MG CAPS Take 1,200 mg by mouth daily.    [provider]  omeprazole (PRILOSEC) 20 MG capsule Take 20 mg by mouth daily.    [provider]  polyvinyl alcohol-povidone (REFRESH) 1.4-0.6 % ophthalmic solution Place 1-2 drops into both eyes as needed (for dry eyes).    [provider]  valsartan (DIOVAN) 80 MG tablet Take 80 mg by mouth daily.    [provider]    Family History History reviewed. No pertinent family history.  Social History Social History   Tobacco Use  . Smoking status: Never Smoker  . Smokeless tobacco: Never Used  Substance Use Topics  . Alcohol use: Not on file  . Drug use: Not on file     Allergies   Patient has no known allergies.   Review of Systems Review of Systems  Constitutional: Positive for activity change.  Respiratory: Negative for shortness of breath.   Cardiovascular: Negative for chest pain.  Gastrointestinal: Negative for abdominal pain.  Musculoskeletal: Positive for myalgias.  Hematological: Bruises/bleeds easily.     Physical Exam Updated Vital Signs BP 106/61 (BP Location: Right Arm)   Pulse 84   Temp 97.8 F (36.6 C) (Oral)   Resp 16   Ht 5\' 11"  (1.803 m)   Wt 72.7 kg   SpO2 95%   BMI 22.35 kg/m   Physical Exam  Constitutional: He is oriented to person, place, and time. He appears well-developed.  HENT:  Head: Atraumatic.  Neck: Neck supple.  Cardiovascular:  Irregular rhythm, tachycardia Patient has bilateral 2+ femoral pulse. 2+ popliteal pulse in the right lower extremity, there is no palpable popliteal pulse in the left lower extremity.  We are able to appreciate  pulse with Doppler is faintly in the right dorsalis pedis, but the same cannot be appreciated on the left foot.  Pulmonary/Chest: Effort normal.  Neurological: He is alert and oriented to person, place, and time.  Patient unable to wiggle his toes in the left lower extremity  Skin:  Left leg is pale appearing and cool to touch  Nursing note and vitals reviewed.    ED Treatments / Results  Labs (all labs ordered are listed, but only abnormal results are displayed) Labs Reviewed  CBC WITH DIFFERENTIAL/PLATELET - Abnormal; Notable for the following components:      Result Value   WBC 15.7 (*)    Neutro Abs 12.4 (*)    Abs Immature Granulocytes 0.76 (*)    All other components within normal limits  CREATININE, SERUM - Abnormal; Notable for the following components:   Creatinine, Ser 4.04 (*)    GFR calc non Af Amer 12 (*)    GFR calc Af Amer 14 (*)    All other components within normal limits  CBC - Abnormal; Notable for the following components:   WBC 13.8 (*)    RBC 4.16 (*)    Hemoglobin 11.3 (*)    HCT 34.3 (*)    Platelets 110 (*)    All other components within normal limits  BASIC METABOLIC PANEL - Abnormal; Notable for the following components:   Sodium 134 (*)    Potassium 3.0 (*)    CO2 20 (*)    Glucose, Bld 124 (*)    BUN 66 (*)    Creatinine, Ser 3.32 (*)    Calcium 7.2 (*)    GFR calc non Af Amer 15 (*)    GFR calc Af Amer 17 (*)    All other components within normal limits  CBC - Abnormal; Notable for the following components:   RBC 4.00 (*)    Hemoglobin 10.9 (*)    HCT 32.3 (*)    Platelets 104 (*)    All other components within normal limits  BASIC METABOLIC PANEL - Abnormal; Notable for the following components:   Sodium 134 (*)    Potassium 2.8 (*)    CO2 18 (*)    Glucose, Bld 102 (*)    BUN 51 (*)    Creatinine, Ser 2.40 (*)    Calcium 7.0 (*)    GFR calc non Af Amer 22 (*)    GFR calc Af Amer 26 (*)    Anion gap 17 (*)    All other  components within normal limits  HEPARIN LEVEL (UNFRACTIONATED) - Abnormal; Notable for the following components:   Heparin Unfractionated 0.20 (*)    All other components within normal limits  APTT - Abnormal; Notable for the following components:   aPTT 99 (*)  All other components within normal limits  I-STAT CHEM 8, ED - Abnormal; Notable for the following components:   Sodium 131 (*)    Potassium 2.5 (*)    Chloride 96 (*)    BUN 63 (*)    Creatinine, Ser 4.40 (*)    Glucose, Bld 127 (*)    Calcium, Ion 0.94 (*)    TCO2 19 (*)    All other components within normal limits  I-STAT CHEM 8, ED - Abnormal; Notable for the following components:   Sodium 131 (*)    Potassium 2.6 (*)    BUN 63 (*)    Creatinine, Ser 4.30 (*)    Glucose, Bld 132 (*)    Calcium, Ion 0.90 (*)    TCO2 18 (*)    All other components within normal limits  C DIFFICILE QUICK SCREEN W PCR REFLEX  CBC  HEPARIN LEVEL (UNFRACTIONATED)  TYPE AND SCREEN  ABO/RH  SURGICAL PATHOLOGY    EKG EKG Interpretation  Date/Time:  Sunday October 14 2018 02:54:51 EDT Ventricular Rate:  111 PR Interval:    QRS Duration: 113 QT Interval:  330 QTC Calculation: 449 R Axis:   -3 Text Interpretation:  Atrial fibrillation Incomplete right bundle branch block No acute changes No significant change since last tracing Confirmed by Varney Biles ([id number]) on 10/14/2018 3:41:25 AM   Radiology No results found.  Procedures .Critical Care Performed by: Varney Biles, MD Authorized by: Varney Biles, MD   Critical care provider statement:    Critical care time (minutes):  45   Critical care start time:  10/15/2018 3:00 AM   Critical care end time:  10/15/2018 4:00 AM   Critical care was necessary to treat or prevent imminent or life-threatening deterioration of the following conditions:  Circulatory failure   Critical care was time spent personally by me on the following activities:  Discussions with  consultants, evaluation of patient's response to treatment, examination of patient, ordering and performing treatments and interventions, ordering and review of laboratory studies, ordering and review of radiographic studies, re-evaluation of patient's condition, obtaining history from patient or surrogate, review of old charts and blood draw for specimens   I assumed direction of critical care for this patient from another provider in my specialty: yes     (including critical care time)   Medications Ordered in ED Medications  pantoprazole (PROTONIX) EC tablet 40 mg (40 mg Oral Given 10/14/18 1056)  polyvinyl alcohol (LIQUIFILM TEARS) 1.4 % ophthalmic solution 1-2 drop (has no administration in time range)  atorvastatin (LIPITOR) tablet 40 mg (40 mg Oral Given 10/14/18 1056)  HYDROcodone-acetaminophen (NORCO/VICODIN) 5-325 MG per tablet 1 tablet (1 tablet Oral Given 10/14/18 2154)  irbesartan (AVAPRO) tablet 75 mg (75 mg Oral Given 10/14/18 1056)  magnesium sulfate IVPB 2 g 50 mL (has no administration in time range)  ceFAZolin (ANCEF) IVPB 2g/100 mL premix (2 g Intravenous New Bag/Given 10/14/18 1340)  docusate sodium (COLACE) capsule 100 mg (has no administration in time range)  ondansetron (ZOFRAN) injection 4 mg (has no administration in time range)  alum & mag hydroxide-simeth (MAALOX/MYLANTA) 200-200-20 MG/5ML suspension 15-30 mL (has no administration in time range)  labetalol (NORMODYNE,TRANDATE) injection 10 mg (has no administration in time range)  hydrALAZINE (APRESOLINE) injection 5 mg (has no administration in time range)  metoprolol tartrate (LOPRESSOR) injection 2-5 mg (has no administration in time range)  guaiFENesin-dextromethorphan (ROBITUSSIN DM) 100-10 MG/5ML syrup 15 mL (has no administration in time range)  phenol (CHLORASEPTIC)  mouth spray 1 spray (has no administration in time range)  0.9 %  sodium chloride infusion ( Intravenous New Bag/Given 10/15/18 0028)    oxyCODONE (Oxy IR/ROXICODONE) immediate release tablet 5-10 mg (has no administration in time range)  morphine 2 MG/ML injection 0.5-1 mg (has no administration in time range)  senna-docusate (Senokot-S) tablet 1 tablet (has no administration in time range)  bisacodyl (DULCOLAX) EC tablet 5 mg (has no administration in time range)  albumin human 5 % solution (has no administration in time range)  heparin ADULT infusion 100 units/mL (25000 units/257mL sodium chloride 0.45%) (1,100 Units/hr Intravenous Rate/Dose Change 10/15/18 0020)  fentaNYL (SUBLIMAZE) injection 50 mcg (50 mcg Intravenous Given 10/14/18 0314)  lactated ringers bolus 1,000 mL (1,000 mLs Intravenous New Bag/Given 10/14/18 0329)  0.9 %  sodium chloride infusion (0 mLs Intravenous Stopped 10/14/18 0637)  potassium chloride 10 mEq in 100 mL IVPB (10 mEq Intravenous New Bag/Given 10/14/18 0713)  albumin human 5 % solution 12.5 g (12.5 g Intravenous New Bag/Given 10/14/18 7048)     Initial Impression / Assessment and Plan / ED Course  I have reviewed the triage vital signs and the nursing notes.  Pertinent labs & imaging results that were available during my care of the patient were reviewed by me and considered in my medical decision making (see chart for details).  Clinical Course as of Oct 15 238  Sun Oct 14, 2018  0310 Received a call back from Dr. Tommie Raymond, vascular surgery. He is aware that patient has a pulseless left lower extremity that is cool to touch.  We have not established a pulse in his right lower extremity either, therefore he is requested a CT angios with runoff in the lower extremity, pending creatinine.  Chem-8 is already been sent by the nurse.   [AN]  [phone] Patient is noted to have significant elevation of his creatinine.  Dr. Trula Slade made aware of this finding and the CT will not be continued.  In the interim a we were able to get a dopplerable pulse in the right foot.  I can certainly palpate the  popliteal pulse in the right lower extremity but we are not able to palpate popliteal pulse in the left lower extremity.  Creatinine(!): 4.40 [AN]    Clinical Course User Index [AN] Varney Biles, MD    Patient comes in with chief complaint of severe pain in his left lower extremity. He has history of A. fib on Eliquis.  Otherwise he is relatively healthy.  On exam patient has a cool, pale left lower extremity, particularly distal to the knee.  We did not have a dopplerable or palpable popliteal or dorsalis pedis pulse.  Patient also has motor deficits in the left foot and no sensation in the left lower extremity distal to  the knee.  Vascular surgery was consulted immediately as we have clinical concerns for occlusive vascular disease in the left lower extremity leading to ischemic pain and neuropathy.    Final Clinical Impressions(s) / ED Diagnoses   Final diagnoses:  Ischemic leg    ED Discharge Orders    None       Varney Biles, MD 10/15/18 [phone]

## 2018-10-14 NOTE — Progress Notes (Signed)
-  Status post left leg thromboembolectomy. -Patient's pain has completely resolved following embolectomy.  He has a palpable dorsalis pedis pulse.  Compartments are soft. -He is tachycardic into the low 100s.  I have called cardiology.  I am continuing his Eliquis. -Acute renal failure: I suspect this is secondary to dehydration from diarrhea.  We will continue with IV fluid resuscitation. -With history of diarrhea I will check for C. difficile colitis.  George Potter

## 2018-10-15 ENCOUNTER — Encounter (HOSPITAL_COMMUNITY): Payer: Self-pay | Admitting: Surgery

## 2018-10-15 ENCOUNTER — Inpatient Hospital Stay (HOSPITAL_COMMUNITY): Payer: Medicare Other

## 2018-10-15 DIAGNOSIS — I361 Nonrheumatic tricuspid (valve) insufficiency: Secondary | ICD-10-CM

## 2018-10-15 LAB — CBC
HCT: 32 % — ABNORMAL LOW (ref 39.0–52.0)
Hemoglobin: 10.7 g/dL — ABNORMAL LOW (ref 13.0–17.0)
MCH: 27.5 pg (ref 26.0–34.0)
MCHC: 33.4 g/dL (ref 30.0–36.0)
MCV: 82.3 fL (ref 80.0–100.0)
NRBC: 0 % (ref 0.0–0.2)
PLATELETS: 96 10*3/uL — AB (ref 150–400)
RBC: 3.89 MIL/uL — ABNORMAL LOW (ref 4.22–5.81)
RDW: 15.1 % (ref 11.5–15.5)
WBC: 9.9 10*3/uL (ref 4.0–10.5)

## 2018-10-15 LAB — HEPARIN LEVEL (UNFRACTIONATED)
HEPARIN UNFRACTIONATED: 0.3 [IU]/mL (ref 0.30–0.70)
Heparin Unfractionated: 0.22 IU/mL — ABNORMAL LOW (ref 0.30–0.70)

## 2018-10-15 LAB — C DIFFICILE QUICK SCREEN W PCR REFLEX
C DIFFICILE (CDIFF) TOXIN: NEGATIVE
C DIFFICLE (CDIFF) ANTIGEN: NEGATIVE
C Diff interpretation: NOT DETECTED

## 2018-10-15 LAB — ECHOCARDIOGRAM COMPLETE
HEIGHTINCHES: 71 in
WEIGHTICAEL: 2564.39 [oz_av]

## 2018-10-15 MED ORDER — TAMSULOSIN HCL 0.4 MG PO CAPS
0.4000 mg | ORAL_CAPSULE | Freq: Every day | ORAL | Status: DC
  Administered 2018-10-15 – 2018-10-17 (×3): 0.4 mg via ORAL
  Filled 2018-10-15 (×3): qty 1

## 2018-10-15 MED ORDER — POTASSIUM CHLORIDE 10 MEQ/100ML IV SOLN
10.0000 meq | INTRAVENOUS | Status: AC
  Administered 2018-10-15 (×2): 10 meq via INTRAVENOUS
  Filled 2018-10-15 (×2): qty 100

## 2018-10-15 MED ORDER — POTASSIUM CHLORIDE CRYS ER 20 MEQ PO TBCR
40.0000 meq | EXTENDED_RELEASE_TABLET | Freq: Three times a day (TID) | ORAL | Status: AC
  Administered 2018-10-15 (×3): 40 meq via ORAL
  Filled 2018-10-15 (×3): qty 2

## 2018-10-15 NOTE — Progress Notes (Signed)
  Echocardiogram 2D Echocardiogram has been performed.  George Potter 10/15/2018, 11:33 AM

## 2018-10-15 NOTE — Progress Notes (Signed)
Progress Note  Patient Name: George Potter Date of Encounter: 10/15/2018  Primary Cardiologist: Minus Breeding, MD   Subjective   Feels comfortable no chest pain fevers chills nausea vomiting syncope.  Had very small amount of loose stool today.  Did not have bowel movement yesterday.  Inpatient Medications    Scheduled Meds: . atorvastatin  40 mg Oral Daily  . docusate sodium  100 mg Oral Daily  . irbesartan  75 mg Oral Daily  . pantoprazole  40 mg Oral Daily  . tamsulosin  0.4 mg Oral Daily   Continuous Infusions: . sodium chloride 75 mL/hr at 10/15/18 0028  . heparin 1,100 Units/hr (10/15/18 0020)  . magnesium sulfate 1 - 4 g bolus IVPB     PRN Meds: alum & mag hydroxide-simeth, bisacodyl, guaiFENesin-dextromethorphan, hydrALAZINE, HYDROcodone-acetaminophen, labetalol, magnesium sulfate 1 - 4 g bolus IVPB, metoprolol tartrate, morphine injection, ondansetron, oxyCODONE, phenol, polyvinyl alcohol, senna-docusate   Vital Signs    Vitals:   10/15/18 0342 10/15/18 0400 10/15/18 0749 10/15/18 0800  BP: 119/65 104/62  132/70  Pulse: 99 90  97  Resp: 20 (!) 25  (!) 22  Temp: (!) 87.5 F (30.8 C)  97.8 F (36.6 C)   TempSrc: Oral  Oral   SpO2: 100% 98%  98%  Weight:      Height:        Intake/Output Summary (Last 24 hours) at 10/15/2018 1039 Last data filed at 10/15/2018 0400 Gross per 24 hour  Intake 2039.92 ml  Output 1330 ml  Net 709.92 ml   Filed Weights   10/14/18 0254 10/14/18 0957  Weight: 68.5 kg 72.7 kg    Telemetry    Atrial fibrillation well rate controlled currently 90- Personally Reviewed  ECG    Atrial fibrillation- Personally Reviewed  Physical Exam   GEN: No acute distress.  Laying comfortably in bed getting echocardiogram performed.  Looks younger than stated age Neck: No JVD Cardiac: Irreg, normal rate, mild systolic murmur,no rubs, or gallops.  Respiratory: Clear to auscultation bilaterally. GI: Soft, nontender, non-distended    MS: No edema; No deformity. Neuro:  Nonfocal  Psych: Normal affect   Labs    Chemistry Recent Labs  Lab 10/14/18 0342 10/14/18 0539 10/14/18 0727 10/14/18 2135  NA 131*  --  134* 134*  K 2.6*  --  3.0* 2.8*  CL 98  --  100 99  CO2  --   --  20* 18*  GLUCOSE 132*  --  124* 102*  BUN 63*  --  66* 51*  CREATININE 4.30* 4.04* 3.32* 2.40*  CALCIUM  --   --  7.2* 7.0*  GFRNONAA  --  12* 15* 22*  GFRAA  --  14* 17* 26*  ANIONGAP  --   --  14 17*     Hematology Recent Labs  Lab 10/14/18 0727 10/14/18 2135 10/15/18 0756  WBC 13.8* 9.6 9.9  RBC 4.16* 4.00* 3.89*  HGB 11.3* 10.9* 10.7*  HCT 34.3* 32.3* 32.0*  MCV 82.5 80.8 82.3  MCH 27.2 27.3 27.5  MCHC 32.9 33.7 33.4  RDW 14.7 14.9 15.1  PLT 110* 104* 96*    Cardiac EnzymesNo results for input(s): TROPONINI in the last 168 hours. No results for input(s): TROPIPOC in the last 168 hours.   BNPNo results for input(s): BNP, PROBNP in the last 168 hours.   DDimer No results for input(s): DDIMER in the last 168 hours.   Radiology    No results found.  Cardiac Studies   Echocardiogram previously showed moderate to severe mitral regurgitation.  Current echocardiogram is being performed.  At first glance, EF appears normal.  Patient Profile     84 y.o. male with thrombectomy secondary to embolic phenomenon while taking Eliquis with long-standing persistent atrial fibrillation, moderate to severe mitral regurgitation, hypertension, hyperlipidemia  Assessment & Plan    Permanent atrial fibrillation - While acute kidney injury is resolving, he is being maintained currently on IV heparin.  On arrival, creatinine 4.4, currently 2.4 improving. -With Eliquis, if his creatinine remains greater than 1.5, and age of 84 years old, he should be dose adjusted to 2.5 mg twice a day.  If his creatinine returns to normal/less than 1.5, continue with 5 mg twice a day.  Pharmacy team is following along. - It is certainly possible that  his episodes of acute diarrhea led to decreased absorption of his medication which may have precipitated embolic phenomenon. -Currently maintaining weight control.  Moderate to severe mitral regurgitation - Continue to monitor clinically.  No significant shortness of breath.  Thrombectomy to lower extremity - Vascular note reviewed, Dr. Trula Slade  Acute kidney injury - Presumed azotemia in the setting of diarrhea after taking antibiotics.  Continuing with IV hydration.  No current signs of fluid overload.  Hypokalemia - 2.8.  We will give him some oral potassium as well, 3 doses.  Currently ordered for IV.  If he remains hypokalemic tomorrow, will we need to reorder.  Likely from diarrhea recently.  I do not want him overdo potassium in the setting of his acute kidney injury however.      For questions or updates, please contact [place] Please consult [email or web address] for contact info under        Signed, Candee Furbish, MD  10/15/2018, 10:39 AM

## 2018-10-15 NOTE — Progress Notes (Signed)
ANTICOAGULATION CONSULT NOTE - Follow Up Consult  Pharmacy Consult for Heparin Indication: afib and  S/p s/p L iliofemoral thromboembolectomy No Known Allergies  Patient Measurements: Height: 5\' 11"  (180.3 cm) Weight: 160 lb 4.4 oz (72.7 kg) IBW/kg (Calculated) : 75.3 Heparin Dosing Weight:   72.7 kg  Vital Signs: Temp: 97.8 F (36.6 C) (10/21 0749) Temp Source: Oral (10/21 0749) BP: 104/62 (10/21 0400) Pulse Rate: 90 (10/21 0400)  Labs: Recent Labs    10/14/18 0539 10/14/18 0727 10/14/18 2135 10/15/18 0756  HGB  --  11.3* 10.9* 10.7*  HCT  --  34.3* 32.3* 32.0*  PLT  --  110* 104* 96*  APTT  --   --  99*  --   HEPARINUNFRC  --   --  0.20* 0.30  CREATININE 4.04* 3.32* 2.40*  --     Estimated Creatinine Clearance: 21 mL/min (A) (by C-G formula based on SCr of 2.4 mg/dL (H)).  Assessment: Anticoag: Apixaban PTA for Afib (LD 10/19 PM per pt) - plan for apixaban but vascular request IV hep given recent procedure + elevated Scr. s/p L iliofemoral thromboembolectomy 10/20. Heparin level 0.2>0.3. Hgb down from baseline (13.6) at 10.7. Plts 96 remain low.   Goal of Therapy:  Heparin level 0.3-0.7 units/ml Monitor platelets by anticoagulation protocol: Yes   Plan:  Continue IV heparin at 1100 units/hr. Confirm level in 6 hrs. Monitor daily HL/aPTT, Scr, CBC, and for s/sx of bleeding  F/u plan to transition back to apixaban in future (dose per Afib per convo w/ cards + vascular)  Jaquise Faux S. Alford Highland, PharmD, [place] Clinical Staff Pharmacist [phone] [place], [place] 10/15/2018,9:41 AM

## 2018-10-15 NOTE — Evaluation (Signed)
Occupational Therapy Evaluation Patient Details Name: George Potter MRN: [id number] DOB: Nov 01, 1926 Today's Date: 10/15/2018    History of Present Illness Pt is a 84 y.o. male with a hx of HTN, HLD, longstanding persistent atrial fibrillation, and mod-severe MR. He presented to the ED with acute onset of severe left leg pain. Pt was emergently taken to the OR and underwent L iliofemoral thromboembolectomy.    Clinical Impression   Pt admitted with the above diagnosis and has the deficits listed below. Pt would benefit from cont OT to increase safety and independence with basic adls and adl transfers so he can dc home alone with son helping as needed. Pt has all necessary equipment and is aware of safety issues.  Feel he may benefit from a few days of 24/7 supervision from son if available since he is declining [place]. Ideally HHOT would be beneficial, but if he declines, his son being available for just a few days would be helpful.    Follow Up Recommendations  Home health OT;Supervision/Assistance - 24 hour    Equipment Recommendations  None recommended by OT    Recommendations for Other Services       Precautions / Restrictions Precautions Precautions: Fall Restrictions Weight Bearing Restrictions: No      Mobility Bed Mobility Overal bed mobility: Needs Assistance Bed Mobility: Supine to Sit     Supine to sit: Supervision;HOB elevated     General bed mobility comments: supervision for safety, +rail  Transfers Overall transfer level: Needs assistance Equipment used: Rolling walker (2 wheeled) Transfers: Sit to/from [facility] Sit to Stand: Min assist Stand pivot transfers: Min guard       General transfer comment: min assist to power up    Balance Overall balance assessment: Mild deficits observed, not formally tested                                         ADL either performed or assessed with clinical judgement   ADL  Overall ADL's : Needs assistance/impaired Eating/Feeding: Independent;Sitting   Grooming: Wash/dry hands;Wash/dry face;Oral care;Supervision/safety;Standing Grooming Details (indicate cue type and reason): Pt walked to sink and groomed with supervision for appx. 5 minutes Upper Body Bathing: Set up;Sitting   Lower Body Bathing: Minimal assistance;Sit to/from stand Lower Body Bathing Details (indicate cue type and reason): min assist to reach L foot only Upper Body Dressing : Set up;Sitting   Lower Body Dressing: Minimal assistance;Sit to/from stand;Cueing for compensatory techniques Lower Body Dressing Details (indicate cue type and reason): Pt required min assist to donn L sock/shoe only. Toilet Transfer: Minimal assistance;Comfort height toilet;Grab bars;RW Armed forces technical officer Details (indicate cue type and reason): Pt walked to bathroom requiring min assist to get off of toilet.  Pt states his toilet is higher at home.   Toileting- Clothing Manipulation and Hygiene: Supervision/safety;Sitting/lateral lean       Functional mobility during ADLs: Min guard;Rolling walker General ADL Comments: Pt does well overall with adls requiring min assist with reaching L Lower leg due to pain.  Pt eager to be independent and get back home.      Vision Baseline Vision/History: Cataracts;Wears glasses Wears Glasses: At all times Patient Visual Report: No change from baseline Vision Assessment?: No apparent visual deficits     Perception Perception Perception Tested?: No   Praxis Praxis Praxis tested?: Not tested    Pertinent Vitals/Pain Pain Assessment:  Faces Faces Pain Scale: Hurts a little bit Pain Location: LLE with mobility  Pain Descriptors / Indicators: Sore Pain Intervention(s): Limited activity within patient's tolerance;Monitored during session;Repositioned     Hand Dominance Right   Extremity/Trunk Assessment Upper Extremity Assessment Upper Extremity Assessment: Overall WFL  for tasks assessed   Lower Extremity Assessment Lower Extremity Assessment: Defer to PT evaluation   Cervical / Trunk Assessment Cervical / Trunk Assessment: Kyphotic   Communication Communication Communication: No difficulties   Cognition Arousal/Alertness: Awake/alert Behavior During Therapy: WFL for tasks assessed/performed Overall Cognitive Status: Within Functional Limits for tasks assessed                                 General Comments: Very motivated and safety aware.   General Comments  Pt very motivated.  Son is able to help as much as needed; lives close by.     Exercises     Shoulder Instructions      Home Living Family/patient expects to be discharged to:: Private residence Living Arrangements: Alone Available Help at Discharge: Family;Friend(s);Available PRN/intermittently Type of Home: House Home Access: Ramped entrance     Home Layout: One level     Bathroom Shower/Tub: Teacher, early years/pre: Handicapped height     Home Equipment: Environmental consultant - 4 wheels;Walker - 2 wheels;Cane - single point;Bedside commode;Tub bench;Hand held shower head;Hospital bed   Additional Comments: Pt has equipment from wife who passed 6 years ago.  He uses tub bench but has access to everything.      Prior Functioning/Environment Level of Independence: Independent        Comments: cooks/cleans/drives. Son lives down street but available to assist as needed.        OT Problem List: Decreased knowledge of use of DME or AE;Impaired balance (sitting and/or standing);Pain      OT Treatment/Interventions: Self-care/ADL training;Therapeutic activities;DME and/or AE instruction;Balance training    OT Goals(Current goals can be found in the care plan section) Acute Rehab OT Goals Patient Stated Goal: home OT Goal Formulation: With patient Time For Goal Achievement: 10/29/18 Potential to Achieve Goals: Good ADL Goals Pt Will Perform Lower Body  Bathing: with supervision;sit to/from stand Pt Will Perform Lower Body Dressing: with supervision;sit to/from stand Pt Will Perform Tub/Shower Transfer: Tub transfer;with supervision;tub bench;rolling walker;ambulating Additional ADL Goal #1: Pt will walk to bathroom and toilet on high commode all with supervision.  OT Frequency: Min 2X/week   Barriers to D/C:            Co-evaluation              AM-PAC PT "6 Clicks" Daily Activity     Outcome Measure Help from another person eating meals?: None Help from another person taking care of personal grooming?: None Help from another person toileting, which includes using toliet, bedpan, or urinal?: A Little Help from another person bathing (including washing, rinsing, drying)?: A Little Help from another person to put on and taking off regular upper body clothing?: None Help from another person to put on and taking off regular lower body clothing?: A Little 6 Click Score: 21   End of Session Equipment Utilized During Treatment: Rolling walker Nurse Communication: Mobility status  Activity Tolerance: Patient tolerated treatment well Patient left: in chair;with call bell/phone within reach  OT Visit Diagnosis: Unsteadiness on feet (R26.81)  Time: 4287-6811 OT Time Calculation (min): 28 min Charges:  OT General Charges $OT Visit: 1 Visit OT Evaluation $OT Eval Moderate Complexity: 1 Mod OT Treatments $Self Care/Home Management : 8-22 mins  Jinger Neighbors, OTR/L [phone]  Glenford Peers 10/15/2018, 10:24 AM

## 2018-10-15 NOTE — Progress Notes (Addendum)
[facility]  Subjective  - doing better with left LE pain since surgery.   Objective 104/62 90 97.8 F (36.6 C) (Oral) (!) 25 98%  Intake/Output Summary (Last 24 hours) at 10/15/2018 0757 Last data filed at 10/15/2018 0400 Gross per 24 hour  Intake 2839.92 ml  Output 1805 ml  Net 1034.92 ml    Palpable DP Groin soft without hematoma Heart Irregular with known A fib Lungs non labored breathing  Assessment/Planning: POD # 1 Iliofemoral thromboembolectomy, left leg D/C foley Replenish K+ Heparin per Cardiology will follow Cr   Roxy Horseman 10/15/2018 7:57 AM --  Laboratory Lab Results: Recent Labs    10/14/18 0727 10/14/18 2135  WBC 13.8* 9.6  HGB 11.3* 10.9*  HCT 34.3* 32.3*  PLT 110* 104*   BMET Recent Labs    10/14/18 0727 10/14/18 2135  NA 134* 134*  K 3.0* 2.8*  CL 100 99  CO2 20* 18*  GLUCOSE 124* 102*  BUN 66* 51*  CREATININE 3.32* 2.40*  CALCIUM 7.2* 7.0*    COAG Lab Results  Component Value Date   INR 1.0 07/22/2008   No results found for: PTT  Acute renal failure:  Improving with IV hydration.  Foley out today AFIB:  On heparin while creatinine is elevated.   Diarrhea:  3 episodes yesterrday.  Check cdiff   Annamarie Major

## 2018-10-15 NOTE — Progress Notes (Signed)
Physical Therapy Treatment Patient Details Name: George Potter MRN: [id number] DOB: 1926/01/07 Today's Date: 10/15/2018    History of Present Illness Pt is a 84 y.o. male with a hx of HTN, HLD, longstanding persistent atrial fibrillation, and mod-severe MR. He presented to the ED with acute onset of severe left leg pain. Pt was emergently taken to the OR and underwent L iliofemoral thromboembolectomy.     PT Comments    Pt making excellent progress with mobility. Should be able to return home when medically ready.   Follow Up Recommendations  No PT follow up     Equipment Recommendations  None recommended by PT    Recommendations for Other Services       Precautions / Restrictions Precautions Precautions: None Restrictions Weight Bearing Restrictions: No    Mobility  Bed Mobility Overal bed mobility: Modified Independent Bed Mobility: Supine to Sit     Supine to sit: HOB elevated;Modified independent (Device/Increase time)        Transfers Overall transfer level: Needs assistance Equipment used: Rolling walker (2 wheeled);4-wheeled walker Transfers: Sit to/from Stand Sit to Stand: Min assist         General transfer comment: Assist to bring hips up  Ambulation/Gait Ambulation/Gait assistance: Supervision Gait Distance (Feet): 225 Feet Assistive device: 4-wheeled walker Gait Pattern/deviations: Step-through pattern;Decreased stride length Gait velocity: decr Gait velocity interpretation: >2.62 ft/sec, indicative of community ambulatory General Gait Details: Assist for safety and lines   Stairs             Wheelchair Mobility    Modified Rankin (Stroke Patients Only)       Balance Overall balance assessment: Mild deficits observed, not formally tested                                          Cognition Arousal/Alertness: Awake/alert Behavior During Therapy: WFL for tasks assessed/performed Overall Cognitive Status:  Within Functional Limits for tasks assessed                                 General Comments: Very motivated and safety aware.      Exercises      General Comments        Pertinent Vitals/Pain Pain Assessment: Faces Faces Pain Scale: Hurts a little bit Pain Location: LLE with mobility  Pain Descriptors / Indicators: Sore Pain Intervention(s): Limited activity within patient's tolerance    Home Living                      Prior Function            PT Goals (current goals can now be found in the care plan section) Progress towards PT goals: Progressing toward goals;Goals met and updated - see care plan    Frequency    Min 3X/week      PT Plan Current plan remains appropriate    Co-evaluation              AM-PAC PT "6 Clicks" Daily Activity  Outcome Measure  Difficulty turning over in bed (including adjusting bedclothes, sheets and blankets)?: A Little Difficulty moving from lying on back to sitting on the side of the bed? : A Little Difficulty sitting down on and standing up from a chair with arms (e.g., wheelchair, bedside commode,  etc,.)?: Unable Help needed moving to and from a bed to chair (including a wheelchair)?: A Little Help needed walking in hospital room?: A Little Help needed climbing 3-5 steps with a railing? : A Little 6 Click Score: 16    End of Session   Activity Tolerance: Patient tolerated treatment well Patient left: in chair;with call bell/phone within reach Nurse Communication: Mobility status PT Visit Diagnosis: Other abnormalities of gait and mobility (R26.89);Pain Pain - Right/Left: Left Pain - part of body: Leg     Time: 1470-9295 PT Time Calculation (min) (ACUTE ONLY): 19 min  Charges:  $Gait Training: 8-22 mins                     [place] Pager [phone] Office [place] 10/15/2018, 4:11 PM

## 2018-10-15 NOTE — Anesthesia Postprocedure Evaluation (Signed)
Anesthesia Post Note  Patient: George Potter  Procedure(s) Performed: Iliofemoral Thromboembolectomy Left Leg (Left )     Patient location during evaluation: PACU Anesthesia Type: General Level of consciousness: awake and alert Pain management: pain level controlled Vital Signs Assessment: post-procedure vital signs reviewed and stable Respiratory status: spontaneous breathing, nonlabored ventilation, respiratory function stable and patient connected to nasal cannula oxygen Cardiovascular status: blood pressure returned to baseline and stable Postop Assessment: no apparent nausea or vomiting Anesthetic complications: no    Last Vitals:  Vitals:   10/15/18 0400 10/15/18 0749  BP: 104/62   Pulse: 90   Resp: (!) 25   Temp:  36.6 C  SpO2: 98%     Last Pain:  Vitals:   10/15/18 0749  TempSrc: Oral  PainSc: 0-No pain                 Effie Berkshire

## 2018-10-15 NOTE — Progress Notes (Signed)
ANTICOAGULATION CONSULT NOTE - Follow Up Consult  Pharmacy Consult for Heparin Indication: afib and  S/p s/p L iliofemoral thromboembolectomy No Known Allergies  Patient Measurements: Height: 5\' 11"  (180.3 cm) Weight: 160 lb 4.4 oz (72.7 kg) IBW/kg (Calculated) : 75.3 Heparin Dosing Weight:   72.7 kg  Vital Signs: Temp: 97.7 F (36.5 C) (10/21 1130) Temp Source: Oral (10/21 1130) BP: 136/75 (10/21 1130) Pulse Rate: 83 (10/21 1130)  Labs: Recent Labs    10/14/18 0539 10/14/18 0727 10/14/18 2135 10/15/18 0756 10/15/18 1418  HGB  --  11.3* 10.9* 10.7*  --   HCT  --  34.3* 32.3* 32.0*  --   PLT  --  110* 104* 96*  --   APTT  --   --  99*  --   --   HEPARINUNFRC  --   --  0.20* 0.30 0.22*  CREATININE 4.04* 3.32* 2.40*  --   --     Estimated Creatinine Clearance: 21 mL/min (A) (by C-G formula based on SCr of 2.4 mg/dL (H)).  Assessment: Anticoag: Apixaban PTA for Afib (LD 10/19 PM per pt) - plan for apixaban but vascular request IV hep given recent procedure + elevated Scr. s/p L iliofemoral thromboembolectomy 10/20. Heparin level 0.2>0.3>0.22. Hgb down from baseline (13.6) at 10.7. Plts 96 remain low.   Goal of Therapy:  Heparin level 0.3-0.7 units/ml Monitor platelets by anticoagulation protocol: Yes   Plan:  Increase IV heparin to 1250 units/hr,  Monitor daily HL/aPTT, Scr, CBC, and for s/sx of bleeding  F/u plan to transition back to apixaban in future (dose per Afib per convo w/ cards + vascular)  George Potter, PharmD, [place] Clinical Staff Pharmacist [phone] George Potter 10/15/2018,3:30 PM

## 2018-10-16 LAB — BASIC METABOLIC PANEL
Anion gap: 8 (ref 5–15)
BUN: 31 mg/dL — ABNORMAL HIGH (ref 8–23)
CHLORIDE: 106 mmol/L (ref 98–111)
CO2: 21 mmol/L — ABNORMAL LOW (ref 22–32)
CREATININE: 1.53 mg/dL — AB (ref 0.61–1.24)
Calcium: 7.3 mg/dL — ABNORMAL LOW (ref 8.9–10.3)
GFR calc non Af Amer: 38 mL/min — ABNORMAL LOW (ref >60)
GFR, EST AFRICAN AMERICAN: 44 mL/min — AB (ref >60)
Glucose, Bld: 122 mg/dL — ABNORMAL HIGH (ref 70–99)
POTASSIUM: 3.6 mmol/L (ref 3.5–5.1)
SODIUM: 135 mmol/L (ref 135–145)

## 2018-10-16 LAB — CBC
HEMATOCRIT: 34.8 % — AB (ref 39.0–52.0)
HEMOGLOBIN: 11.2 g/dL — AB (ref 13.0–17.0)
MCH: 26.5 pg (ref 26.0–34.0)
MCHC: 32.2 g/dL (ref 30.0–36.0)
MCV: 82.5 fL (ref 80.0–100.0)
Platelets: 105 10*3/uL — ABNORMAL LOW (ref 150–400)
RBC: 4.22 MIL/uL (ref 4.22–5.81)
RDW: 15.3 % (ref 11.5–15.5)
WBC: 10 10*3/uL (ref 4.0–10.5)
nRBC: 0 % (ref 0.0–0.2)

## 2018-10-16 LAB — HEPARIN LEVEL (UNFRACTIONATED)
HEPARIN UNFRACTIONATED: 0.43 [IU]/mL (ref 0.30–0.70)
Heparin Unfractionated: 0.39 IU/mL (ref 0.30–0.70)

## 2018-10-16 MED ORDER — METOPROLOL SUCCINATE ER 25 MG PO TB24
25.0000 mg | ORAL_TABLET | Freq: Every day | ORAL | Status: DC
  Administered 2018-10-16 – 2018-10-17 (×2): 25 mg via ORAL
  Filled 2018-10-16 (×2): qty 1

## 2018-10-16 NOTE — Progress Notes (Addendum)
Progress Note  Patient Name: George Potter Date of Encounter: 10/16/2018  Primary Cardiologist: Minus Breeding, MD   Subjective   George Potter feels well this morning.  He is anxious to go home.  He had 3 loose bowel movements yesterday and has had only one small one this morning.  Inpatient Medications    Scheduled Meds: . atorvastatin  40 mg Oral Daily  . docusate sodium  100 mg Oral Daily  . irbesartan  75 mg Oral Daily  . pantoprazole  40 mg Oral Daily  . tamsulosin  0.4 mg Oral Daily   Continuous Infusions: . sodium chloride 75 mL/hr at 10/16/18 0147  . heparin 1,250 Units/hr (10/16/18 0906)  . magnesium sulfate 1 - 4 g bolus IVPB     PRN Meds: alum & mag hydroxide-simeth, bisacodyl, guaiFENesin-dextromethorphan, hydrALAZINE, HYDROcodone-acetaminophen, labetalol, magnesium sulfate 1 - 4 g bolus IVPB, metoprolol tartrate, morphine injection, ondansetron, oxyCODONE, phenol, polyvinyl alcohol, senna-docusate   Vital Signs    Vitals:   10/15/18 1959 10/16/18 0005 10/16/18 0429 10/16/18 0719  BP: 116/65 (!) 123/91 (!) 143/97 127/82  Pulse: (!) 102 98 100 (!) 105  Resp:    18  Temp: 98.4 F (36.9 C) 97.9 F (36.6 C) 97.9 F (36.6 C) 97.7 F (36.5 C)  TempSrc: Oral Oral Oral Oral  SpO2: 99% 98% 96% 100%  Weight:      Height:        Intake/Output Summary (Last 24 hours) at 10/16/2018 1011 Last data filed at 10/16/2018 0800 Gross per 24 hour  Intake 3671.92 ml  Output 125 ml  Net 3546.92 ml   Filed Weights   10/14/18 0254 10/14/18 0957  Weight: 68.5 kg 72.7 kg    Telemetry    A. fib 90s-100s- Personally Reviewed  ECG    No new tracings- Personally Reviewed  Physical Exam   GEN: No acute distress.   Neck: No JVD Cardiac:  Irregularly irregular rhythm, no murmurs, rubs, or gallops.  Respiratory: Clear to auscultation bilaterally. GI: Soft, nontender, non-distended  MS: No edema; No deformity. Neuro:  Nonfocal  Psych: Normal affect   Labs      Chemistry Recent Labs  Lab 10/14/18 0727 10/14/18 2135 10/16/18 0359  NA 134* 134* 135  K 3.0* 2.8* 3.6  CL 100 99 106  CO2 20* 18* 21*  GLUCOSE 124* 102* 122*  BUN 66* 51* 31*  CREATININE 3.32* 2.40* 1.53*  CALCIUM 7.2* 7.0* 7.3*  GFRNONAA 15* 22* 38*  GFRAA 17* 26* 44*  ANIONGAP 14 17* 8     Hematology Recent Labs  Lab 10/14/18 2135 10/15/18 0756 10/16/18 0359  WBC 9.6 9.9 10.0  RBC 4.00* 3.89* 4.22  HGB 10.9* 10.7* 11.2*  HCT 32.3* 32.0* 34.8*  MCV 80.8 82.3 82.5  MCH 27.3 27.5 26.5  MCHC 33.7 33.4 32.2  RDW 14.9 15.1 15.3  PLT 104* 96* 105*    Cardiac EnzymesNo results for input(s): TROPONINI in the last 168 hours. No results for input(s): TROPIPOC in the last 168 hours.   BNPNo results for input(s): BNP, PROBNP in the last 168 hours.   DDimer No results for input(s): DDIMER in the last 168 hours.   Radiology    No results found.  Cardiac Studies   Echocardiogram 10/15/2018 Study Conclusions - Left ventricle: The cavity size was normal. Wall thickness was   increased in a pattern of mild LVH. There was severe focal basal   hypertrophy of the septum. Systolic function was  normal. The   estimated ejection fraction was in the range of 60% to 65%. Wall   motion was normal; there were no regional wall motion   abnormalities. The study is not technically sufficient to allow   evaluation of LV diastolic function. - Aortic valve: Trileaflet. Sclerosis without stenosis. There was   mild regurgitation. - Mitral valve: Calcified annulus. Mildly thickened leaflets .   There was moderate regurgitation. - Left atrium: The atrium was normal in size. - Right atrium: The atrium was mildly dilated. - Tricuspid valve: There was moderate regurgitation. - Pulmonary arteries: PA peak pressure: 63 mm Hg (S). - Inferior vena cava: The vessel was dilated. The respirophasic   diameter changes were blunted (< 50%), consistent with elevated   central venous  pressure.  Impressions: - Compared to a prior study in 2018, the LVEF is stable. RVSP is   increased from 47 mmHg to 63 mmHg.   Patient Profile     84 y.o. male  with thrombectomy secondary to embolic phenomenon while taking Eliquis with long-standing persistent atrial fibrillation, moderate to severe mitral regurgitation, hypertension, hyperlipidemia.  Assessment & Plan    Permanent atrial fibrillation -Patient presented with acute renal insufficiency with creatinine up to 4.4, now improving with creatinine 1.53 today.  He has been on a heparin drip for anticoagulation with anticipation of conversion to Eliquis.  He was on 5 mg twice daily prior to admission.  If his creatinine gets down below 1.5 he could resume his prior dosing.  If creatinine stays greater than 1.5 he would need to reduce dosing for renal function, 2.5 mg twice daily -There is mention of his episodes of acute diarrhea leading to possible decreased absorption of his medication which may have precipitated the embolic phenomenon -Not on any rate control medications, unclear why.  He continues in A. fib with rates in the 90s-100s.  He may benefit from adding some beta-blocker.   Acute kidney injury -Possibly related to acute diarrhea after taking antibiotics.  Improving with IV hydration. -Serum creatinine has been trending down from 4.4-1.53 today  Moderate to severe mitral regurgitation -Continuing to monitor clinically.  Thrombectomy to the lower extremity -Iliofemoral thrombectomy of the left leg on 10/14/2018  Diarrhea -Following antibiotic use.  Stool was negative for C. Difficile  Hypokalemia -Likely related to frequent loose stools. -Serum potassium was 2.8 yesterday.  Received IV and oral supplementation with potassium up to 3.6 today.     For questions or updates, please contact [place] Please consult [email or web address] for contact info under        Signed, Daune Perch, NP  10/16/2018,  10:11 AM    Personally seen and examined. Agree with above.  84 year old with lower extremity thrombus status post thrombectomy with recent diarrhea after antibiotic administration, C. difficile negative.  Overall doing quite well.  He is eager to go home soon.  Lab work personally reviewed shows creatinine of 1.53 today which is just on the borderline of dose adjustment for Eliquis.  I have asked him to stay 1 more day with hydration just to make sure that we are giving him the correct dose of Eliquis when he goes home.  He is now back on his angiotensin receptor blocker.  Creatinine is markedly improved. In regards to his atrial fibrillation, he is asymptomatic and his heart rate remains in the 90s to 100s.  I will go ahead and place him on low-dose Toprol 25 mg once a day for improved rate management.  He is alert and oriented x3, in bed, comfortable, heart irregularly irregular, no significant edema, abdomen is soft.  1 more day.  Hopeful discharge tomorrow once we are confident on the dose of Eliquis.  Continue with IV heparin at this time.  Candee Furbish, MD

## 2018-10-16 NOTE — Progress Notes (Signed)
Pt had some bleeding from hemorrhoid after having bowel movement tonight.Skin Protective barrier was applied around rectal area.  Heparin gtt continue at 1250 units/ hr. Vital signs stable. Atrial fib with rate controlled on monitor.  Will monitor.  Kennyth Lose, RN

## 2018-10-16 NOTE — Progress Notes (Signed)
ANTICOAGULATION CONSULT NOTE - Follow Up Consult  Pharmacy Consult for Heparin Indication: afib and  S/p s/p L iliofemoral thromboembolectomy No Known Allergies  Patient Measurements: Height: 5\' 11"  (180.3 cm) Weight: 160 lb 4.4 oz (72.7 kg) IBW/kg (Calculated) : 75.3 Heparin Dosing Weight:   72.7 kg  Vital Signs: Temp: 97.7 F (36.5 C) (10/22 0719) Temp Source: Oral (10/22 0719) BP: 127/82 (10/22 0719) Pulse Rate: 105 (10/22 0719)  Labs: Recent Labs    10/14/18 0727  10/14/18 2135 10/15/18 0756 10/15/18 1418 10/16/18 0359  HGB 11.3*  --  10.9* 10.7*  --  11.2*  HCT 34.3*  --  32.3* 32.0*  --  34.8*  PLT 110*  --  104* 96*  --  105*  APTT  --   --  99*  --   --   --   HEPARINUNFRC  --    < > 0.20* 0.30 0.22* 0.39  CREATININE 3.32*  --  2.40*  --   --  1.53*   < > = values in this interval not displayed.    Estimated Creatinine Clearance: 33 mL/min (A) (by C-G formula based on SCr of 1.53 mg/dL (H)).  Assessment: 6 YOM s/p iliofemoral thromboembolectomy on IV heparin for permanent Afib. Patient had AKI on presentation with SCr elevated at 4.4 (BL ~ 0.9). SCr today has improved to 1.53. H/H and Plt also improved. HL this AM was therapeutic at 0.39 on 1250 units/hr of IV heparin.    Goal of Therapy:  Heparin level 0.3-0.7 units/ml Monitor platelets by anticoagulation protocol: Yes   Plan:  Continue IV heparin at 1250 units/hr F/u 8hr cHL  Monitor daily HL/aPTT, Scr, CBC, and for s/sx of bleeding  F/u plan to transition back to apixaban (dose per Afib per convo w/ cards + vascular)  Albertina Parr, PharmD., BCPS Clinical Pharmacist Clinical phone for 10/16/18 until 3:30pm: [phone] If after 3:30pm, please refer to [facility] for unit-specific pharmacist

## 2018-10-16 NOTE — Plan of Care (Signed)
  Problem: Education: Goal: Knowledge of General Education information will improve Description Including pain rating scale, medication(s)/side effects and non-pharmacologic comfort measures Outcome: Progressing   Problem: Health Behavior/Discharge Planning: Goal: Ability to manage health-related needs will improve Outcome: Progressing   Problem: Clinical Measurements: Goal: Ability to maintain clinical measurements within normal limits will improve Outcome: Progressing Goal: Diagnostic test results will improve Outcome: Progressing Goal: Cardiovascular complication will be avoided Outcome: Progressing   Problem: Activity: Goal: Risk for activity intolerance will decrease Outcome: Progressing   Problem: Nutrition: Goal: Adequate nutrition will be maintained Outcome: Progressing   Problem: Coping: Goal: Level of anxiety will decrease Outcome: Progressing   Problem: Elimination: Goal: Will not experience complications related to bowel motility Outcome: Progressing Goal: Will not experience complications related to urinary retention Outcome: Progressing   Problem: Safety: Goal: Ability to remain free from injury will improve Outcome: Progressing

## 2018-10-16 NOTE — Progress Notes (Signed)
Occupational Therapy Treatment Patient Details Name: George Potter MRN: [id number] DOB: 1926-06-01 Today's Date: 10/16/2018    History of present illness Pt is a 84 y.o. male with a hx of HTN, HLD, longstanding persistent atrial fibrillation, and mod-severe MR. He presented to the ED with acute onset of severe left leg pain. Pt was emergently taken to the OR and underwent L iliofemoral thromboembolectomy.    OT comments   Pt seen for OT ADL retraining session with focus on functonal mobility/bed mobility, toilet transfer and simulated grooming tasks and functional mobility in room. Pt is currently supervision-Min assist for tasks.   Follow Up Recommendations  Home health OT;Supervision/Assistance - 24 hour    Equipment Recommendations  None recommended by OT    Recommendations for Other Services      Precautions / Restrictions Precautions Precautions: Other (comment) Precaution Comments: watch HR  Restrictions Weight Bearing Restrictions: No       Mobility Bed Mobility Overal bed mobility: Modified Independent Bed Mobility: Sit to Supine       Sit to supine: Modified independent (Device/Increase time)   General bed mobility comments: Assist to raise HOB after transferred back to bed following ADL's. Overall Mod I  Transfers Overall transfer level: Needs assistance Equipment used: Rolling walker (2 wheeled) Transfers: Sit to/from [facility] Sit to Stand: Supervision(Close supervision) Stand pivot transfers: Supervision(Close supervision)       General transfer comment: Close supervision: VC for safety     Balance Overall balance assessment: Mild deficits observed, not formally tested                                         ADL either performed or assessed with clinical judgement   ADL Overall ADL's : Needs assistance/impaired     Grooming: Standing;Supervision/safety Grooming Details (indicate cue type and reason): Pt  walked to sink and then door and back to sink with supervision & simulated grooming task Upper Body Bathing: Set up;Sitting               Toilet Transfer: RW;Ambulation;Grab bars;Comfort height toilet;Min guard   Toileting- Clothing Manipulation and Hygiene: Supervision/safety;Sitting/lateral lean       Functional mobility during ADLs: Supervision/safety;Rolling walker General ADL Comments: Pt seen for OT ADL retraining session with focus on functonal mobility, toilet transfer and simulated grooming tasks and functional mobility in room. Pt is eager to d/c home.     Vision Baseline Vision/History: Cataracts;Wears glasses Wears Glasses: At all times Patient Visual Report: No change from baseline     Perception     Praxis      Cognition Arousal/Alertness: Awake/alert Behavior During Therapy: WFL for tasks assessed/performed Overall Cognitive Status: Within Functional Limits for tasks assessed                                 General Comments: Very motivated and safety aware.        Exercises     Shoulder Instructions       General Comments this session limited by HR; patient chronically in A-fib but during this session HR ranged from 97-128BPM at rest and with gait in room    Pertinent Vitals/ Pain       Pain Assessment: No/denies pain Pain Score: 0-No pain Faces Pain Scale: No hurt Pain Intervention(s): Limited activity  within patient's tolerance;Monitored during session  Home Living                                          Prior Functioning/Environment              Frequency  Min 2X/week        Progress Toward Goals  OT Goals(current goals can now be found in the care plan section)  Progress towards OT goals: Progressing toward goals  Acute Rehab OT Goals Patient Stated Goal: Go home  Plan Discharge plan remains appropriate    Co-evaluation                 AM-PAC PT "6 Clicks" Daily Activity      Outcome Measure   Help from another person eating meals?: None Help from another person taking care of personal grooming?: None Help from another person toileting, which includes using toliet, bedpan, or urinal?: A Little Help from another person bathing (including washing, rinsing, drying)?: A Little Help from another person to put on and taking off regular upper body clothing?: None Help from another person to put on and taking off regular lower body clothing?: A Little 6 Click Score: 21    End of Session Equipment Utilized During Treatment: Gait belt;Rolling walker  OT Visit Diagnosis: Unsteadiness on feet (R26.81)   Activity Tolerance Patient tolerated treatment well   Patient Left in bed;with call bell/phone within reach;Other (comment)(Pt requested OT raise bedrails x4 "I like those to be up". MD in room at conclusion of session)   Nurse Communication          Time: [phone] OT Time Calculation (min): 11 min  Charges: OT General Charges $OT Visit: 1 Visit OT Treatments $Self Care/Home Management : 8-22 mins    [place], Amy Beth Dixon, OTR/L 10/16/2018, 11:21 AM

## 2018-10-16 NOTE — Progress Notes (Signed)
ANTICOAGULATION CONSULT NOTE - Follow Up Consult  Pharmacy Consult for Heparin Indication: afib and  S/p s/p L iliofemoral thromboembolectomy No Known Allergies  Patient Measurements: Height: 5\' 11"  (180.3 cm) Weight: 160 lb 4.4 oz (72.7 kg) IBW/kg (Calculated) : 75.3 Heparin Dosing Weight:   72.7 kg  Vital Signs: Temp: 98.1 F (36.7 C) (10/22 1835) Temp Source: Oral (10/22 1835) BP: 107/65 (10/22 1835) Pulse Rate: 83 (10/22 1835)  Labs: Recent Labs    10/14/18 0727  10/14/18 2135 10/15/18 0756 10/15/18 1418 10/16/18 0359 10/16/18 1242  HGB 11.3*  --  10.9* 10.7*  --  11.2*  --   HCT 34.3*  --  32.3* 32.0*  --  34.8*  --   PLT 110*  --  104* 96*  --  105*  --   APTT  --   --  99*  --   --   --   --   HEPARINUNFRC  --    < > 0.20* 0.30 0.22* 0.39 0.43  CREATININE 3.32*  --  2.40*  --   --  1.53*  --    < > = values in this interval not displayed.    Estimated Creatinine Clearance: 33 mL/min (A) (by C-G formula based on SCr of 1.53 mg/dL (H)).  Assessment: 63 YOM s/p iliofemoral thromboembolectomy on IV heparin for permanent Afib. Patient had AKI on presentation with SCr elevated at 4.4 (BL ~ 0.9). SCr today has improved to 1.53. H/H and Plt also improved. HL this AM was therapeutic at 0.39 on 1250 units/hr of IV heparin.     Heparin level remains therapeutic (0.42) this afternoon on 1250 units/hr.  Goal of Therapy:  Heparin level 0.3-0.7 units/ml Monitor platelets by anticoagulation protocol: Yes   Plan:  Continue IV heparin drip at 1250 units/hr Monitor daily HL, Scr, CBC, and for s/sx of bleeding  F/u plan to transition back to apixaban (dose per Afib per convo w/ cards + vascular)  Arty Baumgartner, RPh Pager: [phone] 10/16/2018 7:23 PM

## 2018-10-16 NOTE — Progress Notes (Signed)
Patient had bleeding episodes from his hemorrhoids each time he had a bowel movement today. He voided in the commode each time without using the urinal as instructed. Bleeding under control, no sign of distress noted, protective barrier applied to the rectum will monitor.

## 2018-10-16 NOTE — Progress Notes (Signed)
Physical Therapy Treatment Patient Details Name: George Potter MRN: [id number] DOB: 04/27/1926 Today's Date: 10/16/2018    History of Present Illness Pt is a 84 y.o. male with a hx of HTN, HLD, longstanding persistent atrial fibrillation, and mod-severe MR. He presented to the ED with acute onset of severe left leg pain. Pt was emergently taken to the OR and underwent L iliofemoral thromboembolectomy.     PT Comments    Patient received up in chair, pleasant and willing to participate in PT session today. Noted HR at rest in chair to be in chronic A-fib ranging from 97-120BPM at rest. He is able to complete functional transfers with RW and min guard, limited gait to in room distances this morning due to A-fib ranging from 97-128BPM with gait short distances with RW. RN aware of HR limitations during PT session this morning. Overall ambulated 85f x3 with RW and short rest break in between each bout to allow HR range to come back down, did not progress to hallway due to safety concerns with HR range elevating this much with gait short distances. He was left up in the chair with all needs met and questions/concerns otherwise addressed this morning.     Follow Up Recommendations  No PT follow up     Equipment Recommendations  None recommended by PT    Recommendations for Other Services       Precautions / Restrictions Precautions Precautions: Other (comment) Precaution Comments: watch HR  Restrictions Weight Bearing Restrictions: No    Mobility  Bed Mobility               General bed mobility comments: OOB in chair   Transfers Overall transfer level: Needs assistance Equipment used: Rolling walker (2 wheeled) Transfers: Sit to/from Stand Sit to Stand: Min guard         General transfer comment: min guard and VC for safety   Ambulation/Gait Ambulation/Gait assistance: Supervision Gait Distance (Feet): 60 Feet(271f3 ) Assistive device: Rolling walker (2  wheeled) Gait Pattern/deviations: Step-through pattern;Decreased stride length Gait velocity: decr   General Gait Details: limited by A-fib ranging from 97-128BPM today, good safety and steadiness noted with RW    Stairs             Wheelchair Mobility    Modified Rankin (Stroke Patients Only)       Balance Overall balance assessment: Mild deficits observed, not formally tested                                          Cognition Arousal/Alertness: Awake/alert Behavior During Therapy: WFL for tasks assessed/performed Overall Cognitive Status: Within Functional Limits for tasks assessed                                        Exercises      General Comments General comments (skin integrity, edema, etc.): this session limited by HR; patient chronically in A-fib but during this session HR ranged from 97-128BPM at rest and with gait in room      Pertinent Vitals/Pain Pain Assessment: No/denies pain Pain Score: 0-No pain Faces Pain Scale: No hurt Pain Intervention(s): Limited activity within patient's tolerance;Monitored during session    Home Living  Prior Function            PT Goals (current goals can now be found in the care plan section) Acute Rehab PT Goals Patient Stated Goal: home PT Goal Formulation: With patient Time For Goal Achievement: 10/28/18 Potential to Achieve Goals: Good Progress towards PT goals: Progressing toward goals    Frequency    Min 3X/week      PT Plan Current plan remains appropriate    Co-evaluation              AM-PAC PT "6 Clicks" Daily Activity  Outcome Measure  Difficulty turning over in bed (including adjusting bedclothes, sheets and blankets)?: A Little Difficulty moving from lying on back to sitting on the side of the bed? : A Little Difficulty sitting down on and standing up from a chair with arms (e.g., wheelchair, bedside commode, etc,.)?:  A Little Help needed moving to and from a bed to chair (including a wheelchair)?: A Little Help needed walking in hospital room?: A Little Help needed climbing 3-5 steps with a railing? : A Little 6 Click Score: 18    End of Session   Activity Tolerance: Patient tolerated treatment well Patient left: in chair;with call bell/phone within reach Nurse Communication: Other (comment)(HR limitations ) PT Visit Diagnosis: Other abnormalities of gait and mobility (R26.89);Pain Pain - Right/Left: Left Pain - part of body: Leg     Time: 6720-9198 PT Time Calculation (min) (ACUTE ONLY): 24 min  Charges:  $Gait Training: 8-22 mins $Self Care/Home Management: 8-22                     Deniece Ree PT, DPT, CBIS  Supplemental Physical Therapist [place]    Pager [phone] Acute Rehab Office [phone]

## 2018-10-16 NOTE — Progress Notes (Addendum)
[facility]  Subjective  - Doing well over all no new complaint.      Objective (!) 143/97 100 97.9 F (36.6 C) (Oral) 20 96%  Intake/Output Summary (Last 24 hours) at 10/16/2018 0715 Last data filed at 10/16/2018 0600 Gross per 24 hour  Intake 3671.92 ml  Output 725 ml  Net 2946.92 ml    Left DP palpable  Left groin soft with out hematoma  Heart irregularly irregular rate 100-111 Lungs non labored breathing   Assessment/Planning: POD # 2 Iliofemoral thromboembolectomy, left leg  Voiding independently, Cr improving 1.53.  Will discontinue Flomax now. K+ replenished now 3.6  Currently on Heparin and NS @ 75 / hr.  Plan with help of Cardiology to transition to Eliquis either on 5 mg BID verses 2.5 mg BID.  BM x 2 yesterday without diarrhea.  C diff negative.   Roxy Horseman 10/16/2018 7:15 AM --  Laboratory Lab Results: Recent Labs    10/15/18 0756 10/16/18 0359  WBC 9.9 10.0  HGB 10.7* 11.2*  HCT 32.0* 34.8*  PLT 96* 105*   BMET Recent Labs    10/14/18 2135 10/16/18 0359  NA 134* 135  K 2.8* 3.6  CL 99 106  CO2 18* 21*  GLUCOSE 102* 122*  BUN 51* 31*  CREATININE 2.40* 1.53*  CALCIUM 7.0* 7.3*    COAG Lab Results  Component Value Date   INR 1.0 07/22/2008   No results found for: PTT

## 2018-10-17 ENCOUNTER — Telehealth: Payer: Self-pay | Admitting: Surgery

## 2018-10-17 LAB — CBC
HEMATOCRIT: 32.6 % — AB (ref 39.0–52.0)
Hemoglobin: 10.7 g/dL — ABNORMAL LOW (ref 13.0–17.0)
MCH: 26.9 pg (ref 26.0–34.0)
MCHC: 32.8 g/dL (ref 30.0–36.0)
MCV: 81.9 fL (ref 80.0–100.0)
Platelets: 135 10*3/uL — ABNORMAL LOW (ref 150–400)
RBC: 3.98 MIL/uL — ABNORMAL LOW (ref 4.22–5.81)
RDW: 15.4 % (ref 11.5–15.5)
WBC: 10.9 10*3/uL — AB (ref 4.0–10.5)
nRBC: 0 % (ref 0.0–0.2)

## 2018-10-17 LAB — BASIC METABOLIC PANEL
Anion gap: 13 (ref 5–15)
BUN: 23 mg/dL (ref 8–23)
CHLORIDE: 106 mmol/L (ref 98–111)
CO2: 18 mmol/L — AB (ref 22–32)
Calcium: 7 mg/dL — ABNORMAL LOW (ref 8.9–10.3)
Creatinine, Ser: 1.33 mg/dL — ABNORMAL HIGH (ref 0.61–1.24)
GFR calc Af Amer: 53 mL/min — ABNORMAL LOW (ref >60)
GFR calc non Af Amer: 45 mL/min — ABNORMAL LOW (ref >60)
GLUCOSE: 97 mg/dL (ref 70–99)
POTASSIUM: 3.1 mmol/L — AB (ref 3.5–5.1)
SODIUM: 137 mmol/L (ref 135–145)

## 2018-10-17 LAB — HEPARIN LEVEL (UNFRACTIONATED): Heparin Unfractionated: 0.42 IU/mL (ref 0.30–0.70)

## 2018-10-17 MED ORDER — APIXABAN 5 MG PO TABS
5.0000 mg | ORAL_TABLET | Freq: Two times a day (BID) | ORAL | Status: DC
  Administered 2018-10-17: 5 mg via ORAL
  Filled 2018-10-17: qty 1

## 2018-10-17 MED ORDER — POTASSIUM CHLORIDE ER 10 MEQ PO TBCR: 20.0000 meq | EXTENDED_RELEASE_TABLET | Freq: Every day | ORAL | 0 refills | Status: DC

## 2018-10-17 MED ORDER — OXYCODONE HCL 5 MG PO TABS: 5.0000 mg | ORAL_TABLET | Freq: Four times a day (QID) | ORAL | 0 refills | Status: DC | PRN

## 2018-10-17 MED ORDER — POTASSIUM CHLORIDE CRYS ER 20 MEQ PO TBCR
40.0000 meq | EXTENDED_RELEASE_TABLET | Freq: Once | ORAL | Status: DC
  Filled 2018-10-17: qty 2

## 2018-10-17 MED ORDER — METOPROLOL SUCCINATE ER 25 MG PO TB24: 25.0000 mg | ORAL_TABLET | Freq: Every day | ORAL | 3 refills | Status: AC

## 2018-10-17 NOTE — Progress Notes (Signed)
   Potassium 3.1.  Gave a one-time dose of K. Dur 40 mEq once a day.  Recommend 20 mEq daily at home.  Candee Furbish, MD

## 2018-10-17 NOTE — Progress Notes (Signed)
ANTICOAGULATION CONSULT NOTE - Follow Up Consult  Pharmacy Consult for Heparin Indication: afib and  S/p s/p L iliofemoral thromboembolectomy No Known Allergies  Patient Measurements: Height: 5\' 11"  (180.3 cm) Weight: 160 lb 4.4 oz (72.7 kg) IBW/kg (Calculated) : 75.3 Heparin Dosing Weight:   72.7 kg  Vital Signs: Temp: 98.5 F (36.9 C) (10/23 1000) Temp Source: Oral (10/23 1000) BP: 129/73 (10/23 1000) Pulse Rate: 88 (10/23 1000)  Labs: Recent Labs    10/14/18 2135 10/15/18 0756  10/16/18 0359 10/16/18 1242 10/17/18 0754  HGB 10.9* 10.7*  --  11.2*  --  10.7*  HCT 32.3* 32.0*  --  34.8*  --  32.6*  PLT 104* 96*  --  105*  --  135*  APTT 99*  --   --   --   --   --   HEPARINUNFRC 0.20* 0.30   < > 0.39 0.43 0.42  CREATININE 2.40*  --   --  1.53*  --  1.33*   < > = values in this interval not displayed.    Estimated Creatinine Clearance: 38 mL/min (A) (by C-G formula based on SCr of 1.33 mg/dL (H)).  Assessment: 44 YOM s/p iliofemoral thromboembolectomy on IV heparin for permanent Afib. Patient had AKI on presentation with SCr elevated at 4.4 (BL ~ 0.9).   Anticoag: Apixaban PTA for Afib (LD 10/19 PM per pt) - plan for apixaban but vascular request IV hep given recent procedure  s/p L iliofemoral thromboembolectomy 10/20.  HL 0.42 1250 units/hr  Renal: Scr 1.33  Heme/Onc: H&H 10.7/32.6, Plt 135  Goal of Therapy:  Heparin level 0.3-0.7 units/ml Monitor platelets by anticoagulation protocol: Yes   Plan:  Continue IV heparin at 1250 units/hr Daily HL CBC  Levester Fresh, PharmD, BCPS, BCCCP Clinical Pharmacist [phone]  Please check AMION for all [place] numbers  10/17/2018 10:12 AM

## 2018-10-17 NOTE — Progress Notes (Signed)
Progress Note  Patient Name: George Potter Date of Encounter: 10/17/2018  Primary Cardiologist: Minus Breeding, MD   Subjective   Feels well, ready to go home.  No chest pain, no shortness of breath.  Inpatient Medications    Scheduled Meds: . atorvastatin  40 mg Oral Daily  . docusate sodium  100 mg Oral Daily  . irbesartan  75 mg Oral Daily  . metoprolol succinate  25 mg Oral Daily  . pantoprazole  40 mg Oral Daily  . tamsulosin  0.4 mg Oral Daily   Continuous Infusions: . sodium chloride 75 mL/hr at 10/17/18 0438  . heparin 1,250 Units/hr (10/17/18 0523)  . magnesium sulfate 1 - 4 g bolus IVPB     PRN Meds: alum & mag hydroxide-simeth, bisacodyl, guaiFENesin-dextromethorphan, hydrALAZINE, HYDROcodone-acetaminophen, labetalol, magnesium sulfate 1 - 4 g bolus IVPB, metoprolol tartrate, morphine injection, ondansetron, oxyCODONE, phenol, polyvinyl alcohol, senna-docusate   Vital Signs    Vitals:   10/16/18 2024 10/17/18 0024 10/17/18 0434 10/17/18 1000  BP: 117/77 129/80 127/82 129/73  Pulse: 88 92 84 88  Resp:   16   Temp: 98.6 F (37 C) 98.3 F (36.8 C) 98.5 F (36.9 C) 98.5 F (36.9 C)  TempSrc: Oral Oral Oral Oral  SpO2: 97% 97% 97% 98%  Weight:      Height:        Intake/Output Summary (Last 24 hours) at 10/17/2018 1133 Last data filed at 10/17/2018 0523 Gross per 24 hour  Intake 2057.16 ml  Output 650 ml  Net 1407.16 ml   Filed Weights   10/14/18 0254 10/14/18 0957  Weight: 68.5 kg 72.7 kg    Telemetry    A. fib 90s, occasional PVCs- Personally Reviewed  ECG    No new, prior A. fib- Personally Reviewed  Physical Exam   GEN: No acute distress.  Looks younger than stated age Neck: No JVD Cardiac:  Irregularly irregular, no murmurs, rubs, or gallops.  Respiratory: Clear to auscultation bilaterally. GI: Soft, nontender, non-distended  MS: No edema; No deformity. Neuro:  Nonfocal  Psych: Normal affect   Labs    Chemistry Recent  Labs  Lab 10/14/18 2135 10/16/18 0359 10/17/18 0754  NA 134* 135 137  K 2.8* 3.6 3.1*  CL 99 106 106  CO2 18* 21* 18*  GLUCOSE 102* 122* 97  BUN 51* 31* 23  CREATININE 2.40* 1.53* 1.33*  CALCIUM 7.0* 7.3* 7.0*  GFRNONAA 22* 38* 45*  GFRAA 26* 44* 53*  ANIONGAP 17* 8 13     Hematology Recent Labs  Lab 10/15/18 0756 10/16/18 0359 10/17/18 0754  WBC 9.9 10.0 10.9*  RBC 3.89* 4.22 3.98*  HGB 10.7* 11.2* 10.7*  HCT 32.0* 34.8* 32.6*  MCV 82.3 82.5 81.9  MCH 27.5 26.5 26.9  MCHC 33.4 32.2 32.8  RDW 15.1 15.3 15.4  PLT 96* 105* 135*    Cardiac EnzymesNo results for input(s): TROPONINI in the last 168 hours. No results for input(s): TROPIPOC in the last 168 hours.   BNPNo results for input(s): BNP, PROBNP in the last 168 hours.   DDimer No results for input(s): DDIMER in the last 168 hours.   Radiology    No results found.  Cardiac Studies   EF 65%  Patient Profile     84 y.o. male with arterial thrombectomy secondary to embolic phenomenon while taking Eliquis however had several days of diarrhea causing absorption issues, moderate to severe mitral regurgitation, hypertension, hyperlipidemia  Assessment & Plan  Permanent atrial fibrillation -Creatinine has improved from 4.4 in the setting of acute kidney injury from extensive diarrhea, prerenal azotemia to 1.3 today after adequate hydration. -I am comfortable with him being discharged on Eliquis 5 mg twice a day.  He may be discharged today.  Low-dose metoprolol added yesterday to help with improved rate control.  Physical therapy acute kidney injury -Resolved with hydration  Monitor severe mitral regurgitation -Continue to monitor clinically.  Doing very well.  Thrombectomy - Ileal femoral thrombectomy 10/14/2018-status post IV heparin.  Doing well.  Okay to restart Eliquis 5 mg twice a day.  Hypothesis is that he is diarrhea at home was causing hypermotility leading to malabsorption of his  Eliquis.  Diarrhea -Improved.  Hypokalemia - Would recommend K. Dur 20 mEq once a day at home. Okay for discharge  [facility] will sign off.   Medication Recommendations: Eliquis 5 mg p.o. twice daily.  He has medication at home.  Recommend K-Dur 20 milliequivalents once a day at home.  Hypokalemia. Other recommendations (labs, testing, etc): Continue to monitor basic metabolic profile as outpatient Follow up as an outpatient: As previously scheduled  For questions or updates, please contact [facility] Please consult [email or web address] for contact info under      Signed, Candee Furbish, MD  10/17/2018, 11:33 AM

## 2018-10-17 NOTE — Discharge Instructions (Signed)
 [facility] [place]  Discharge instructions  Lower Extremity Bypass Surgery  Please refer to the following instruction for your post-procedure care. Your surgeon or physician assistant will discuss any changes with you.  Activity  You are encouraged to walk as much as you can. You can slowly return to normal activities during the month after your surgery. Avoid strenuous activity and heavy lifting until your doctor tells you it's OK. Avoid activities such as vacuuming or swinging a golf club. Do not drive until your doctor give the OK and you are no longer taking prescription pain medications. It is also normal to have difficulty with sleep habits, eating and bowel movement after surgery. These will go away with time.  Bathing/Showering  You may shower after you go home. Do not soak in a bathtub, hot tub, or swim until the incision heals completely.  Incision Care  Clean your incision with mild soap and water. Shower every day. Pat the area dry with a clean towel. You do not need a bandage unless otherwise instructed. Do not apply any ointments or creams to your incision. If you have open wounds you will be instructed how to care for them or a visiting nurse may be arranged for you. If you have staples or sutures along your incision they will be removed at your post-op appointment. You may have skin glue on your incision. Do not peel it off. It will come off on its own in about one week. If you have a great deal of moisture in your groin, use a gauze help keep this area dry.  Diet  Resume your normal diet. There are no special food restrictions following this procedure. A low fat/ low cholesterol diet is recommended for all patients with vascular disease. In order to heal from your surgery, it is CRITICAL to get adequate nutrition. Your body requires vitamins, minerals, and protein. Vegetables are the best source of vitamins and minerals. Vegetables also provide the  perfect balance of protein. Processed food has little nutritional value, so try to avoid this.  Medications  Resume taking all your medications unless your doctor or nurse practitioner tells you not to. If your incision is causing pain, you may take over-the-counter pain relievers such as acetaminophen (Tylenol). If you were prescribed a stronger pain medication, please aware these medication can cause nausea and constipation. Prevent nausea by taking the medication with a snack or meal. Avoid constipation by drinking plenty of fluids and eating foods with high amount of fiber, such as fruits, vegetables, and grains. Take Colase 100 mg (an over-the-counter stool softener) twice a day as needed for constipation. Do not take Tylenol if you are taking prescription pain medications.  Follow Up  Our office will schedule a follow up appointment 2-3 weeks following discharge.  Please call us immediately for any of the following conditions  Severe or worsening pain in your legs or feet while at rest or while walking Increase pain, redness, warmth, or drainage (pus) from your incision site(s) Fever of 101 degree or higher The swelling in your leg with the bypass suddenly worsens and becomes more painful than when you were in the hospital If you have been instructed to feel your graft pulse then you should do so every day. If you can no longer feel this pulse, call the office immediately. Not all patients are given this instruction.  Leg swelling is common after leg bypass surgery.  The swelling should improve over a few months   following surgery. To improve the swelling, you may elevate your legs above the level of your heart while you are sitting or resting. Your surgeon or physician assistant may ask you to apply an ACE wrap or wear compression (TED) stockings to help to reduce swelling.  Reduce your risk of vascular disease  Stop smoking. If you would like help call [facility] at 1-800-QUIT-NOW  (1-800-784-8669) or [facility] at 336-586-4000.  Manage your cholesterol Maintain a desired weight Control your diabetes weight Control your diabetes Keep your blood pressure down  If you have any questions, please call the office at [phone]   

## 2018-10-17 NOTE — Progress Notes (Signed)
  Progress Note    10/17/2018 10:47 AM 3 Days Post-Op  Subjective:  No new complaints   Vitals:   10/17/18 0434 10/17/18 1000  BP: 127/82 129/73  Pulse: 84 88  Resp: 16   Temp: 98.5 F (36.9 C) 98.5 F (36.9 C)  SpO2: 97% 98%   Physical Exam: Lungs:  Non labored Incisions:  L groin incision unremarkable c/d/i Extremities:  Palpable L DP Abdomen:  Soft Neurologic: A&O  CBC    Component Value Date/Time   WBC 10.9 (H) 10/17/2018 0754   RBC 3.98 (L) 10/17/2018 0754   HGB 10.7 (L) 10/17/2018 0754   HCT 32.6 (L) 10/17/2018 0754   PLT 135 (L) 10/17/2018 0754   MCV 81.9 10/17/2018 0754   MCH 26.9 10/17/2018 0754   MCHC 32.8 10/17/2018 0754   RDW 15.4 10/17/2018 0754   LYMPHSABS 1.6 10/14/2018 0306   MONOABS 0.9 10/14/2018 0306   EOSABS 0.1 10/14/2018 0306   BASOSABS 0.0 10/14/2018 0306    BMET    Component Value Date/Time   NA 137 10/17/2018 0754   K 3.1 (L) 10/17/2018 0754   CL 106 10/17/2018 0754   CO2 18 (L) 10/17/2018 0754   GLUCOSE 97 10/17/2018 0754   BUN 23 10/17/2018 0754   CREATININE 1.33 (H) 10/17/2018 0754   CALCIUM 7.0 (L) 10/17/2018 0754   GFRNONAA 45 (L) 10/17/2018 0754   GFRAA 53 (L) 10/17/2018 0754    INR    Component Value Date/Time   INR 1.0 07/22/2008 1250     Intake/Output Summary (Last 24 hours) at 10/17/2018 1047 Last data filed at 10/17/2018 0523 Gross per 24 hour  Intake 2057.16 ml  Output 650 ml  Net 1407.16 ml     Assessment/Plan:  84 y.o. male is s/p iliofemoral endarterectomy LLE 3 Days Post-Op   Perfusing LLE well with palpable L DP L groin incision unremarkable D/c to home after med recommendations and clearance from Cardiology service  Dagoberto Ligas, PA-C [facility] [phone] 10/17/2018 10:47 AM

## 2018-10-17 NOTE — Telephone Encounter (Signed)
-----   Message from Dagoberto Ligas, PA-C sent at 10/17/2018  1:22 PM EDT -----  Can you schedule an appt for this pt in 2 weeks with Dr. Trula Slade.  PO LLE thrombectomy. Thanks, [facility]

## 2018-10-17 NOTE — Progress Notes (Signed)
Occupational Therapy Treatment Patient Details Name: George Potter MRN: [id number] DOB: 1926/05/26 Today's Date: 10/17/2018    History of present illness Pt is a 84 y.o. male with a hx of HTN, HLD, longstanding persistent atrial fibrillation, and mod-severe MR. He presented to the ED with acute onset of severe left leg pain. Pt was emergently taken to the OR and underwent L iliofemoral thromboembolectomy.    OT comments  Pt progressing towards acute OT goals. Focus of session was functional mobility and strategies for LB ADLs and tub transfer. D/c plan remains appropriate.    Follow Up Recommendations  Home health OT;Supervision/Assistance - 24 hour    Equipment Recommendations  None recommended by OT    Recommendations for Other Services      Precautions / Restrictions Precautions Precautions: Other (comment);Fall Precaution Comments: watch HR  Restrictions Weight Bearing Restrictions: No       Mobility Bed Mobility Overal bed mobility: Modified Independent Bed Mobility: Sit to Supine       Sit to supine: Modified independent (Device/Increase time)      Transfers Overall transfer level: Needs assistance Equipment used: Rolling walker (2 wheeled) Transfers: Sit to/from [facility] Sit to Stand: Supervision              Balance Overall balance assessment: Mild deficits observed, not formally tested                                         ADL either performed or assessed with clinical judgement   ADL Overall ADL's : Needs assistance/impaired                                     Functional mobility during ADLs: Supervision/safety;Rolling walker General ADL Comments: Pt completed in-room functional mobility. Discussed strategies with ADLs and fall prevention.      Vision       Perception     Praxis      Cognition Arousal/Alertness: Awake/alert Behavior During Therapy: WFL for tasks  assessed/performed Overall Cognitive Status: Within Functional Limits for tasks assessed                                 General Comments: Very motivated and safety aware.        Exercises     Shoulder Instructions       General Comments son lives close by and is retired, able to help as needed    Pertinent Vitals/ Pain       Pain Assessment: No/denies pain  Home Living                                          Prior Functioning/Environment              Frequency  Min 2X/week        Progress Toward Goals  OT Goals(current goals can now be found in the care plan section)  Progress towards OT goals: Progressing toward goals  Acute Rehab OT Goals Patient Stated Goal: Go home OT Goal Formulation: With patient Time For Goal Achievement: 10/29/18 Potential to Achieve Goals: Good ADL Goals Pt Will Perform  Lower Body Bathing: with supervision;sit to/from stand Pt Will Perform Lower Body Dressing: with supervision;sit to/from stand Pt Will Perform Tub/Shower Transfer: Tub transfer;with supervision;tub bench;rolling walker;ambulating Additional ADL Goal #1: Pt will walk to bathroom and toilet on high commode all with supervision.  Plan Discharge plan remains appropriate    Co-evaluation                 AM-PAC PT "6 Clicks" Daily Activity     Outcome Measure   Help from another person eating meals?: None Help from another person taking care of personal grooming?: None Help from another person toileting, which includes using toliet, bedpan, or urinal?: None Help from another person bathing (including washing, rinsing, drying)?: A Little Help from another person to put on and taking off regular upper body clothing?: None Help from another person to put on and taking off regular lower body clothing?: A Little 6 Click Score: 22    End of Session Equipment Utilized During Treatment: Gait belt;Rolling walker  OT Visit  Diagnosis: Unsteadiness on feet (R26.81)   Activity Tolerance Patient tolerated treatment well   Patient Left in bed;with call bell/phone within reach;Other (comment)   Nurse Communication          Time: 3220-2542 OT Time Calculation (min): 11 min  Charges: OT General Charges $OT Visit: 1 Visit OT Treatments $Self Care/Home Management : 8-22 mins  Tyrone Schimke, OT Acute Rehabilitation Services Pager: [phone] Office: [phone]    Hortencia Pilar 10/17/2018, 11:55 AM

## 2018-10-17 NOTE — Telephone Encounter (Signed)
sch appt spk to pt 10/29/18 945am p/o MD

## 2018-10-17 NOTE — Consult Note (Signed)
            [facility] CM Primary Care Navigator  10/17/2018  George Potter [birth year] [id number]   Went to seepatient at the bedside to identify possible discharge needs buthe wasdischarged home per staff.  Per MD note, patient presented with acute onset of severe left leg pain underwent iliofemoral thromboembolectomy of left leg.   Patient has discharge instruction to follow-up withvascular surgery cardiology in 2 weeks (10/29/18).  Primary care provider's office is listed as providing transition of care (TOC) follow-up.   For additional questions please contact:  Edwena Felty A. Tinea Nobile, BSN, RN-BC [facility] PRIMARY CARE Navigator Cell: [phone]

## 2018-10-17 NOTE — Care Management Important Message (Signed)
Important Message  Patient Details  Name: George Potter MRN: [id number] Date of Birth: 1926-07-16   Medicare Important Message Given:  Yes    Jema Deegan P [place] 10/17/2018, 1:24 PM

## 2018-10-17 NOTE — Progress Notes (Signed)
ANTICOAGULATION CONSULT NOTE - Follow Up Consult  Pharmacy Consult for Heparin > back to Eliquis Indication: afib and  S/p s/p L iliofemoral thromboembolectomy No Known Allergies  Patient Measurements: Height: 5\' 11"  (180.3 cm) Weight: 160 lb 4.4 oz (72.7 kg) IBW/kg (Calculated) : 75.3 Heparin Dosing Weight:   72.7 kg  Vital Signs: Temp: 98.5 F (36.9 C) (10/23 1000) Temp Source: Oral (10/23 1000) BP: 129/73 (10/23 1000) Pulse Rate: 88 (10/23 1000)  Labs: Recent Labs    10/14/18 2135 10/15/18 0756  10/16/18 0359 10/16/18 1242 10/17/18 0754  HGB 10.9* 10.7*  --  11.2*  --  10.7*  HCT 32.3* 32.0*  --  34.8*  --  32.6*  PLT 104* 96*  --  105*  --  135*  APTT 99*  --   --   --   --   --   HEPARINUNFRC 0.20* 0.30   < > 0.39 0.43 0.42  CREATININE 2.40*  --   --  1.53*  --  1.33*   < > = values in this interval not displayed.    Estimated Creatinine Clearance: 38 mL/min (A) (by C-G formula based on SCr of 1.33 mg/dL (H)).  Assessment: 60 YOM s/p iliofemoral thromboembolectomy on IV heparin for permanent Afib. Patient had AKI on presentation with SCr elevated at 4.4 (BL ~ 0.9), but now improved to 1.33  Anticoag: Apixaban PTA for Afib (LD 10/19 PM per pt) - plan for apixaban but vascular request IV hep given recent procedure  s/p L iliofemoral thromboembolectomy 10/20.  HL 0.42 1250 units/hr  Renal: Scr 1.33  Heme/Onc: H&H 10.7/32.6, Plt 135  Goal of Therapy:  Heparin level 0.3-0.7 units/ml Monitor platelets by anticoagulation protocol: Yes   Plan:  D/c IV heparin.  Give Eliquis 5 mg BID starting now.  Marguerite Olea, [facility] Clinical Pharmacist Phone [phone]  10/17/2018 12:57 PM

## 2018-10-17 NOTE — Progress Notes (Signed)
Pt given AVS handout and prescriptions given. Pt verbalized understanding. IV's removed.   Pt escorted out via wheelchair with RN. Pt riding home with son.  Lilla Shook, BSN

## 2018-10-18 ENCOUNTER — Encounter (HOSPITAL_COMMUNITY): Payer: Self-pay | Admitting: Physician Assistant

## 2018-10-18 NOTE — Discharge Summary (Signed)
Physician Discharge Summary   Patient ID: George Potter [id number] 84 y.o. Dec 07, 1926  Admit date: 10/14/2018  Discharge date and time: 10/17/2018  1:59 PM   Admitting Physician: Serafina Mitchell, MD   Discharge Physician: same  Admission Diagnoses: Ischemic leg [I99.8]  Discharge Diagnoses: PAD PAF  Admission Condition: poor  Discharged Condition: fair  Indication for Admission: acute ischemia of left leg  Hospital Course: Mr. Korte is a 84 year old male who presented to the emergency department on 10/14/2018 with severe left leg pain.  He was found to have an occlusive thrombus in his left common femoral artery which was completely obstructive.  He was taken to the operating room by Dr. Trula Slade and underwent iliofemoral thrombectomy.  He tolerated the procedure well and was admitted postoperatively.  Embolic event was believed to be caused by patient's atrial fibrillation with subtherapeutic INR.  Cardiology was consulted to help with management of atrial fibrillation.  At the time of discharge recommendations from cardiology who did to resume 5 mg twice daily of Eliquis, to begin taking metoprolol 25 mg daily, and 20 mg of potassium daily.  At the time of discharge patient had a palpable left DP pulse.  Creatinine returned to baseline.  No recommendations were made for home health.  He will follow-up in office in 2 weeks with Dr. Trula Slade.  The patient will keep his previously scheduled appointment with cardiology.  He will also be prescribed 2 to 3 days of narcotic pain medication for continued postoperative pain control.  Discharge instructions were reviewed with the patient and he voiced his understanding.  He was discharged to home in stable condition.  Consults: cardiology  Treatments: surgery: iliofemoral thrombectomy of LLE by Dr. Trula Slade on 10/14/2018  Discharge Exam: See progress note 10/17/2018 Vitals:   10/17/18 0434 10/17/18 1000  BP: 127/82 129/73  Pulse: 84 88   Resp: 16   Temp: 98.5 F (36.9 C) 98.5 F (36.9 C)  SpO2: 97% 98%    Disposition: Home  Patient Instructions:  Allergies as of 10/17/2018   No Known Allergies     Medication List    STOP taking these medications   HYDROcodone-acetaminophen 5-325 MG tablet Commonly known as:  NORCO/VICODIN     TAKE these medications   atorvastatin 40 MG tablet Commonly known as:  LIPITOR Take 40 mg by mouth daily.   bismuth subsalicylate 510 CH/85ID suspension Commonly known as:  PEPTO BISMOL Take 30 mLs by mouth every 6 (six) hours as needed for diarrhea or loose stools.   cefdinir 300 MG capsule Commonly known as:  OMNICEF Take 300 mg by mouth 2 (two) times daily. Started on 10-11-18 Duration 7 days   chlorthalidone 25 MG tablet Commonly known as:  HYGROTON Take 12.5 mg by mouth daily.   ELIQUIS 5 MG Tabs tablet Generic drug:  apixaban Take 5 mg by mouth 2 (two) times daily. Notes to patient:  Take 2nd dose this evening   Fish Oil 1200 MG Caps Take 1,200 mg by mouth daily.   losartan 100 MG tablet Commonly known as:  COZAAR Take 100 mg by mouth daily.   metoprolol succinate 25 MG 24 hr tablet Commonly known as:  TOPROL-XL Take 1 tablet (25 mg total) by mouth daily.   omeprazole 20 MG capsule Commonly known as:  PRILOSEC Take 20 mg by mouth daily.   oxyCODONE 5 MG immediate release tablet Commonly known as:  Oxy IR/ROXICODONE Take 1 tablet (5 mg total) by mouth every 6 (six)  hours as needed for moderate pain.   potassium chloride 10 MEQ tablet Commonly known as:  K-DUR Take 2 tablets (20 mEq total) by mouth daily.   REFRESH 1.4-0.6 % ophthalmic solution Generic drug:  polyvinyl alcohol-povidone Place 1-2 drops into both eyes as needed (for dry eyes).   valsartan 80 MG tablet Commonly known as:  DIOVAN Take 80 mg by mouth daily.   Vitamin D3 1000 units Caps Take 2,000 Units by mouth daily.      Activity: activity as tolerated Diet: regular diet Wound  Care: keep wound clean and dry  Follow-up with Dr. Trula Slade in 2 weeks.  SignedDagoberto Ligas 10/18/2018 10:19 AM

## 2018-10-24 DIAGNOSIS — I10 Essential (primary) hypertension: Secondary | ICD-10-CM | POA: Diagnosis not present

## 2018-10-24 DIAGNOSIS — K625 Hemorrhage of anus and rectum: Secondary | ICD-10-CM | POA: Diagnosis not present

## 2018-10-24 DIAGNOSIS — I4891 Unspecified atrial fibrillation: Secondary | ICD-10-CM | POA: Diagnosis not present

## 2018-10-24 DIAGNOSIS — Z6824 Body mass index (BMI) 24.0-24.9, adult: Secondary | ICD-10-CM | POA: Diagnosis not present

## 2018-10-24 DIAGNOSIS — I749 Embolism and thrombosis of unspecified artery: Secondary | ICD-10-CM | POA: Diagnosis not present

## 2018-10-24 DIAGNOSIS — J029 Acute pharyngitis, unspecified: Secondary | ICD-10-CM | POA: Diagnosis not present

## 2018-10-29 ENCOUNTER — Other Ambulatory Visit: Payer: Self-pay

## 2018-10-29 ENCOUNTER — Encounter: Payer: Self-pay | Admitting: Surgery

## 2018-10-29 ENCOUNTER — Ambulatory Visit (INDEPENDENT_AMBULATORY_CARE_PROVIDER_SITE_OTHER): Payer: Self-pay | Admitting: Surgery

## 2018-10-29 VITALS — BP 142/92 | HR 83 | Temp 98.1°F | Resp 20 | Ht 71.0 in | Wt 149.3 lb

## 2018-10-29 DIAGNOSIS — Z6822 Body mass index (BMI) 22.0-22.9, adult: Secondary | ICD-10-CM | POA: Diagnosis not present

## 2018-10-29 DIAGNOSIS — I482 Chronic atrial fibrillation, unspecified: Secondary | ICD-10-CM

## 2018-10-29 DIAGNOSIS — I10 Essential (primary) hypertension: Secondary | ICD-10-CM | POA: Diagnosis not present

## 2018-10-29 DIAGNOSIS — N183 Chronic kidney disease, stage 3 (moderate): Secondary | ICD-10-CM | POA: Diagnosis not present

## 2018-10-29 DIAGNOSIS — R748 Abnormal levels of other serum enzymes: Secondary | ICD-10-CM | POA: Diagnosis not present

## 2018-10-29 NOTE — Progress Notes (Signed)
Patient name: AMAD MAU MRN: [id number] DOB: Oct 20, 1926 Sex: male  REASON FOR VISIT:     post op  HISTORY OF PRESENT ILLNESS:   George Potter is a 84 y.o. male who presented to the emergency department on 10/14/2018 with acute onset of severe left leg pain.  He had no motor or sensory function.  He had a history of atrial fibrillation for which he was on Eliquis.  He had been having significant diarrhea.  He was found to be in acute renal failure.  He was taken emergently to the operating room and iliofemoral thrombectomy was performed.  Immediately following the procedure, his leg pain had resolved.  He was monitored in the hospital with IV fluids to correct his renal insufficiency which did resolve.  He is also seen by cardiology who help manage his anticoagulation.  He is ultimately discharged without issues.  He is back today for follow-up.  His only complaint is some swelling in his left groin  CURRENT MEDICATIONS:    Current Outpatient Medications  Medication Sig Dispense Refill  . apixaban (ELIQUIS) 5 MG TABS tablet Take 5 mg by mouth 2 (two) times daily.    Marland Kitchen atorvastatin (LIPITOR) 40 MG tablet Take 40 mg by mouth daily.    Marland Kitchen bismuth subsalicylate (PEPTO BISMOL) 262 MG/15ML suspension Take 30 mLs by mouth every 6 (six) hours as needed for diarrhea or loose stools.    . cefdinir (OMNICEF) 300 MG capsule Take 300 mg by mouth 2 (two) times daily. Started on 10-11-18 Duration 7 days  0  . chlorthalidone (HYGROTON) 25 MG tablet Take 12.5 mg by mouth daily.  3  . Cholecalciferol (VITAMIN D3) 1000 units CAPS Take 2,000 Units by mouth daily.    Marland Kitchen losartan (COZAAR) 100 MG tablet Take 100 mg by mouth daily.  1  . metoprolol succinate (TOPROL-XL) 25 MG 24 hr tablet Take 1 tablet (25 mg total) by mouth daily. 30 tablet 3  . Omega-3 Fatty Acids (FISH OIL) 1200 MG CAPS Take 1,200 mg by mouth daily.    . polyvinyl alcohol-povidone (REFRESH) 1.4-0.6 %  ophthalmic solution Place 1-2 drops into both eyes as needed (for dry eyes).    . potassium chloride (K-DUR) 10 MEQ tablet Take 2 tablets (20 mEq total) by mouth daily. 60 tablet 0  . valsartan (DIOVAN) 80 MG tablet Take 80 mg by mouth daily.     No current facility-administered medications for this visit.     REVIEW OF SYSTEMS:   [X]  denotes positive finding, [ ]  denotes negative finding Cardiac  Comments:  Chest pain or chest pressure:    Shortness of breath upon exertion:    Short of breath when lying flat:    Irregular heart rhythm:    Constitutional    Fever or chills:      PHYSICAL EXAM:   Vitals:   10/29/18 0946  BP: (!) 142/92  Pulse: 83  Resp: 20  Temp: 98.1 F (36.7 C)  TempSrc: Oral  SpO2: 100%  Weight: 149 lb 4.8 oz (67.7 kg)  Height: 5\' 11"  (1.803 m)    GENERAL: The patient is a well-nourished male, in no acute distress. The vital signs are documented above. CARDIOVASCULAR: There is a regular rate and rhythm. PULMONARY: Non-labored respirations Likely left groin seroma without drainage or erythema Extremities are well-perfused  STUDIES:   None   MEDICAL ISSUES:   Status post left iliofemoral embolectomy for an acutely ischemic leg.  The patient is  back on his anticoagulation.  His renal issues have resolved.  He does have a seroma in the left groin.  I discussed management options including drainage versus observation.  At this time because it is not bothering him and it is not draining, we will monitor it.  This should resolve with time.  I will see him back in 3 months.  If he has any issues between now and then, he will contact me.  Annamarie Major, MD [facility] [phone] Pager [phone]

## 2018-11-07 DIAGNOSIS — I749 Embolism and thrombosis of unspecified artery: Secondary | ICD-10-CM | POA: Diagnosis not present

## 2018-11-07 DIAGNOSIS — Z7901 Long term (current) use of anticoagulants: Secondary | ICD-10-CM | POA: Diagnosis not present

## 2018-11-07 DIAGNOSIS — Z6822 Body mass index (BMI) 22.0-22.9, adult: Secondary | ICD-10-CM | POA: Diagnosis not present

## 2018-11-07 DIAGNOSIS — R748 Abnormal levels of other serum enzymes: Secondary | ICD-10-CM | POA: Diagnosis not present

## 2018-11-07 DIAGNOSIS — I10 Essential (primary) hypertension: Secondary | ICD-10-CM | POA: Diagnosis not present

## 2018-11-07 DIAGNOSIS — N183 Chronic kidney disease, stage 3 (moderate): Secondary | ICD-10-CM | POA: Diagnosis not present

## 2018-12-28 ENCOUNTER — Other Ambulatory Visit: Payer: Self-pay | Admitting: *Deleted

## 2018-12-28 DIAGNOSIS — I739 Peripheral vascular disease, unspecified: Secondary | ICD-10-CM

## 2019-02-04 ENCOUNTER — Other Ambulatory Visit: Payer: Self-pay

## 2019-02-04 ENCOUNTER — Ambulatory Visit (INDEPENDENT_AMBULATORY_CARE_PROVIDER_SITE_OTHER): Payer: Medicare Other | Admitting: Surgery

## 2019-02-04 ENCOUNTER — Ambulatory Visit (HOSPITAL_COMMUNITY)
Admission: RE | Admit: 2019-02-04 | Discharge: 2019-02-04 | Disposition: A | Discharge status: Home / Self Care | Payer: Medicare Other | Source: Ambulatory Visit | Attending: Surgery | Admitting: Surgery

## 2019-02-04 ENCOUNTER — Encounter: Payer: Self-pay | Admitting: Surgery

## 2019-02-04 VITALS — BP 159/80 | HR 65 | Resp 18 | Ht 71.0 in | Wt 149.3 lb

## 2019-02-04 DIAGNOSIS — I739 Peripheral vascular disease, unspecified: Secondary | ICD-10-CM | POA: Insufficient documentation

## 2019-02-04 DIAGNOSIS — I482 Chronic atrial fibrillation, unspecified: Secondary | ICD-10-CM

## 2019-02-04 NOTE — Progress Notes (Signed)
[facility] [place]  Patient name: George Potter MRN: [id number] DOB: [birth year] Sex: male   REASON FOR VISIT:    Follow up  HISOTRY OF PRESENT ILLNESS:    George Potter is a 85 y.o. male who presented to the emergency department on 10/14/2018 with acute onset of severe left leg pain.  He had no motor or sensory function.  He had a history of atrial fibrillation for which he was on Eliquis.  He had been having significant diarrhea.  He was found to be in acute renal failure.  He was taken emergently to the operating room and iliofemoral thrombectomy was performed.  Immediately following the procedure, his leg pain had resolved.  He was monitored in the hospital with IV fluids to correct his renal insufficiency which did resolve.  He is also seen by cardiology who help manage his anticoagulation.  He is ultimately discharged without issue at his last visit, he had a left groin seroma which we elected to monitor.  He feels that this has decreased in size.  PAST MEDICAL HISTORY:   Past Medical History:  Diagnosis Date  . Atrial fibrillation, chronic    a. on Eliquis  . Diverticulosis   . HTN (hypertension)    Mild  . Severe mitral regurgitation      FAMILY HISTORY:   Family History  Problem Relation Age of Onset  . Atrial fibrillation Neg Hx     SOCIAL HISTORY:   Social History   Tobacco Use  . Smoking status: Never Smoker  . Smokeless tobacco: Never Used  Substance Use Topics  . Alcohol use: Not on file     ALLERGIES:   No Known Allergies   CURRENT MEDICATIONS:   Current Outpatient Medications  Medication Sig Dispense Refill  . apixaban (ELIQUIS) 5 MG TABS tablet Take 5 mg by mouth 2 (two) times daily.    Marland Kitchen atorvastatin (LIPITOR) 40 MG tablet Take 40 mg by mouth daily.    Marland Kitchen bismuth subsalicylate (PEPTO BISMOL) 262 MG/15ML suspension Take 30 mLs by mouth every 6 (six) hours as needed for diarrhea or  loose stools.    . cefdinir (OMNICEF) 300 MG capsule Take 300 mg by mouth 2 (two) times daily. Started on 10-11-18 Duration 7 days  0  . chlorthalidone (HYGROTON) 25 MG tablet Take 12.5 mg by mouth daily.  3  . Cholecalciferol (VITAMIN D3) 1000 units CAPS Take 2,000 Units by mouth daily.    Marland Kitchen losartan (COZAAR) 100 MG tablet Take 100 mg by mouth daily.  1  . metoprolol succinate (TOPROL-XL) 25 MG 24 hr tablet Take 1 tablet (25 mg total) by mouth daily. 30 tablet 3  . Omega-3 Fatty Acids (FISH OIL) 1200 MG CAPS Take 1,200 mg by mouth daily.    . polyvinyl alcohol-povidone (REFRESH) 1.4-0.6 % ophthalmic solution Place 1-2 drops into both eyes as needed (for dry eyes).    . potassium chloride (K-DUR) 10 MEQ tablet Take 2 tablets (20 mEq total) by mouth daily. 60 tablet 0  . valsartan (DIOVAN) 80 MG tablet Take 80 mg by mouth daily.     No current facility-administered medications for this visit.     REVIEW OF SYSTEMS:   [X]  denotes positive finding, [ ]  denotes negative finding Cardiac  Comments:  Chest pain or chest pressure:    Shortness of breath upon exertion:    Short of breath when lying flat:    Irregular heart rhythm:  Vascular    Pain in calf, thigh, or hip brought on by ambulation:    Pain in feet at night that wakes you up from your sleep:     Blood clot in your veins:    Leg swelling:         Pulmonary    Oxygen at home:    Productive cough:     Wheezing:         Neurologic    Sudden weakness in arms or legs:     Sudden numbness in arms or legs:     Sudden onset of difficulty speaking or slurred speech:    Temporary loss of vision in one eye:     Problems with dizziness:         Gastrointestinal    Blood in stool:     Vomited blood:         Genitourinary    Burning when urinating:     Blood in urine:        Psychiatric    Major depression:         Hematologic    Bleeding problems:    Problems with blood clotting too easily:        Skin    Rashes  or ulcers:        Constitutional    Fever or chills:      PHYSICAL EXAM:   Vitals:   02/04/19 1325  BP: (!) 159/80  Pulse: 65  Resp: 18  SpO2: 100%  Weight: 149 lb 4.8 oz (67.7 kg)  Height: 5\' 11"  (1.803 m)    GENERAL: The patient is a well-nourished male, in no acute distress. The vital signs are documented above. CARDIAC: There is a regular rate and rhythm.  VASCULAR: Decrease in left groin seroma.  Palpable femoral pulse. PULMONARY: Non-labored respirations MUSCULOSKELETAL: There are no major deformities or cyanosis. NEUROLOGIC: No focal weakness or paresthesias are detected. SKIN: There are no ulcers or rashes noted. PSYCHIATRIC: The patient has a normal affect.  STUDIES:   I have ordered and reviewed his vascular studies with the following findings: Left: 50-74% stenosis noted in the superficial femoral artery. Patent left common iliac, external iliac, and common femoral arteries with no evidence of residual thrombus at the sight if the previous thromboembolectomy. Elevated velocities at the  superficial femoral artery origin suggest 50-74% stenosis.  MEDICAL ISSUES:   Status post left iliofemoral thromboembolectomy for an acutely ischemic left leg secondary to atrial fibrillation.  He is now back on his anticoagulation.  The seroma in his left groin appears to be decreasing in size and is not bothering him.  Therefore we will continue to monitor this.  Ultrasound today revealed moderate stenosis within the superficial femoral artery.  The patient is asymptomatic.  He will follow-up with [place] in 1 year for surveillance.    Annamarie Major, MD [facility] [phone] Pager [phone]

## 2019-02-18 DIAGNOSIS — D485 Neoplasm of uncertain behavior of skin: Secondary | ICD-10-CM | POA: Diagnosis not present

## 2019-02-18 DIAGNOSIS — C44212 Basal cell carcinoma of skin of right ear and external auricular canal: Secondary | ICD-10-CM | POA: Diagnosis not present

## 2019-02-18 DIAGNOSIS — L989 Disorder of the skin and subcutaneous tissue, unspecified: Secondary | ICD-10-CM | POA: Diagnosis not present

## 2019-02-18 DIAGNOSIS — R238 Other skin changes: Secondary | ICD-10-CM | POA: Diagnosis not present

## 2019-06-26 ENCOUNTER — Telehealth: Payer: Self-pay | Admitting: *Deleted

## 2019-06-26 NOTE — Telephone Encounter (Signed)
George Potter, refused appointment,he stated he haven't seen [place] before and  he's doing okay.

## 2019-08-07 DIAGNOSIS — E7849 Other hyperlipidemia: Secondary | ICD-10-CM | POA: Diagnosis not present

## 2019-08-07 DIAGNOSIS — R82998 Other abnormal findings in urine: Secondary | ICD-10-CM | POA: Diagnosis not present

## 2019-08-07 DIAGNOSIS — R7301 Impaired fasting glucose: Secondary | ICD-10-CM | POA: Diagnosis not present

## 2019-08-07 DIAGNOSIS — M109 Gout, unspecified: Secondary | ICD-10-CM | POA: Diagnosis not present

## 2019-08-07 DIAGNOSIS — Z125 Encounter for screening for malignant neoplasm of prostate: Secondary | ICD-10-CM | POA: Diagnosis not present

## 2019-08-07 DIAGNOSIS — M859 Disorder of bone density and structure, unspecified: Secondary | ICD-10-CM | POA: Diagnosis not present

## 2019-08-07 DIAGNOSIS — I10 Essential (primary) hypertension: Secondary | ICD-10-CM | POA: Diagnosis not present

## 2019-08-08 DIAGNOSIS — D509 Iron deficiency anemia, unspecified: Secondary | ICD-10-CM | POA: Diagnosis not present

## 2019-08-08 DIAGNOSIS — K625 Hemorrhage of anus and rectum: Secondary | ICD-10-CM | POA: Diagnosis not present

## 2019-08-08 DIAGNOSIS — D649 Anemia, unspecified: Secondary | ICD-10-CM | POA: Diagnosis not present

## 2019-08-14 DIAGNOSIS — Z23 Encounter for immunization: Secondary | ICD-10-CM | POA: Diagnosis not present

## 2019-08-14 DIAGNOSIS — N183 Chronic kidney disease, stage 3 (moderate): Secondary | ICD-10-CM | POA: Diagnosis not present

## 2019-08-14 DIAGNOSIS — Z1339 Encounter for screening examination for other mental health and behavioral disorders: Secondary | ICD-10-CM | POA: Diagnosis not present

## 2019-08-14 DIAGNOSIS — R809 Proteinuria, unspecified: Secondary | ICD-10-CM | POA: Diagnosis not present

## 2019-08-14 DIAGNOSIS — G609 Hereditary and idiopathic neuropathy, unspecified: Secondary | ICD-10-CM | POA: Diagnosis not present

## 2019-08-14 DIAGNOSIS — M8580 Other specified disorders of bone density and structure, unspecified site: Secondary | ICD-10-CM | POA: Diagnosis not present

## 2019-08-14 DIAGNOSIS — R748 Abnormal levels of other serum enzymes: Secondary | ICD-10-CM | POA: Diagnosis not present

## 2019-08-14 DIAGNOSIS — Z7901 Long term (current) use of anticoagulants: Secondary | ICD-10-CM | POA: Diagnosis not present

## 2019-08-14 DIAGNOSIS — I4891 Unspecified atrial fibrillation: Secondary | ICD-10-CM | POA: Diagnosis not present

## 2019-08-14 DIAGNOSIS — D509 Iron deficiency anemia, unspecified: Secondary | ICD-10-CM | POA: Diagnosis not present

## 2019-08-14 DIAGNOSIS — K625 Hemorrhage of anus and rectum: Secondary | ICD-10-CM | POA: Diagnosis not present

## 2019-08-14 DIAGNOSIS — I749 Embolism and thrombosis of unspecified artery: Secondary | ICD-10-CM | POA: Diagnosis not present

## 2019-08-14 DIAGNOSIS — Z1331 Encounter for screening for depression: Secondary | ICD-10-CM | POA: Diagnosis not present

## 2019-08-14 DIAGNOSIS — Z0000 Encounter for general adult medical examination without abnormal findings: Secondary | ICD-10-CM | POA: Diagnosis not present

## 2019-08-15 ENCOUNTER — Encounter (HOSPITAL_COMMUNITY): Payer: Medicare Other

## 2019-09-10 DIAGNOSIS — D509 Iron deficiency anemia, unspecified: Secondary | ICD-10-CM | POA: Diagnosis not present

## 2019-09-10 DIAGNOSIS — M8589 Other specified disorders of bone density and structure, multiple sites: Secondary | ICD-10-CM | POA: Diagnosis not present

## 2019-09-20 ENCOUNTER — Other Ambulatory Visit: Payer: Self-pay

## 2019-09-25 ENCOUNTER — Emergency Department (HOSPITAL_COMMUNITY)
Admission: EM | Admit: 2019-09-25 | Discharge: 2019-09-25 | Disposition: A | Discharge status: Home / Self Care | Payer: Medicare Other | Attending: Emergency Medicine | Admitting: Emergency Medicine

## 2019-09-25 ENCOUNTER — Emergency Department (HOSPITAL_COMMUNITY): Payer: Medicare Other

## 2019-09-25 ENCOUNTER — Encounter (HOSPITAL_COMMUNITY): Payer: Self-pay | Admitting: Family Medicine

## 2019-09-25 DIAGNOSIS — I10 Essential (primary) hypertension: Secondary | ICD-10-CM | POA: Diagnosis not present

## 2019-09-25 DIAGNOSIS — M65831 Other synovitis and tenosynovitis, right forearm: Secondary | ICD-10-CM | POA: Diagnosis not present

## 2019-09-25 DIAGNOSIS — M25531 Pain in right wrist: Secondary | ICD-10-CM | POA: Insufficient documentation

## 2019-09-25 DIAGNOSIS — Z79899 Other long term (current) drug therapy: Secondary | ICD-10-CM | POA: Insufficient documentation

## 2019-09-25 DIAGNOSIS — Z7901 Long term (current) use of anticoagulants: Secondary | ICD-10-CM | POA: Insufficient documentation

## 2019-09-25 DIAGNOSIS — R2231 Localized swelling, mass and lump, right upper limb: Secondary | ICD-10-CM | POA: Diagnosis present

## 2019-09-25 DIAGNOSIS — M19031 Primary osteoarthritis, right wrist: Secondary | ICD-10-CM | POA: Diagnosis not present

## 2019-09-25 LAB — URIC ACID: Uric Acid, Serum: 7.2 mg/dL (ref 3.7–8.6)

## 2019-09-25 LAB — CBC WITH DIFFERENTIAL/PLATELET
Abs Immature Granulocytes: 0.19 10*3/uL — ABNORMAL HIGH (ref 0.00–0.07)
Basophils Absolute: 0 10*3/uL (ref 0.0–0.1)
Basophils Relative: 0 %
Eosinophils Absolute: 0 10*3/uL (ref 0.0–0.5)
Eosinophils Relative: 0 %
HCT: 36.5 % — ABNORMAL LOW (ref 39.0–52.0)
Hemoglobin: 11.5 g/dL — ABNORMAL LOW (ref 13.0–17.0)
Immature Granulocytes: 3 %
Lymphocytes Relative: 23 %
Lymphs Abs: 1.6 10*3/uL (ref 0.7–4.0)
MCH: 25.8 pg — ABNORMAL LOW (ref 26.0–34.0)
MCHC: 31.5 g/dL (ref 30.0–36.0)
MCV: 81.8 fL (ref 80.0–100.0)
Monocytes Absolute: 0.8 10*3/uL (ref 0.1–1.0)
Monocytes Relative: 12 %
Neutro Abs: 4.2 10*3/uL (ref 1.7–7.7)
Neutrophils Relative %: 62 %
Platelets: 170 10*3/uL (ref 150–400)
RBC: 4.46 MIL/uL (ref 4.22–5.81)
RDW: 20.6 % — ABNORMAL HIGH (ref 11.5–15.5)
WBC: 6.8 10*3/uL (ref 4.0–10.5)
nRBC: 0 % (ref 0.0–0.2)

## 2019-09-25 LAB — BASIC METABOLIC PANEL
Anion gap: 11 (ref 5–15)
BUN: 17 mg/dL (ref 8–23)
CO2: 25 mmol/L (ref 22–32)
Calcium: 8.6 mg/dL — ABNORMAL LOW (ref 8.9–10.3)
Chloride: 93 mmol/L — ABNORMAL LOW (ref 98–111)
Creatinine, Ser: 1.41 mg/dL — ABNORMAL HIGH (ref 0.61–1.24)
GFR calc Af Amer: 50 mL/min — ABNORMAL LOW (ref >60)
GFR calc non Af Amer: 43 mL/min — ABNORMAL LOW (ref >60)
Glucose, Bld: 128 mg/dL — ABNORMAL HIGH (ref 70–99)
Potassium: 2.9 mmol/L — ABNORMAL LOW (ref 3.5–5.1)
Sodium: 129 mmol/L — ABNORMAL LOW (ref 135–145)

## 2019-09-25 LAB — C-REACTIVE PROTEIN: CRP: 10.1 mg/dL — ABNORMAL HIGH (ref <1.0)

## 2019-09-25 LAB — SEDIMENTATION RATE: Sed Rate: 29 mm/hr — ABNORMAL HIGH (ref 0–16)

## 2019-09-25 MED ORDER — LIDOCAINE HCL 2 % IJ SOLN
10.0000 mL | Freq: Once | INTRAMUSCULAR | Status: AC
  Administered 2019-09-25: 200 mg
  Filled 2019-09-25: qty 20

## 2019-09-25 MED ORDER — OXYCODONE-ACETAMINOPHEN 5-325 MG PO TABS
0.5000 | ORAL_TABLET | Freq: Once | ORAL | Status: AC
  Administered 2019-09-25: 0.5 via ORAL
  Filled 2019-09-25: qty 1

## 2019-09-25 NOTE — ED Triage Notes (Signed)
Patient is complaining of right wrist pain and swelling that started 2 days ago. Strong radial pulse. Skin is warm but not red. Denies numbness and tingling.

## 2019-09-25 NOTE — ED Notes (Signed)
Pt transported to x-ray. Visitor at bedside

## 2019-09-25 NOTE — ED Provider Notes (Signed)
[facility] DEPT Provider Note   CSN: [id number] Arrival date & time: 09/25/19  0155     History   Chief Complaint Chief Complaint  Patient presents with  . Joint Swelling    HPI George Potter is a 85 y.o. male.     Patient presents to the emergency department for evaluation of right wrist pain.  Patient reports that it started around 2 weeks ago, at that time he had mild pain and he just thought he slept on it wrong.  Since then it has progressively worsened and then in the last 2 days it has become severe.  It is now red and swollen, is very painful to touch or with any movement.     Past Medical History:  Diagnosis Date  . Atrial fibrillation, chronic    a. on Eliquis  . Diverticulosis   . HTN (hypertension)    Mild  . Severe mitral regurgitation     Patient Active Problem List   Diagnosis Date Noted  . PAD (peripheral artery disease) ([place]) 10/14/2018  . Atrial fibrillation, chronic 10/14/2018  . HTN (hypertension) 10/14/2018  . Mitral regurgitation 10/14/2018    Past Surgical History:  Procedure Laterality Date  . BACK SURGERY     Partial facetectomy and decompression L4-L5  . CATARACT EXTRACTION Right 2001  . HERNIA REPAIR Right 07/25/2008   Right inguinal herniorrhaphy using a large polypropylene  . INTRAOCULAR LENS INSERTION Right 2001  . NASAL SINUS SURGERY     multiple surgeries  . THROMBECTOMY FEMORAL ARTERY Left 10/14/2018   Procedure: Iliofemoral Thromboembolectomy Left Leg;  Surgeon: Serafina Mitchell, MD;  Location: [facility] OR;  Service: Vascular;  Laterality: Left;        Home Medications    Prior to Admission medications   Medication Sig Start Date End Date Taking? Authorizing Provider  apixaban (ELIQUIS) 5 MG TABS tablet Take 5 mg by mouth 2 (two) times daily.    [provider]  atorvastatin (LIPITOR) 40 MG tablet Take 40 mg by mouth daily.    [provider]  bismuth subsalicylate (PEPTO  BISMOL) 262 MG/15ML suspension Take 30 mLs by mouth every 6 (six) hours as needed for diarrhea or loose stools.    [provider]  cefdinir (OMNICEF) 300 MG capsule Take 300 mg by mouth 2 (two) times daily. Started on 10-11-18 Duration 7 days 10/11/18   [provider]  chlorthalidone (HYGROTON) 25 MG tablet Take 12.5 mg by mouth daily. 08/15/18   [provider]  Cholecalciferol (VITAMIN D3) 1000 units CAPS Take 2,000 Units by mouth daily.    [provider]  losartan (COZAAR) 100 MG tablet Take 100 mg by mouth daily. 07/17/18   [provider]  metoprolol succinate (TOPROL-XL) 25 MG 24 hr tablet Take 1 tablet (25 mg total) by mouth daily. 10/18/18   Dagoberto Ligas, PA-C  Omega-3 Fatty Acids (FISH OIL) 1200 MG CAPS Take 1,200 mg by mouth daily.    [provider]  polyvinyl alcohol-povidone (REFRESH) 1.4-0.6 % ophthalmic solution Place 1-2 drops into both eyes as needed (for dry eyes).    [provider]  potassium chloride (K-DUR) 10 MEQ tablet Take 2 tablets (20 mEq total) by mouth daily. 10/17/18   Dagoberto Ligas, PA-C  valsartan (DIOVAN) 80 MG tablet Take 80 mg by mouth daily.    [provider]    Family History Family History  Problem Relation Age of Onset  . Atrial fibrillation Neg  Hx     Social History Social History   Tobacco Use  . Smoking status: Never Smoker  . Smokeless tobacco: Never Used  Substance Use Topics  . Alcohol use: Not on file  . Drug use: Not on file     Allergies   Patient has no known allergies.   Review of Systems Review of Systems  Musculoskeletal: Positive for arthralgias.  All other systems reviewed and are negative.    Physical Exam Updated Vital Signs BP 133/61   Pulse 84   Temp 98.7 F (37.1 C) (Oral)   Resp 17   SpO2 97%   Physical Exam Vitals signs and nursing note reviewed.  Constitutional:      General: He is not in acute distress.    Appearance:  Normal appearance. He is well-developed.  HENT:     Head: Normocephalic and atraumatic.     Right Ear: Hearing normal.     Left Ear: Hearing normal.     Nose: Nose normal.  Eyes:     Conjunctiva/sclera: Conjunctivae normal.     Pupils: Pupils are equal, round, and reactive to light.  Neck:     Musculoskeletal: Normal range of motion and neck supple.  Cardiovascular:     Rate and Rhythm: Regular rhythm.     Heart sounds: S1 normal and S2 normal. No murmur. No friction rub. No gallop.   Pulmonary:     Effort: Pulmonary effort is normal. No respiratory distress.     Breath sounds: Normal breath sounds.  Chest:     Chest wall: No tenderness.  Abdominal:     General: Bowel sounds are normal.     Palpations: Abdomen is soft.     Tenderness: There is no abdominal tenderness. There is no guarding or rebound. Negative signs include Murphy's sign and McBurney's sign.     Hernia: No hernia is present.  Musculoskeletal:     Right wrist: He exhibits decreased range of motion, tenderness and swelling.  Skin:    General: Skin is warm and dry.     Findings: Erythema (right wrist) present. No rash.  Neurological:     Mental Status: He is alert and oriented to person, place, and time.     GCS: GCS eye subscore is 4. GCS verbal subscore is 5. GCS motor subscore is 6.     Cranial Nerves: No cranial nerve deficit.     Sensory: No sensory deficit.     Coordination: Coordination normal.  Psychiatric:        Speech: Speech normal.        Behavior: Behavior normal.        Thought Content: Thought content normal.      ED Treatments / Results  Labs (all labs ordered are listed, but only abnormal results are displayed) Labs Reviewed  CBC WITH DIFFERENTIAL/PLATELET - Abnormal; Notable for the following components:      Result Value   Hemoglobin 11.5 (*)    HCT 36.5 (*)    MCH 25.8 (*)    RDW 20.6 (*)    Abs Immature Granulocytes 0.19 (*)    All other components within normal limits  BASIC  METABOLIC PANEL - Abnormal; Notable for the following components:   Sodium 129 (*)    Potassium 2.9 (*)    Chloride 93 (*)    Glucose, Bld 128 (*)    Creatinine, Ser 1.41 (*)    Calcium 8.6 (*)    GFR calc non Af Amer 43 (*)  GFR calc Af Amer 50 (*)    All other components within normal limits  C-REACTIVE PROTEIN - Abnormal; Notable for the following components:   CRP 10.1 (*)    All other components within normal limits  BODY FLUID CULTURE  URIC ACID  SEDIMENTATION RATE    EKG None  Radiology Dg Wrist Complete Right  Result Date: 09/25/2019 CLINICAL DATA:  Wrist pain and swelling for 2 weeks EXAM: RIGHT WRIST - COMPLETE 3+ VIEW COMPARISON:  06/18/2017 FINDINGS: There is no evidence of acute fracture or dislocation. Probable remote fifth metacarpal neck fracture. Chondrocalcinosis without erosion. Degenerative joint narrowing and spurring with subchondral cystic change at the wrist, STT joint, and first CMC joint. Generalized osteopenia IMPRESSION: 1. No acute finding. 2. Osteoarthritis as described. Electronically Signed   By: Monte Fantasia M.D.   On: 09/25/2019 04:18    Procedures .Joint Aspiration/Arthrocentesis  Date/Time: 09/25/2019 6:51 AM Performed by: Orpah Greek, MD Authorized by: Orpah Greek, MD   Consent:    Consent obtained:  Verbal   Consent given by:  Patient   Risks discussed:  Bleeding, infection and pain Universal protocol:    Procedure explained and questions answered to patient or proxy's satisfaction: yes     Relevant documents present and verified: yes     Test results available and properly labeled: yes     Imaging studies available: yes     Required blood products, implants, devices, and special equipment available: yes     Site/side marked: yes     Immediately prior to procedure, a time out was called: yes     Patient identity confirmed:  Verbally with patient Location:    Location:  Wrist   Wrist:  R radiocarpal  Anesthesia (see MAR for exact dosages):    Anesthesia method:  Local infiltration   Local anesthetic:  Lidocaine 2% w/o epi Procedure details:    Preparation: Patient was prepped and draped in usual sterile fashion     Needle gauge: 21.   Ultrasound guidance: no     Approach: Needle introduced just ulnar to extensor pollicis longus at joint space.   Aspirate amount:  1 drop   Aspirate characteristics:  Serous   Steroid injected: no     Specimen collected: yes   Post-procedure details:    Dressing:  Adhesive bandage   Patient tolerance of procedure:  Tolerated well, no immediate complications   (including critical care time)  Medications Ordered in ED Medications  oxyCODONE-acetaminophen (PERCOCET/ROXICET) 5-325 MG per tablet 0.5 tablet (0.5 tablets Oral Given 09/25/19 0532)  lidocaine (XYLOCAINE) 2 % (with pres) injection 200 mg (200 mg Infiltration Given 09/25/19 0534)     Initial Impression / Assessment and Plan / ED Course  I have reviewed the triage vital signs and the nursing notes.  Pertinent labs & imaging results that were available during my care of the patient were reviewed by me and considered in my medical decision making (see chart for details).        Patient presents to the emergency department for evaluation of right wrist pain.  Patient denies any trauma.  Examination reveals erythema and redness dorsally centered around the wrist joint.  Findings appear to be consistent with erythema secondary to joint inflammation, does not resemble cellulitis.  Differential diagnosis would be arthritis including gout as well as septic arthritis.  Patient does have decreased range of motion of the wrist secondary to pain.  Patient has never had gout.  Serum uric  acid was normal.  He does not have a leukocytosis or fever.  C-reactive protein is elevated above 10, sed rate pending.  Patient is on Eliquis because of a history of blood clot.  I did discuss risks and benefits of  arthrocentesis with him.  I did feel that there was low risk of complications from arthrocentesis with his Eliquis use, but high risk of problems of septic arthritis was missed.  After discussing all the risks and benefits he did give consent to the procedure.  Arthrocentesis was performed but only a very small amount of serous synovial fluid was obtained.  This was not enough for routine studies, sent for culture.  Presentation, labs and findings discussed with Dr. Amedeo Plenty, on-call for hand surgery.  He recommends that he see the patient in the office this morning.  Patient will be discharged from the emergency department to go to his office to be seen for further evaluation and treatment.  Final Clinical Impressions(s) / ED Diagnoses   Final diagnoses:  Wrist pain, acute, right    ED Discharge Orders    None       , Gwenyth Allegra, MD 09/25/19 [phone]

## 2019-09-26 DIAGNOSIS — M65831 Other synovitis and tenosynovitis, right forearm: Secondary | ICD-10-CM | POA: Diagnosis not present

## 2019-09-26 DIAGNOSIS — M79641 Pain in right hand: Secondary | ICD-10-CM | POA: Diagnosis not present

## 2019-09-28 LAB — BODY FLUID CULTURE: Culture: NO GROWTH

## 2019-11-14 DIAGNOSIS — D509 Iron deficiency anemia, unspecified: Secondary | ICD-10-CM | POA: Diagnosis not present

## 2019-11-14 DIAGNOSIS — D649 Anemia, unspecified: Secondary | ICD-10-CM | POA: Diagnosis not present

## 2019-11-14 DIAGNOSIS — Z7901 Long term (current) use of anticoagulants: Secondary | ICD-10-CM | POA: Diagnosis not present

## 2019-11-14 DIAGNOSIS — G609 Hereditary and idiopathic neuropathy, unspecified: Secondary | ICD-10-CM | POA: Diagnosis not present

## 2019-11-14 DIAGNOSIS — I129 Hypertensive chronic kidney disease with stage 1 through stage 4 chronic kidney disease, or unspecified chronic kidney disease: Secondary | ICD-10-CM | POA: Diagnosis not present

## 2019-11-14 DIAGNOSIS — K219 Gastro-esophageal reflux disease without esophagitis: Secondary | ICD-10-CM | POA: Diagnosis not present

## 2019-11-14 DIAGNOSIS — R748 Abnormal levels of other serum enzymes: Secondary | ICD-10-CM | POA: Diagnosis not present

## 2019-11-14 DIAGNOSIS — I4891 Unspecified atrial fibrillation: Secondary | ICD-10-CM | POA: Diagnosis not present

## 2019-11-14 DIAGNOSIS — N1831 Chronic kidney disease, stage 3a: Secondary | ICD-10-CM | POA: Diagnosis not present

## 2020-02-07 DIAGNOSIS — Z23 Encounter for immunization: Secondary | ICD-10-CM | POA: Diagnosis not present

## 2020-03-06 DIAGNOSIS — Z23 Encounter for immunization: Secondary | ICD-10-CM | POA: Diagnosis not present

## 2020-03-17 DIAGNOSIS — N1831 Chronic kidney disease, stage 3a: Secondary | ICD-10-CM | POA: Diagnosis not present

## 2020-03-17 DIAGNOSIS — I4891 Unspecified atrial fibrillation: Secondary | ICD-10-CM | POA: Diagnosis not present

## 2020-03-17 DIAGNOSIS — R748 Abnormal levels of other serum enzymes: Secondary | ICD-10-CM | POA: Diagnosis not present

## 2020-03-17 DIAGNOSIS — G609 Hereditary and idiopathic neuropathy, unspecified: Secondary | ICD-10-CM | POA: Diagnosis not present

## 2020-03-17 DIAGNOSIS — R7301 Impaired fasting glucose: Secondary | ICD-10-CM | POA: Diagnosis not present

## 2020-03-17 DIAGNOSIS — I10 Essential (primary) hypertension: Secondary | ICD-10-CM | POA: Diagnosis not present

## 2020-03-17 DIAGNOSIS — Z7901 Long term (current) use of anticoagulants: Secondary | ICD-10-CM | POA: Diagnosis not present

## 2020-03-17 DIAGNOSIS — I129 Hypertensive chronic kidney disease with stage 1 through stage 4 chronic kidney disease, or unspecified chronic kidney disease: Secondary | ICD-10-CM | POA: Diagnosis not present

## 2020-03-17 DIAGNOSIS — D649 Anemia, unspecified: Secondary | ICD-10-CM | POA: Diagnosis not present

## 2020-03-17 DIAGNOSIS — H8110 Benign paroxysmal vertigo, unspecified ear: Secondary | ICD-10-CM | POA: Diagnosis not present

## 2020-03-17 DIAGNOSIS — I749 Embolism and thrombosis of unspecified artery: Secondary | ICD-10-CM | POA: Diagnosis not present

## 2020-03-17 DIAGNOSIS — D6869 Other thrombophilia: Secondary | ICD-10-CM | POA: Diagnosis not present

## 2020-05-07 DIAGNOSIS — C44319 Basal cell carcinoma of skin of other parts of face: Secondary | ICD-10-CM | POA: Diagnosis not present

## 2020-05-12 DIAGNOSIS — C44319 Basal cell carcinoma of skin of other parts of face: Secondary | ICD-10-CM | POA: Diagnosis not present

## 2020-05-29 DIAGNOSIS — L249 Irritant contact dermatitis, unspecified cause: Secondary | ICD-10-CM | POA: Diagnosis not present

## 2020-06-08 DIAGNOSIS — C44319 Basal cell carcinoma of skin of other parts of face: Secondary | ICD-10-CM | POA: Diagnosis not present

## 2020-09-08 DIAGNOSIS — L570 Actinic keratosis: Secondary | ICD-10-CM | POA: Diagnosis not present

## 2020-09-08 DIAGNOSIS — L819 Disorder of pigmentation, unspecified: Secondary | ICD-10-CM | POA: Diagnosis not present

## 2020-09-16 DIAGNOSIS — M859 Disorder of bone density and structure, unspecified: Secondary | ICD-10-CM | POA: Diagnosis not present

## 2020-09-16 DIAGNOSIS — I10 Essential (primary) hypertension: Secondary | ICD-10-CM | POA: Diagnosis not present

## 2020-09-16 DIAGNOSIS — R7301 Impaired fasting glucose: Secondary | ICD-10-CM | POA: Diagnosis not present

## 2020-09-16 DIAGNOSIS — Z125 Encounter for screening for malignant neoplasm of prostate: Secondary | ICD-10-CM | POA: Diagnosis not present

## 2020-09-16 DIAGNOSIS — E785 Hyperlipidemia, unspecified: Secondary | ICD-10-CM | POA: Diagnosis not present

## 2020-09-16 DIAGNOSIS — M109 Gout, unspecified: Secondary | ICD-10-CM | POA: Diagnosis not present

## 2020-09-23 DIAGNOSIS — I4891 Unspecified atrial fibrillation: Secondary | ICD-10-CM | POA: Diagnosis not present

## 2020-09-23 DIAGNOSIS — I129 Hypertensive chronic kidney disease with stage 1 through stage 4 chronic kidney disease, or unspecified chronic kidney disease: Secondary | ICD-10-CM | POA: Diagnosis not present

## 2020-09-23 DIAGNOSIS — Z0000 Encounter for general adult medical examination without abnormal findings: Secondary | ICD-10-CM | POA: Diagnosis not present

## 2020-09-23 DIAGNOSIS — M859 Disorder of bone density and structure, unspecified: Secondary | ICD-10-CM | POA: Diagnosis not present

## 2020-09-23 DIAGNOSIS — D6869 Other thrombophilia: Secondary | ICD-10-CM | POA: Diagnosis not present

## 2020-09-23 DIAGNOSIS — R82998 Other abnormal findings in urine: Secondary | ICD-10-CM | POA: Diagnosis not present

## 2020-09-23 DIAGNOSIS — I440 Atrioventricular block, first degree: Secondary | ICD-10-CM | POA: Diagnosis not present

## 2020-09-23 DIAGNOSIS — Z23 Encounter for immunization: Secondary | ICD-10-CM | POA: Diagnosis not present

## 2020-09-23 DIAGNOSIS — D509 Iron deficiency anemia, unspecified: Secondary | ICD-10-CM | POA: Diagnosis not present

## 2020-09-23 DIAGNOSIS — N1831 Chronic kidney disease, stage 3a: Secondary | ICD-10-CM | POA: Diagnosis not present

## 2020-09-23 DIAGNOSIS — R809 Proteinuria, unspecified: Secondary | ICD-10-CM | POA: Diagnosis not present

## 2020-09-23 DIAGNOSIS — H8110 Benign paroxysmal vertigo, unspecified ear: Secondary | ICD-10-CM | POA: Diagnosis not present

## 2020-09-23 DIAGNOSIS — E785 Hyperlipidemia, unspecified: Secondary | ICD-10-CM | POA: Diagnosis not present

## 2020-10-21 DIAGNOSIS — N1831 Chronic kidney disease, stage 3a: Secondary | ICD-10-CM | POA: Diagnosis not present

## 2020-10-21 DIAGNOSIS — R42 Dizziness and giddiness: Secondary | ICD-10-CM | POA: Diagnosis not present

## 2020-10-21 DIAGNOSIS — I129 Hypertensive chronic kidney disease with stage 1 through stage 4 chronic kidney disease, or unspecified chronic kidney disease: Secondary | ICD-10-CM | POA: Diagnosis not present

## 2020-10-21 DIAGNOSIS — I4891 Unspecified atrial fibrillation: Secondary | ICD-10-CM | POA: Diagnosis not present

## 2020-11-23 DIAGNOSIS — Z23 Encounter for immunization: Secondary | ICD-10-CM | POA: Diagnosis not present

## 2020-12-08 DIAGNOSIS — C4441 Basal cell carcinoma of skin of scalp and neck: Secondary | ICD-10-CM | POA: Diagnosis not present

## 2021-01-10 IMAGING — CR DG WRIST COMPLETE 3+V*R*
4 series · 4 of 4 positions shown · non-contrast
Comparison: 06/18/2017

CLINICAL DATA: Wrist pain and swelling for 2 weeks

EXAM:
RIGHT WRIST - COMPLETE 3+ VIEW

[x wrist pa right]
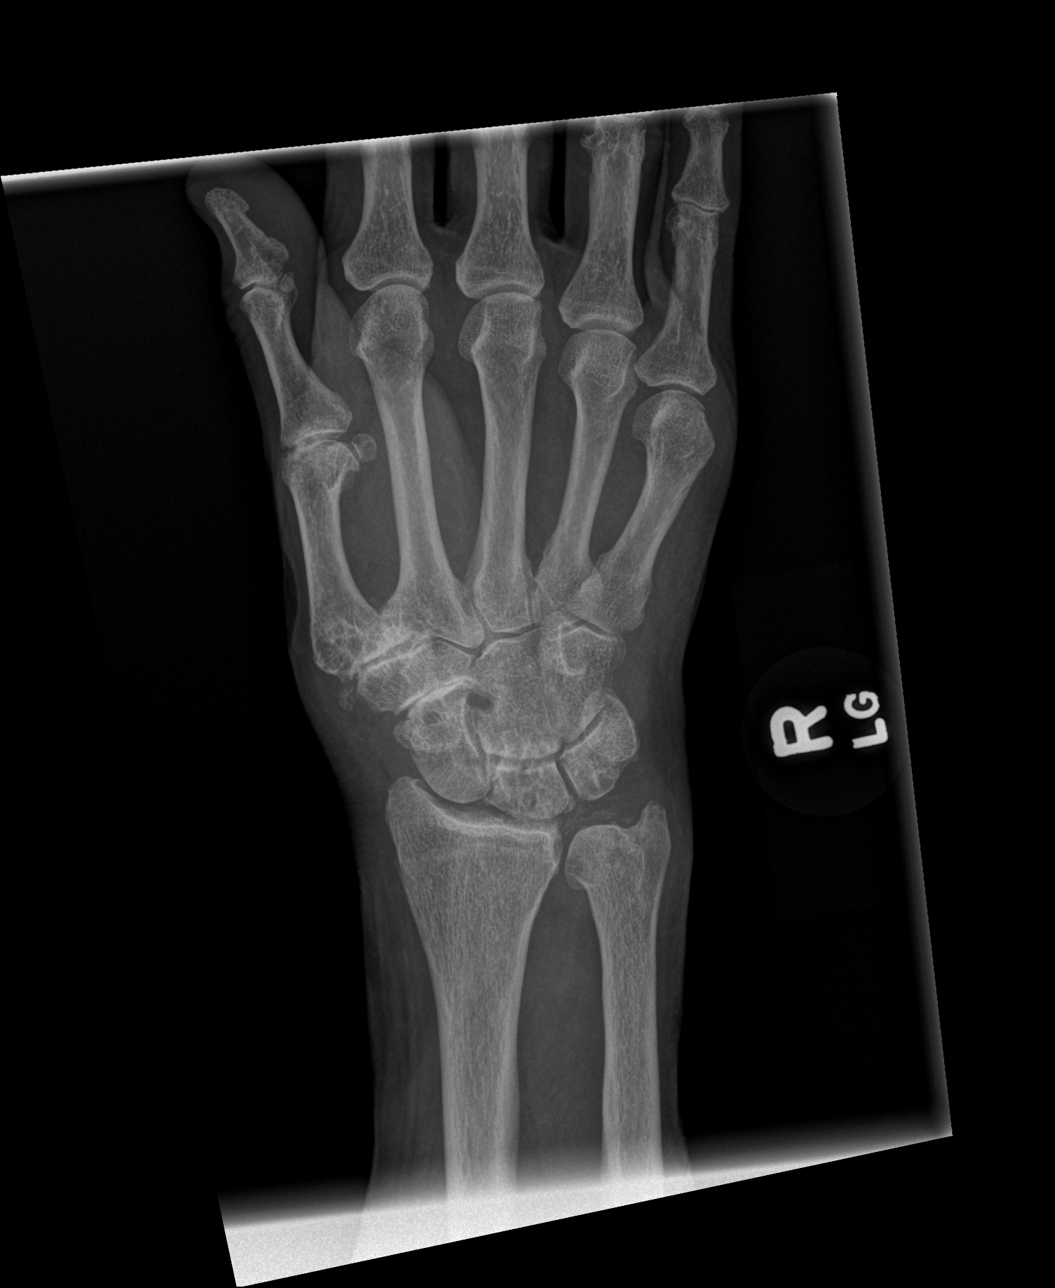

[x wrist obl right]
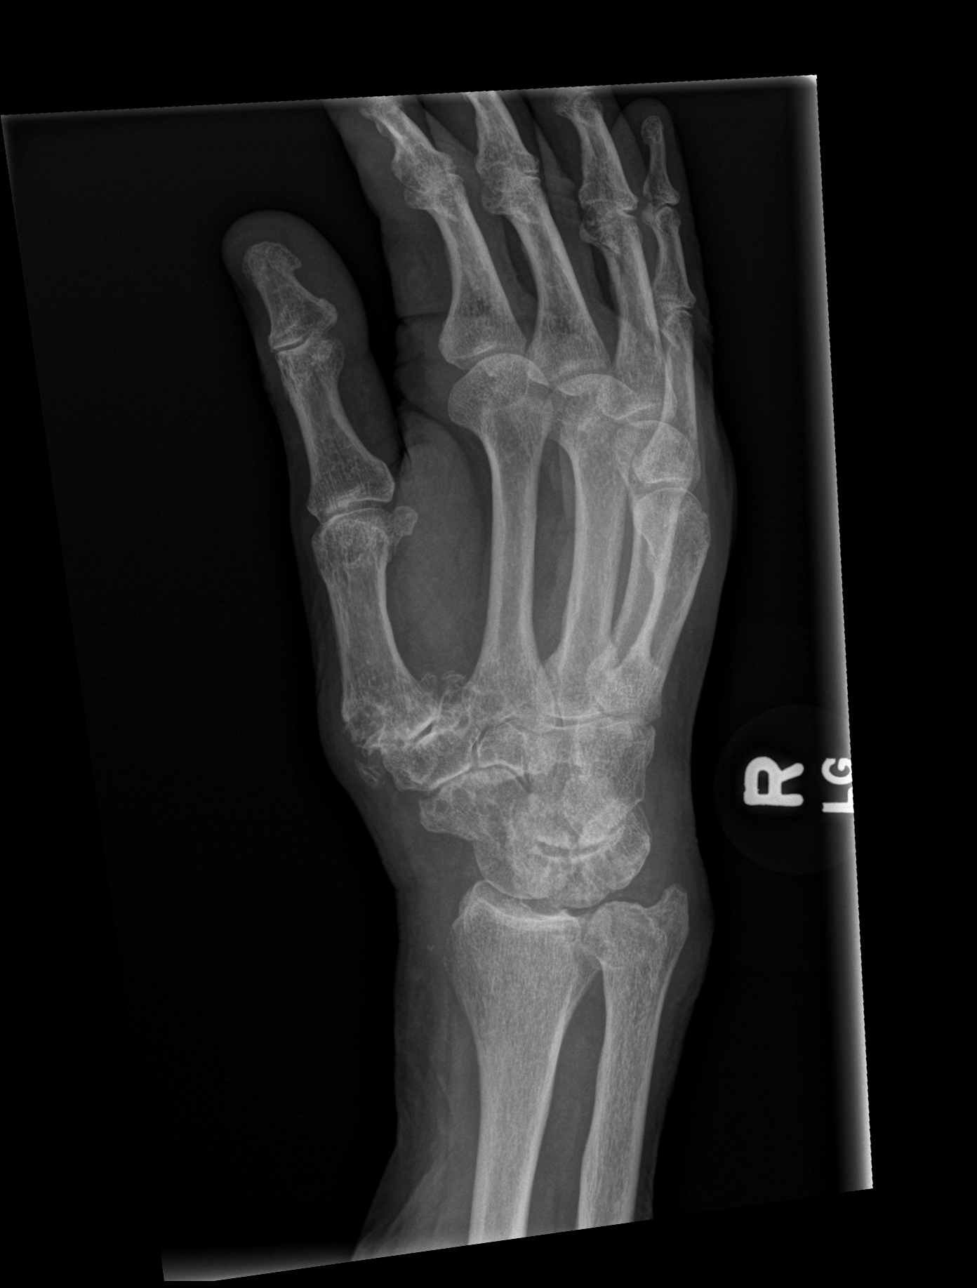

[x wrist lat right]
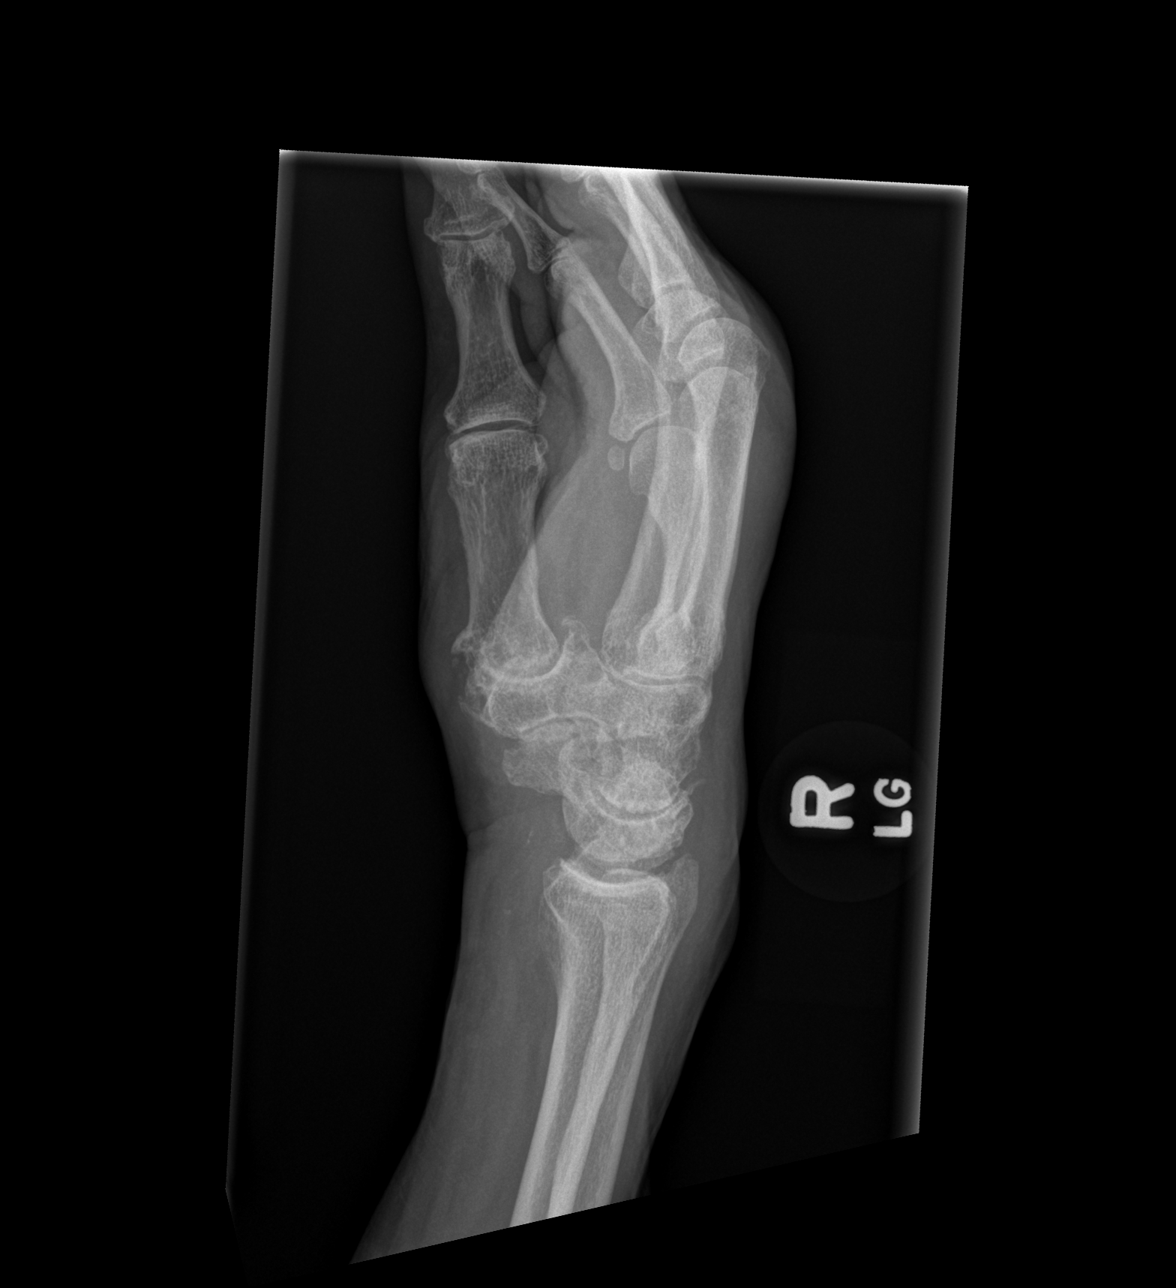

[x wrist navicular view right]
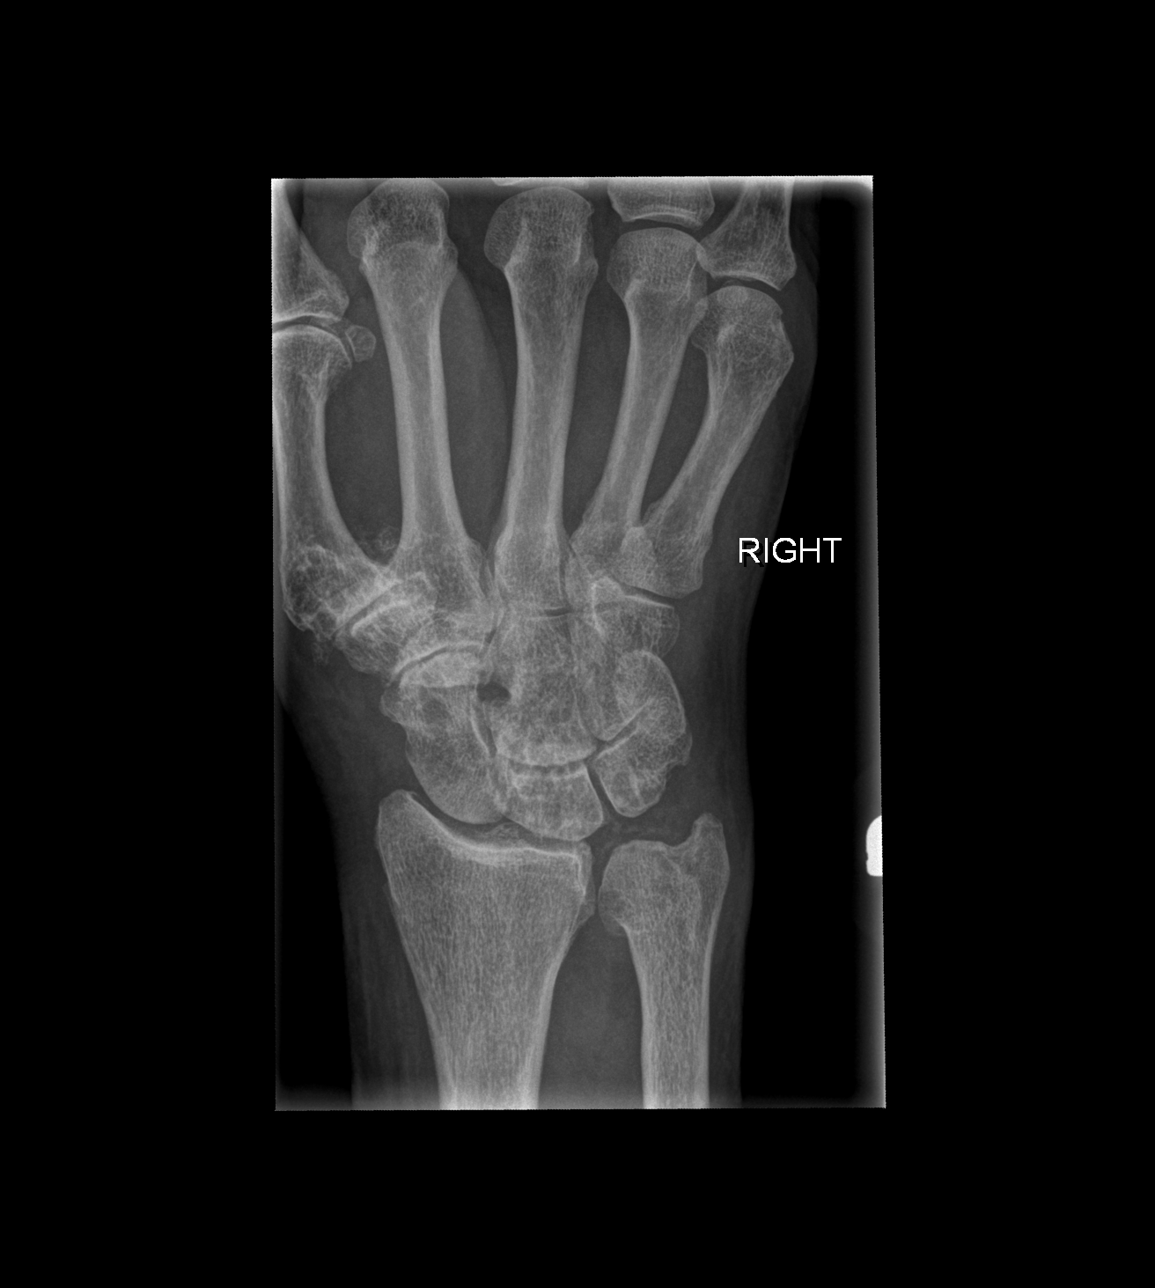

[4 of 4 positions shown; findings below may reference images not displayed]

FINDINGS: There is no evidence of acute fracture or dislocation. Probable
remote fifth metacarpal neck fracture. Chondrocalcinosis without
erosion. Degenerative joint narrowing and spurring with subchondral
cystic change at the wrist, STT joint, and first CMC joint.
Generalized osteopenia
IMPRESSION: 1. No acute finding.
2. Osteoarthritis as described.

## 2021-01-18 DIAGNOSIS — C44319 Basal cell carcinoma of skin of other parts of face: Secondary | ICD-10-CM | POA: Diagnosis not present

## 2021-02-09 DIAGNOSIS — I129 Hypertensive chronic kidney disease with stage 1 through stage 4 chronic kidney disease, or unspecified chronic kidney disease: Secondary | ICD-10-CM | POA: Diagnosis not present

## 2021-02-09 DIAGNOSIS — R42 Dizziness and giddiness: Secondary | ICD-10-CM | POA: Diagnosis not present

## 2021-02-09 DIAGNOSIS — R7301 Impaired fasting glucose: Secondary | ICD-10-CM | POA: Diagnosis not present

## 2021-02-09 DIAGNOSIS — I749 Embolism and thrombosis of unspecified artery: Secondary | ICD-10-CM | POA: Diagnosis not present

## 2021-02-09 DIAGNOSIS — R269 Unspecified abnormalities of gait and mobility: Secondary | ICD-10-CM | POA: Diagnosis not present

## 2021-02-09 DIAGNOSIS — E785 Hyperlipidemia, unspecified: Secondary | ICD-10-CM | POA: Diagnosis not present

## 2021-02-09 DIAGNOSIS — D509 Iron deficiency anemia, unspecified: Secondary | ICD-10-CM | POA: Diagnosis not present

## 2021-02-09 DIAGNOSIS — N1831 Chronic kidney disease, stage 3a: Secondary | ICD-10-CM | POA: Diagnosis not present

## 2021-02-09 DIAGNOSIS — I4891 Unspecified atrial fibrillation: Secondary | ICD-10-CM | POA: Diagnosis not present

## 2021-02-09 DIAGNOSIS — M8580 Other specified disorders of bone density and structure, unspecified site: Secondary | ICD-10-CM | POA: Diagnosis not present

## 2021-02-09 DIAGNOSIS — D6869 Other thrombophilia: Secondary | ICD-10-CM | POA: Diagnosis not present

## 2021-02-09 DIAGNOSIS — G609 Hereditary and idiopathic neuropathy, unspecified: Secondary | ICD-10-CM | POA: Diagnosis not present

## 2021-03-30 DIAGNOSIS — L570 Actinic keratosis: Secondary | ICD-10-CM | POA: Diagnosis not present

## 2021-03-30 DIAGNOSIS — Z85828 Personal history of other malignant neoplasm of skin: Secondary | ICD-10-CM | POA: Diagnosis not present

## 2021-03-30 DIAGNOSIS — L905 Scar conditions and fibrosis of skin: Secondary | ICD-10-CM | POA: Diagnosis not present

## 2021-07-07 DIAGNOSIS — C44329 Squamous cell carcinoma of skin of other parts of face: Secondary | ICD-10-CM | POA: Diagnosis not present

## 2021-07-07 DIAGNOSIS — D485 Neoplasm of uncertain behavior of skin: Secondary | ICD-10-CM | POA: Diagnosis not present

## 2021-07-07 DIAGNOSIS — Z85828 Personal history of other malignant neoplasm of skin: Secondary | ICD-10-CM | POA: Diagnosis not present

## 2021-07-07 DIAGNOSIS — L905 Scar conditions and fibrosis of skin: Secondary | ICD-10-CM | POA: Diagnosis not present

## 2021-10-06 DIAGNOSIS — D492 Neoplasm of unspecified behavior of bone, soft tissue, and skin: Secondary | ICD-10-CM | POA: Diagnosis not present

## 2021-10-06 DIAGNOSIS — L819 Disorder of pigmentation, unspecified: Secondary | ICD-10-CM | POA: Diagnosis not present

## 2021-10-06 DIAGNOSIS — Z85828 Personal history of other malignant neoplasm of skin: Secondary | ICD-10-CM | POA: Diagnosis not present

## 2021-10-06 DIAGNOSIS — C44329 Squamous cell carcinoma of skin of other parts of face: Secondary | ICD-10-CM | POA: Diagnosis not present

## 2021-10-06 DIAGNOSIS — Z08 Encounter for follow-up examination after completed treatment for malignant neoplasm: Secondary | ICD-10-CM | POA: Diagnosis not present

## 2021-10-06 DIAGNOSIS — L570 Actinic keratosis: Secondary | ICD-10-CM | POA: Diagnosis not present

## 2021-10-06 DIAGNOSIS — C44211 Basal cell carcinoma of skin of unspecified ear and external auricular canal: Secondary | ICD-10-CM | POA: Diagnosis not present

## 2021-10-20 DIAGNOSIS — Z125 Encounter for screening for malignant neoplasm of prostate: Secondary | ICD-10-CM | POA: Diagnosis not present

## 2021-10-20 DIAGNOSIS — E785 Hyperlipidemia, unspecified: Secondary | ICD-10-CM | POA: Diagnosis not present

## 2021-10-20 DIAGNOSIS — R7301 Impaired fasting glucose: Secondary | ICD-10-CM | POA: Diagnosis not present

## 2021-10-20 DIAGNOSIS — M859 Disorder of bone density and structure, unspecified: Secondary | ICD-10-CM | POA: Diagnosis not present

## 2021-10-20 DIAGNOSIS — M109 Gout, unspecified: Secondary | ICD-10-CM | POA: Diagnosis not present

## 2021-10-27 DIAGNOSIS — E785 Hyperlipidemia, unspecified: Secondary | ICD-10-CM | POA: Diagnosis not present

## 2021-10-27 DIAGNOSIS — G609 Hereditary and idiopathic neuropathy, unspecified: Secondary | ICD-10-CM | POA: Diagnosis not present

## 2021-10-27 DIAGNOSIS — R748 Abnormal levels of other serum enzymes: Secondary | ICD-10-CM | POA: Diagnosis not present

## 2021-10-27 DIAGNOSIS — R011 Cardiac murmur, unspecified: Secondary | ICD-10-CM | POA: Diagnosis not present

## 2021-10-27 DIAGNOSIS — Z23 Encounter for immunization: Secondary | ICD-10-CM | POA: Diagnosis not present

## 2021-10-27 DIAGNOSIS — D6869 Other thrombophilia: Secondary | ICD-10-CM | POA: Diagnosis not present

## 2021-10-27 DIAGNOSIS — R82998 Other abnormal findings in urine: Secondary | ICD-10-CM | POA: Diagnosis not present

## 2021-10-27 DIAGNOSIS — Z0000 Encounter for general adult medical examination without abnormal findings: Secondary | ICD-10-CM | POA: Diagnosis not present

## 2021-10-27 DIAGNOSIS — M1990 Unspecified osteoarthritis, unspecified site: Secondary | ICD-10-CM | POA: Diagnosis not present

## 2021-10-27 DIAGNOSIS — N1831 Chronic kidney disease, stage 3a: Secondary | ICD-10-CM | POA: Diagnosis not present

## 2021-10-27 DIAGNOSIS — Z7901 Long term (current) use of anticoagulants: Secondary | ICD-10-CM | POA: Diagnosis not present

## 2021-10-27 DIAGNOSIS — I4891 Unspecified atrial fibrillation: Secondary | ICD-10-CM | POA: Diagnosis not present

## 2021-10-27 DIAGNOSIS — I749 Embolism and thrombosis of unspecified artery: Secondary | ICD-10-CM | POA: Diagnosis not present

## 2021-10-27 DIAGNOSIS — I129 Hypertensive chronic kidney disease with stage 1 through stage 4 chronic kidney disease, or unspecified chronic kidney disease: Secondary | ICD-10-CM | POA: Diagnosis not present

## 2021-11-26 DIAGNOSIS — M8589 Other specified disorders of bone density and structure, multiple sites: Secondary | ICD-10-CM | POA: Diagnosis not present

## 2021-12-15 DIAGNOSIS — C44212 Basal cell carcinoma of skin of right ear and external auricular canal: Secondary | ICD-10-CM | POA: Diagnosis not present

## 2021-12-15 DIAGNOSIS — Z481 Encounter for planned postprocedural wound closure: Secondary | ICD-10-CM | POA: Diagnosis not present

## 2021-12-15 DIAGNOSIS — C4491 Basal cell carcinoma of skin, unspecified: Secondary | ICD-10-CM | POA: Diagnosis not present

## 2022-01-10 DIAGNOSIS — L819 Disorder of pigmentation, unspecified: Secondary | ICD-10-CM | POA: Diagnosis not present

## 2022-01-10 DIAGNOSIS — Z08 Encounter for follow-up examination after completed treatment for malignant neoplasm: Secondary | ICD-10-CM | POA: Diagnosis not present

## 2022-01-10 DIAGNOSIS — L570 Actinic keratosis: Secondary | ICD-10-CM | POA: Diagnosis not present

## 2022-01-10 DIAGNOSIS — C44329 Squamous cell carcinoma of skin of other parts of face: Secondary | ICD-10-CM | POA: Diagnosis not present

## 2022-01-10 DIAGNOSIS — Z85828 Personal history of other malignant neoplasm of skin: Secondary | ICD-10-CM | POA: Diagnosis not present

## 2022-04-11 DIAGNOSIS — Z85828 Personal history of other malignant neoplasm of skin: Secondary | ICD-10-CM | POA: Diagnosis not present

## 2022-04-11 DIAGNOSIS — R233 Spontaneous ecchymoses: Secondary | ICD-10-CM | POA: Diagnosis not present

## 2022-04-11 DIAGNOSIS — L821 Other seborrheic keratosis: Secondary | ICD-10-CM | POA: Diagnosis not present

## 2022-04-11 DIAGNOSIS — Z08 Encounter for follow-up examination after completed treatment for malignant neoplasm: Secondary | ICD-10-CM | POA: Diagnosis not present

## 2022-04-11 DIAGNOSIS — L570 Actinic keratosis: Secondary | ICD-10-CM | POA: Diagnosis not present

## 2022-04-11 DIAGNOSIS — L853 Xerosis cutis: Secondary | ICD-10-CM | POA: Diagnosis not present

## 2022-04-11 DIAGNOSIS — L814 Other melanin hyperpigmentation: Secondary | ICD-10-CM | POA: Diagnosis not present

## 2022-04-11 DIAGNOSIS — D225 Melanocytic nevi of trunk: Secondary | ICD-10-CM | POA: Diagnosis not present

## 2022-04-11 DIAGNOSIS — C44329 Squamous cell carcinoma of skin of other parts of face: Secondary | ICD-10-CM | POA: Diagnosis not present

## 2022-04-29 DIAGNOSIS — R7301 Impaired fasting glucose: Secondary | ICD-10-CM | POA: Diagnosis not present

## 2022-04-29 DIAGNOSIS — I129 Hypertensive chronic kidney disease with stage 1 through stage 4 chronic kidney disease, or unspecified chronic kidney disease: Secondary | ICD-10-CM | POA: Diagnosis not present

## 2022-04-29 DIAGNOSIS — I4891 Unspecified atrial fibrillation: Secondary | ICD-10-CM | POA: Diagnosis not present

## 2022-04-29 DIAGNOSIS — G609 Hereditary and idiopathic neuropathy, unspecified: Secondary | ICD-10-CM | POA: Diagnosis not present

## 2022-04-29 DIAGNOSIS — I749 Embolism and thrombosis of unspecified artery: Secondary | ICD-10-CM | POA: Diagnosis not present

## 2022-04-29 DIAGNOSIS — Z7901 Long term (current) use of anticoagulants: Secondary | ICD-10-CM | POA: Diagnosis not present

## 2022-04-29 DIAGNOSIS — I440 Atrioventricular block, first degree: Secondary | ICD-10-CM | POA: Diagnosis not present

## 2022-04-29 DIAGNOSIS — R748 Abnormal levels of other serum enzymes: Secondary | ICD-10-CM | POA: Diagnosis not present

## 2022-04-29 DIAGNOSIS — M1990 Unspecified osteoarthritis, unspecified site: Secondary | ICD-10-CM | POA: Diagnosis not present

## 2022-04-29 DIAGNOSIS — N1831 Chronic kidney disease, stage 3a: Secondary | ICD-10-CM | POA: Diagnosis not present

## 2022-04-29 DIAGNOSIS — M8580 Other specified disorders of bone density and structure, unspecified site: Secondary | ICD-10-CM | POA: Diagnosis not present

## 2022-04-29 DIAGNOSIS — D6869 Other thrombophilia: Secondary | ICD-10-CM | POA: Diagnosis not present

## 2022-05-03 ENCOUNTER — Other Ambulatory Visit: Payer: Self-pay | Admitting: Internal Medicine

## 2022-05-03 DIAGNOSIS — R17 Unspecified jaundice: Secondary | ICD-10-CM

## 2022-08-01 ENCOUNTER — Inpatient Hospital Stay (HOSPITAL_COMMUNITY)
Admission: EM | Admit: 2022-08-01 | Discharge: 2022-08-26 | DRG: 329 | Disposition: E | Payer: Medicare Other | Attending: Vascular Surgery | Admitting: Vascular Surgery

## 2022-08-01 ENCOUNTER — Encounter (HOSPITAL_COMMUNITY): Payer: Self-pay | Admitting: Emergency Medicine

## 2022-08-01 ENCOUNTER — Emergency Department (HOSPITAL_COMMUNITY): Payer: Medicare Other

## 2022-08-01 ENCOUNTER — Encounter (HOSPITAL_COMMUNITY): Admission: EM | Disposition: E | Payer: Self-pay | Source: Home / Self Care | Attending: Vascular Surgery

## 2022-08-01 DIAGNOSIS — K219 Gastro-esophageal reflux disease without esophagitis: Secondary | ICD-10-CM | POA: Diagnosis present

## 2022-08-01 DIAGNOSIS — E440 Moderate protein-calorie malnutrition: Secondary | ICD-10-CM | POA: Diagnosis not present

## 2022-08-01 DIAGNOSIS — T8110XA Postprocedural shock unspecified, initial encounter: Secondary | ICD-10-CM | POA: Diagnosis not present

## 2022-08-01 DIAGNOSIS — R2681 Unsteadiness on feet: Secondary | ICD-10-CM | POA: Diagnosis present

## 2022-08-01 DIAGNOSIS — K55019 Acute (reversible) ischemia of small intestine, extent unspecified: Principal | ICD-10-CM | POA: Diagnosis present

## 2022-08-01 DIAGNOSIS — D72829 Elevated white blood cell count, unspecified: Secondary | ICD-10-CM | POA: Diagnosis present

## 2022-08-01 DIAGNOSIS — Z4682 Encounter for fitting and adjustment of non-vascular catheter: Secondary | ICD-10-CM | POA: Diagnosis not present

## 2022-08-01 DIAGNOSIS — Z7901 Long term (current) use of anticoagulants: Secondary | ICD-10-CM

## 2022-08-01 DIAGNOSIS — I700 Atherosclerosis of aorta: Secondary | ICD-10-CM | POA: Diagnosis not present

## 2022-08-01 DIAGNOSIS — I340 Nonrheumatic mitral (valve) insufficiency: Secondary | ICD-10-CM | POA: Diagnosis present

## 2022-08-01 DIAGNOSIS — K5730 Diverticulosis of large intestine without perforation or abscess without bleeding: Secondary | ICD-10-CM | POA: Diagnosis not present

## 2022-08-01 DIAGNOSIS — I2510 Atherosclerotic heart disease of native coronary artery without angina pectoris: Secondary | ICD-10-CM | POA: Diagnosis not present

## 2022-08-01 DIAGNOSIS — Z01818 Encounter for other preprocedural examination: Secondary | ICD-10-CM | POA: Diagnosis not present

## 2022-08-01 DIAGNOSIS — Z66 Do not resuscitate: Secondary | ICD-10-CM | POA: Diagnosis not present

## 2022-08-01 DIAGNOSIS — Z452 Encounter for adjustment and management of vascular access device: Secondary | ICD-10-CM | POA: Diagnosis not present

## 2022-08-01 DIAGNOSIS — R9431 Abnormal electrocardiogram [ECG] [EKG]: Secondary | ICD-10-CM | POA: Diagnosis not present

## 2022-08-01 DIAGNOSIS — I70202 Unspecified atherosclerosis of native arteries of extremities, left leg: Secondary | ICD-10-CM | POA: Diagnosis present

## 2022-08-01 DIAGNOSIS — Q430 Meckel's diverticulum (displaced) (hypertrophic): Secondary | ICD-10-CM | POA: Diagnosis not present

## 2022-08-01 DIAGNOSIS — L8990 Pressure ulcer of unspecified site, unspecified stage: Secondary | ICD-10-CM | POA: Insufficient documentation

## 2022-08-01 DIAGNOSIS — L89152 Pressure ulcer of sacral region, stage 2: Secondary | ICD-10-CM | POA: Diagnosis present

## 2022-08-01 DIAGNOSIS — I272 Pulmonary hypertension, unspecified: Secondary | ICD-10-CM | POA: Diagnosis present

## 2022-08-01 DIAGNOSIS — I482 Chronic atrial fibrillation, unspecified: Secondary | ICD-10-CM | POA: Diagnosis present

## 2022-08-01 DIAGNOSIS — K55059 Acute (reversible) ischemia of intestine, part and extent unspecified: Principal | ICD-10-CM

## 2022-08-01 DIAGNOSIS — K551 Chronic vascular disorders of intestine: Secondary | ICD-10-CM | POA: Diagnosis present

## 2022-08-01 DIAGNOSIS — K559 Vascular disorder of intestine, unspecified: Secondary | ICD-10-CM | POA: Diagnosis not present

## 2022-08-01 DIAGNOSIS — Z515 Encounter for palliative care: Secondary | ICD-10-CM

## 2022-08-01 DIAGNOSIS — J9600 Acute respiratory failure, unspecified whether with hypoxia or hypercapnia: Secondary | ICD-10-CM | POA: Diagnosis not present

## 2022-08-01 DIAGNOSIS — Z86718 Personal history of other venous thrombosis and embolism: Secondary | ICD-10-CM

## 2022-08-01 DIAGNOSIS — D696 Thrombocytopenia, unspecified: Secondary | ICD-10-CM | POA: Diagnosis present

## 2022-08-01 DIAGNOSIS — N179 Acute kidney failure, unspecified: Secondary | ICD-10-CM | POA: Diagnosis present

## 2022-08-01 DIAGNOSIS — R579 Shock, unspecified: Secondary | ICD-10-CM | POA: Diagnosis not present

## 2022-08-01 DIAGNOSIS — R918 Other nonspecific abnormal finding of lung field: Secondary | ICD-10-CM | POA: Diagnosis not present

## 2022-08-01 DIAGNOSIS — K658 Other peritonitis: Secondary | ICD-10-CM | POA: Diagnosis not present

## 2022-08-01 DIAGNOSIS — E872 Acidosis, unspecified: Secondary | ICD-10-CM | POA: Diagnosis present

## 2022-08-01 DIAGNOSIS — I517 Cardiomegaly: Secondary | ICD-10-CM | POA: Diagnosis not present

## 2022-08-01 DIAGNOSIS — I10 Essential (primary) hypertension: Secondary | ICD-10-CM | POA: Diagnosis not present

## 2022-08-01 DIAGNOSIS — R109 Unspecified abdominal pain: Secondary | ICD-10-CM | POA: Diagnosis not present

## 2022-08-01 DIAGNOSIS — Z9911 Dependence on respirator [ventilator] status: Secondary | ICD-10-CM

## 2022-08-01 DIAGNOSIS — N281 Cyst of kidney, acquired: Secondary | ICD-10-CM | POA: Diagnosis not present

## 2022-08-01 DIAGNOSIS — K55029 Acute infarction of small intestine, extent unspecified: Secondary | ICD-10-CM | POA: Diagnosis not present

## 2022-08-01 DIAGNOSIS — Z7189 Other specified counseling: Secondary | ICD-10-CM | POA: Diagnosis not present

## 2022-08-01 DIAGNOSIS — K922 Gastrointestinal hemorrhage, unspecified: Secondary | ICD-10-CM | POA: Diagnosis not present

## 2022-08-01 DIAGNOSIS — K55021 Focal (segmental) acute infarction of small intestine: Secondary | ICD-10-CM | POA: Diagnosis not present

## 2022-08-01 DIAGNOSIS — K5712 Diverticulitis of small intestine without perforation or abscess without bleeding: Secondary | ICD-10-CM | POA: Diagnosis not present

## 2022-08-01 DIAGNOSIS — E876 Hypokalemia: Secondary | ICD-10-CM | POA: Diagnosis present

## 2022-08-01 DIAGNOSIS — I4891 Unspecified atrial fibrillation: Secondary | ICD-10-CM | POA: Diagnosis not present

## 2022-08-01 DIAGNOSIS — J9601 Acute respiratory failure with hypoxia: Secondary | ICD-10-CM | POA: Diagnosis not present

## 2022-08-01 DIAGNOSIS — J95821 Acute postprocedural respiratory failure: Secondary | ICD-10-CM | POA: Diagnosis not present

## 2022-08-01 DIAGNOSIS — S31109A Unspecified open wound of abdominal wall, unspecified quadrant without penetration into peritoneal cavity, initial encounter: Secondary | ICD-10-CM | POA: Diagnosis not present

## 2022-08-01 DIAGNOSIS — I080 Rheumatic disorders of both mitral and aortic valves: Secondary | ICD-10-CM | POA: Diagnosis not present

## 2022-08-01 DIAGNOSIS — Z79899 Other long term (current) drug therapy: Secondary | ICD-10-CM

## 2022-08-01 DIAGNOSIS — R451 Restlessness and agitation: Secondary | ICD-10-CM | POA: Diagnosis not present

## 2022-08-01 DIAGNOSIS — R079 Chest pain, unspecified: Secondary | ICD-10-CM | POA: Diagnosis not present

## 2022-08-01 DIAGNOSIS — E875 Hyperkalemia: Secondary | ICD-10-CM | POA: Diagnosis present

## 2022-08-01 DIAGNOSIS — Z6821 Body mass index (BMI) 21.0-21.9, adult: Secondary | ICD-10-CM

## 2022-08-01 DIAGNOSIS — I739 Peripheral vascular disease, unspecified: Secondary | ICD-10-CM | POA: Diagnosis not present

## 2022-08-01 HISTORY — PX: PERIPHERAL VASCULAR THROMBECTOMY: CATH118306

## 2022-08-01 HISTORY — PX: VISCERAL ANGIOGRAPHY: CATH118276

## 2022-08-01 LAB — HEPARIN LEVEL (UNFRACTIONATED): Heparin Unfractionated: <0.1 IU/mL — ABNORMAL LOW (ref 0.30–0.70)

## 2022-08-01 LAB — URINALYSIS, ROUTINE W REFLEX MICROSCOPIC
Bacteria, UA: NONE SEEN
Bilirubin Urine: NEGATIVE
Glucose, UA: NEGATIVE mg/dL
Ketones, ur: 5 mg/dL — AB
Leukocytes,Ua: NEGATIVE
Nitrite: NEGATIVE
Protein, ur: 30 mg/dL — AB
Specific Gravity, Urine: >1.046 — ABNORMAL HIGH (ref 1.005–1.030)
pH: 5 (ref 5.0–8.0)

## 2022-08-01 LAB — LIPASE, BLOOD: Lipase: 39 U/L (ref 11–51)

## 2022-08-01 LAB — LACTIC ACID, PLASMA
Lactic Acid, Venous: 5.6 mmol/L (ref 0.5–1.9)
Lactic Acid, Venous: 6.9 mmol/L (ref 0.5–1.9)
Lactic Acid, Venous: 5.4 mmol/L (ref 0.5–1.9)

## 2022-08-01 LAB — CBC
HCT: 44.1 % (ref 39.0–52.0)
Hemoglobin: 14.6 g/dL (ref 13.0–17.0)
MCH: 29 pg (ref 26.0–34.0)
MCHC: 33.1 g/dL (ref 30.0–36.0)
MCV: 87.5 fL (ref 80.0–100.0)
Platelets: 105 10*3/uL — ABNORMAL LOW (ref 150–400)
RBC: 5.04 MIL/uL (ref 4.22–5.81)
RDW: 13 % (ref 11.5–15.5)
WBC: 20.9 10*3/uL — ABNORMAL HIGH (ref 4.0–10.5)
nRBC: 0 % (ref 0.0–0.2)

## 2022-08-01 LAB — CBC WITH DIFFERENTIAL/PLATELET
Abs Immature Granulocytes: 0.11 10*3/uL — ABNORMAL HIGH (ref 0.00–0.07)
Basophils Absolute: 0 10*3/uL (ref 0.0–0.1)
Basophils Relative: 0 %
Eosinophils Absolute: 0 10*3/uL (ref 0.0–0.5)
Eosinophils Relative: 0 %
HCT: 47.7 % (ref 39.0–52.0)
Hemoglobin: 16.1 g/dL (ref 13.0–17.0)
Immature Granulocytes: 2 %
Lymphocytes Relative: 38 %
Lymphs Abs: 2.6 10*3/uL (ref 0.7–4.0)
MCH: 29.2 pg (ref 26.0–34.0)
MCHC: 33.8 g/dL (ref 30.0–36.0)
MCV: 86.4 fL (ref 80.0–100.0)
Monocytes Absolute: 0.6 10*3/uL (ref 0.1–1.0)
Monocytes Relative: 9 %
Neutro Abs: 3.5 10*3/uL (ref 1.7–7.7)
Neutrophils Relative %: 51 %
Platelets: 136 10*3/uL — ABNORMAL LOW (ref 150–400)
RBC: 5.52 MIL/uL (ref 4.22–5.81)
RDW: 12.8 % (ref 11.5–15.5)
WBC: 6.8 10*3/uL (ref 4.0–10.5)
nRBC: 0 % (ref 0.0–0.2)

## 2022-08-01 LAB — COMPREHENSIVE METABOLIC PANEL
ALT: 19 U/L (ref 0–44)
ALT: 18 U/L (ref 0–44)
AST: 23 U/L (ref 15–41)
AST: 50 U/L — ABNORMAL HIGH (ref 15–41)
Albumin: 4.3 g/dL (ref 3.5–5.0)
Albumin: 3.4 g/dL — ABNORMAL LOW (ref 3.5–5.0)
Alkaline Phosphatase: 77 U/L (ref 38–126)
Alkaline Phosphatase: 61 U/L (ref 38–126)
Anion gap: 15 (ref 5–15)
Anion gap: 11 (ref 5–15)
BUN: 11 mg/dL (ref 8–23)
BUN: 13 mg/dL (ref 8–23)
CO2: 19 mmol/L — ABNORMAL LOW (ref 22–32)
CO2: 20 mmol/L — ABNORMAL LOW (ref 22–32)
Calcium: 9.3 mg/dL (ref 8.9–10.3)
Calcium: 8.3 mg/dL — ABNORMAL LOW (ref 8.9–10.3)
Chloride: 100 mmol/L (ref 98–111)
Chloride: 105 mmol/L (ref 98–111)
Creatinine, Ser: 1.32 mg/dL — ABNORMAL HIGH (ref 0.61–1.24)
Creatinine, Ser: 1.45 mg/dL — ABNORMAL HIGH (ref 0.61–1.24)
GFR, Estimated: 50 mL/min — ABNORMAL LOW (ref >60)
GFR, Estimated: 45 mL/min — ABNORMAL LOW (ref >60)
Glucose, Bld: 168 mg/dL — ABNORMAL HIGH (ref 70–99)
Glucose, Bld: 177 mg/dL — ABNORMAL HIGH (ref 70–99)
Potassium: 2.9 mmol/L — ABNORMAL LOW (ref 3.5–5.1)
Potassium: 5 mmol/L (ref 3.5–5.1)
Sodium: 134 mmol/L — ABNORMAL LOW (ref 135–145)
Sodium: 136 mmol/L (ref 135–145)
Total Bilirubin: 1.8 mg/dL — ABNORMAL HIGH (ref 0.3–1.2)
Total Bilirubin: 2 mg/dL — ABNORMAL HIGH (ref 0.3–1.2)
Total Protein: 8.1 g/dL (ref 6.5–8.1)
Total Protein: 6.3 g/dL — ABNORMAL LOW (ref 6.5–8.1)

## 2022-08-01 LAB — TYPE AND SCREEN
ABO/RH(D): O POS
Antibody Screen: NEGATIVE

## 2022-08-01 LAB — I-STAT CHEM 8, ED
BUN: 9 mg/dL (ref 8–23)
Calcium, Ion: 1.05 mmol/L — ABNORMAL LOW (ref 1.15–1.40)
Chloride: 100 mmol/L (ref 98–111)
Creatinine, Ser: 1.1 mg/dL (ref 0.61–1.24)
Glucose, Bld: 168 mg/dL — ABNORMAL HIGH (ref 70–99)
HCT: 46 % (ref 39.0–52.0)
Hemoglobin: 15.6 g/dL (ref 13.0–17.0)
Potassium: 3.2 mmol/L — ABNORMAL LOW (ref 3.5–5.1)
Sodium: 136 mmol/L (ref 135–145)
TCO2: 18 mmol/L — ABNORMAL LOW (ref 22–32)

## 2022-08-01 LAB — POCT ACTIVATED CLOTTING TIME: Activated Clotting Time: 348 seconds

## 2022-08-01 LAB — TROPONIN I (HIGH SENSITIVITY)
Troponin I (High Sensitivity): 6 ng/L (ref <18)
Troponin I (High Sensitivity): 8 ng/L (ref <18)

## 2022-08-01 LAB — PROTIME-INR
INR: 1.2 (ref 0.8–1.2)
Prothrombin Time: 14.6 seconds (ref 11.4–15.2)

## 2022-08-01 LAB — APTT: aPTT: 34 seconds (ref 24–36)

## 2022-08-01 SURGERY — PERIPHERAL VASCULAR THROMBECTOMY
Anesthesia: LOCAL

## 2022-08-01 MED ORDER — ATORVASTATIN CALCIUM 40 MG PO TABS: 40.0000 mg | ORAL_TABLET | Freq: Every morning | ORAL | Status: DC

## 2022-08-01 MED ORDER — MIDAZOLAM HCL 2 MG/2ML IJ SOLN
INTRAMUSCULAR | Status: AC
  Filled 2022-08-01: qty 2

## 2022-08-01 MED ORDER — IODIXANOL 320 MG/ML IV SOLN
INTRAVENOUS | Status: DC | PRN
  Administered 2022-08-01: 80 mL

## 2022-08-01 MED ORDER — SODIUM CHLORIDE 0.9 % IV SOLN
INTRAVENOUS | Status: AC | PRN
  Administered 2022-08-01: 10 mL/h via INTRAVENOUS

## 2022-08-01 MED ORDER — ROSUVASTATIN CALCIUM 10 MG PO TABS: 10.0000 mg | ORAL_TABLET | Freq: Every day | ORAL | Status: DC

## 2022-08-01 MED ORDER — ONDANSETRON HCL 4 MG/2ML IJ SOLN
4.0000 mg | Freq: Four times a day (QID) | INTRAMUSCULAR | Status: DC | PRN
  Administered 2022-08-01: 4 mg via INTRAVENOUS
  Filled 2022-08-01: qty 2

## 2022-08-01 MED ORDER — HEPARIN BOLUS VIA INFUSION
5000.0000 [IU] | Freq: Once | INTRAVENOUS | Status: AC
  Administered 2022-08-01: 5000 [IU] via INTRAVENOUS
  Filled 2022-08-01: qty 5000

## 2022-08-01 MED ORDER — LACTATED RINGERS IV BOLUS
1000.0000 mL | Freq: Once | INTRAVENOUS | Status: AC
  Administered 2022-08-01: 1000 mL via INTRAVENOUS

## 2022-08-01 MED ORDER — ONDANSETRON HCL 4 MG/2ML IJ SOLN
4.0000 mg | Freq: Once | INTRAMUSCULAR | Status: AC
  Administered 2022-08-01: 4 mg via INTRAVENOUS
  Filled 2022-08-01: qty 2

## 2022-08-01 MED ORDER — FERROUS SULFATE 325 (65 FE) MG PO TABS: 325.0000 mg | ORAL_TABLET | Freq: Every morning | ORAL | Status: DC

## 2022-08-01 MED ORDER — VITAMIN D 25 MCG (1000 UNIT) PO TABS: 1000.0000 [IU] | ORAL_TABLET | Freq: Every morning | ORAL | Status: DC

## 2022-08-01 MED ORDER — MORPHINE SULFATE (PF) 4 MG/ML IV SOLN
4.0000 mg | Freq: Once | INTRAVENOUS | Status: AC
  Administered 2022-08-01: 4 mg via INTRAVENOUS
  Filled 2022-08-01: qty 1

## 2022-08-01 MED ORDER — MIDAZOLAM HCL 2 MG/2ML IJ SOLN
INTRAMUSCULAR | Status: DC | PRN
  Administered 2022-08-01: 1 mg via INTRAVENOUS

## 2022-08-01 MED ORDER — HEPARIN (PORCINE) IN NACL 1000-0.9 UT/500ML-% IV SOLN
INTRAVENOUS | Status: AC
  Filled 2022-08-01: qty 1000

## 2022-08-01 MED ORDER — HEPARIN SODIUM (PORCINE) 1000 UNIT/ML IJ SOLN
INTRAMUSCULAR | Status: DC | PRN
  Administered 2022-08-01: 5000 [IU] via INTRAVENOUS

## 2022-08-01 MED ORDER — LIDOCAINE HCL (PF) 1 % IJ SOLN
INTRAMUSCULAR | Status: DC | PRN
  Administered 2022-08-01: 18 mL

## 2022-08-01 MED ORDER — ACETAMINOPHEN 325 MG PO TABS
650.0000 mg | ORAL_TABLET | ORAL | Status: DC | PRN
  Administered 2022-08-01 – 2022-08-02 (×2): 650 mg via ORAL
  Filled 2022-08-01 (×2): qty 2

## 2022-08-01 MED ORDER — HYDROMORPHONE HCL 1 MG/ML IJ SOLN
1.0000 mg | Freq: Once | INTRAMUSCULAR | Status: AC
  Administered 2022-08-01: 1 mg via INTRAVENOUS
  Filled 2022-08-01: qty 1

## 2022-08-01 MED ORDER — SODIUM CHLORIDE 0.9 % IV SOLN: INTRAVENOUS | Status: DC

## 2022-08-01 MED ORDER — HEPARIN (PORCINE) 25000 UT/250ML-% IV SOLN: 1200.0000 [IU]/h | INTRAVENOUS | Status: DC

## 2022-08-01 MED ORDER — HEPARIN (PORCINE) 25000 UT/250ML-% IV SOLN
INTRAVENOUS | Status: AC
  Administered 2022-08-01: 1200 [IU]/h via INTRAVENOUS
  Filled 2022-08-01: qty 250

## 2022-08-01 MED ORDER — PANTOPRAZOLE SODIUM 40 MG PO TBEC: 40.0000 mg | DELAYED_RELEASE_TABLET | Freq: Every day | ORAL | Status: DC

## 2022-08-01 MED ORDER — SODIUM CHLORIDE 0.9 % IV BOLUS
500.0000 mL | Freq: Once | INTRAVENOUS | Status: AC
  Administered 2022-08-01: 500 mL via INTRAVENOUS

## 2022-08-01 MED ORDER — IOHEXOL 350 MG/ML SOLN
100.0000 mL | Freq: Once | INTRAVENOUS | Status: AC | PRN
  Administered 2022-08-01: 100 mL via INTRAVENOUS

## 2022-08-01 MED ORDER — LIDOCAINE HCL (PF) 1 % IJ SOLN
INTRAMUSCULAR | Status: AC
  Filled 2022-08-01: qty 30

## 2022-08-01 MED ORDER — SODIUM CHLORIDE 0.9% FLUSH
3.0000 mL | Freq: Two times a day (BID) | INTRAVENOUS | Status: DC
Start: 2022-08-01 — End: 2022-08-07
  Administered 2022-08-01 – 2022-08-07 (×10): 3 mL via INTRAVENOUS

## 2022-08-01 MED ORDER — SODIUM CHLORIDE 0.9 % IV SOLN: 250.0000 mL | INTRAVENOUS | Status: DC | PRN

## 2022-08-01 MED ORDER — SODIUM CHLORIDE 0.9% FLUSH: 3.0000 mL | INTRAVENOUS | Status: DC | PRN

## 2022-08-01 MED ORDER — FLUMAZENIL 1 MG/10ML IV SOLN
INTRAVENOUS | Status: AC
  Filled 2022-08-01: qty 10

## 2022-08-01 MED ORDER — SODIUM CHLORIDE (PF) 0.9 % IJ SOLN
INTRAMUSCULAR | Status: AC
  Filled 2022-08-01: qty 50

## 2022-08-01 MED ORDER — HEPARIN (PORCINE) 25000 UT/250ML-% IV SOLN
1000.0000 [IU]/h | INTRAVENOUS | Status: DC
  Administered 2022-08-01: 1000 [IU]/h via INTRAVENOUS
  Filled 2022-08-01: qty 250

## 2022-08-01 MED ORDER — FENTANYL CITRATE (PF) 100 MCG/2ML IJ SOLN
INTRAMUSCULAR | Status: AC
  Filled 2022-08-01: qty 2

## 2022-08-01 MED ORDER — FLUMAZENIL 1 MG/10ML IV SOLN
INTRAVENOUS | Status: DC | PRN
  Administered 2022-08-01: .2 mg via INTRAVENOUS

## 2022-08-01 MED ORDER — LABETALOL HCL 5 MG/ML IV SOLN: 10.0000 mg | INTRAVENOUS | Status: DC | PRN

## 2022-08-01 MED ORDER — PIPERACILLIN-TAZOBACTAM 3.375 G IVPB 30 MIN
3.3750 g | Freq: Once | INTRAVENOUS | Status: AC
Start: 2022-08-01 — End: 2022-08-01
  Administered 2022-08-01: 3.375 g via INTRAVENOUS
  Filled 2022-08-01: qty 50

## 2022-08-01 MED ORDER — HYDRALAZINE HCL 20 MG/ML IJ SOLN
5.0000 mg | INTRAMUSCULAR | Status: DC | PRN
  Administered 2022-08-04: 5 mg via INTRAVENOUS
  Filled 2022-08-01: qty 1

## 2022-08-01 MED ORDER — FENTANYL CITRATE (PF) 100 MCG/2ML IJ SOLN
INTRAMUSCULAR | Status: DC | PRN
  Administered 2022-08-01: 50 ug via INTRAVENOUS

## 2022-08-01 MED ORDER — METOPROLOL SUCCINATE ER 25 MG PO TB24: 25.0000 mg | ORAL_TABLET | Freq: Every day | ORAL | Status: DC

## 2022-08-01 MED ORDER — ASPIRIN 81 MG PO TBEC: 81.0000 mg | DELAYED_RELEASE_TABLET | Freq: Every day | ORAL | Status: DC

## 2022-08-01 MED ORDER — SODIUM CHLORIDE 0.9 % IV SOLN: INTRAVENOUS | Status: AC

## 2022-08-01 MED ORDER — HEPARIN (PORCINE) IN NACL 1000-0.9 UT/500ML-% IV SOLN
INTRAVENOUS | Status: DC | PRN
  Administered 2022-08-01 (×2): 500 mL

## 2022-08-01 SURGICAL SUPPLY — 12 items
CANISTER PENUMBRA ENGINE (MISCELLANEOUS) ×1 IMPLANT
CATH LIGHTNING BOLT 7 130 (CATHETERS) ×1 IMPLANT
CATH OMNI FLUSH 5F 65CM (CATHETERS) ×1 IMPLANT
CLOSURE MYNX CONTROL 6F/7F (Vascular Products) ×1 IMPLANT
KIT MICROPUNCTURE NIT STIFF (SHEATH) ×1 IMPLANT
SHEATH PINNACLE 5F 10CM (SHEATH) ×1 IMPLANT
SHEATH PINNACLE 7F 10CM (SHEATH) ×1 IMPLANT
SHEATH PINNACLE ST 7F 45CM (SHEATH) ×1 IMPLANT
SHEATH PROBE COVER 6X72 (BAG) ×1 IMPLANT
WIRE BENTSON .035X145CM (WIRE) ×1 IMPLANT
WIRE G V18X300CM (WIRE) ×1 IMPLANT
WIRE ROSEN-J .035X180CM (WIRE) ×2 IMPLANT

## 2022-08-01 NOTE — Progress Notes (Signed)
ANTICOAGULATION CONSULT NOTE   Pharmacy Consult for IV heparin Indication:  SMA   occlusion s/p thrombectomy  No Known Allergies  Patient Measurements: Height: '5\' 10"'$  (177.8 cm) Weight: 68 kg (150 lb) IBW/kg (Calculated) : 73 Heparin Dosing Weight: 68 kg  Vital Signs: Temp: 97.6 F (36.4 C) (08/07 1729) Temp Source: Oral (08/07 1729) BP: 120/66 (08/07 1729) Pulse Rate: 63 (08/07 1729)  Labs: Recent Labs    08/06/2022 1003 08/23/2022 1032 08/08/2022 1133 07/28/2022 1144 08/12/2022 1203  HGB 16.1 15.6  --   --   --   HCT 47.7 46.0  --   --   --   PLT 136*  --   --   --   --   APTT  --   --   --  34  --   LABPROT  --   --  14.6  --   --   INR  --   --  1.2  --   --   HEPARINUNFRC  --   --   --  <0.10*  --   CREATININE 1.32* 1.10  --   --   --   TROPONINIHS 6  --   --   --  8     Estimated Creatinine Clearance: 39.5 mL/min (by C-G formula based on SCr of 1.1 mg/dL).   Assessment: Pharmacy is consulted to dose heparin in 88 yo male diagnosed with SMA occlusion.  Per med rec, Pt was on Eliquis previously for afib but had stopped taking the med about 3 years ago due to rectal bleeding and bruising (baseline aPTT 34 sec).  PMH includes Ischemic left leg that required Iliofemoral thromboembolectomy in 2019.    S/p mechanical thrombectomy 8/7, heparin gtt not continued in cath lab. Extra 5000 units heparin given in cath lab. Consult from vascular to restart heparin 8/7 post procedure at 1800.  Goal of Therapy:  Heparin level 0.3-0.7 units/ml Monitor platelets by anticoagulation protocol: Yes   Plan:  Restart heparin 1000 units/hr Obtain heparin level 8 hours after re-start of infusion F/u long-term AC plans  Sherlon Handing, PharmD, BCPS Please see amion for complete clinical pharmacist phone list 07/31/2022 5:34 PM

## 2022-08-01 NOTE — ED Notes (Signed)
[facility] here to transfer pt to [facility].  Report given to [facility] paramedic.  Pt vitally stable and in NAD upon transfer.

## 2022-08-01 NOTE — ED Provider Notes (Signed)
Patient arrived in transfer from [facility].  Patient was there for acute onset abdominal discomfort.  Was found to have acute occlusion of the SMA.  Was started on heparin and was transferred here for vascular surgery evaluation.  Patient feels like his pain is quite a bit better but still rates it an 8 out of 10.  We will give another dose of pain medicine here.  Vascular surgery notified. To take for intervention.     Deno Etienne, DO 07/30/2022 1435

## 2022-08-01 NOTE — Progress Notes (Signed)
Pt passed a stool with dark blood contents MD notified, orders received and placed. Will pass along to Night Shift RN to continue to update and monitor

## 2022-08-01 NOTE — Op Note (Addendum)
    Patient name: George Potter MRN: [id number] DOB: 1926/09/18 Sex: male  07/31/2022 Pre-operative Diagnosis: Acute mesenteric ischemia Post-operative diagnosis:  Same Surgeon:  Erlene Quan C. Donzetta Matters, MD Procedure Performed: 1.  Ultrasound-guided cannulation right common femoral artery 2.  Aortogram 3.  Selection superior mesenteric artery 4.  Percutaneous mechanical thrombectomy superior mesenteric artery with CAT 7 lightning penumbra 5.  Mynx device closure right common femoral artery 6.  Moderate sedation with fentanyl and Versed for 44 minutes  Indications: 88 year old male presenting with acute mesenteric ischemia today with distal SMA thrombus and is now indicated for angiography with possible mechanical thrombectomy.  Findings: There was distal thrombus in the SMA this was aspirated at completion there was much improved flow distally in the SMA with illumination of multiple distal branches.  There was visible thrombus within the canister at completion.   Procedure:  The patient was identified in the holding area and taken to room 8.  The patient was then placed supine on the table and prepped and draped in the usual sterile fashion.  A time out was called.  Ultrasound was used to evaluate the right common femoral artery which was noted be patent and compressible.  The area was anesthetized with 1% lidocaine cannulated with micropuncture needle followed by wire sheath.  Images saved the permanent record.  Concomitantly moderate sedation was administered with fentanyl and Versed and his vital signs were monitored throughout the case.  We placed a Bentson wire followed by 5 [place] sheath performed aortogram and a lateral view.  I then placed a Bentson wire back placed a long 7 [place] sheath.  Patient was given additional 5000 units of heparin.  The SMA was cannulated with an Omni catheter and Bentson wire and then I placed a Rosen wire and the 7 French sheath was placed into the SMA.  Additional  angiography was performed.  We then performed percutaneous mechanical thrombectomy on 2 separate passes there was much better illumination of the distal SMA.  Satisfied with this we removed the catheter sheath was retracted over wire exchanged for a short 7 French sheath and a minx device was deployed.  He tolerated procedure well without any complication.   Contrast: 70cc  EBL: 200cc  Keishon Chavarin C. Donzetta Matters, MD [facility] [place] Office: [phone] Pager: [phone]

## 2022-08-01 NOTE — ED Triage Notes (Signed)
Pt endorses abd pain that radiates around his sides to his back, pain started around 6 am. Denies CP.

## 2022-08-01 NOTE — ED Notes (Signed)
Report called to Franne Forts RN @ [facility] ED.  Report given to [facility].

## 2022-08-01 NOTE — ED Notes (Signed)
Patients heart rate ranges from 90-160 afib rhythm.

## 2022-08-01 NOTE — Progress Notes (Signed)
ANTICOAGULATION CONSULT NOTE -    Pharmacy Consult for IV heparin Indication:  SMA   occulsion  No Known Allergies  Patient Measurements: Height: '5\' 10"'$  (177.8 cm) Weight: 68 kg (150 lb) IBW/kg (Calculated) : 73 Heparin Dosing Weight:   Vital Signs: Temp: 97.5 F (36.4 C) (08/07 1005) Temp Source: Oral (08/07 1005) BP: 157/126 (08/07 1100) Pulse Rate: 104 (08/07 1100)  Labs: Recent Labs    08/06/2022 1003 07/27/2022 1032  HGB 16.1 15.6  HCT 47.7 46.0  PLT 136*  --   CREATININE 1.32* 1.10  TROPONINIHS 6  --     Estimated Creatinine Clearance: 39.5 mL/min (by C-G formula based on SCr of 1.1 mg/dL).   Medications:  Scheduled:   Assessment: Pharmacy is consulted to dose heparin in 88 yo male diagnosed with SMA occlusion.  Per med rec, Pt was on Eliquis previously but had stopped taking the med.  PMH includes Ischemic left leg that required Iliofemoral thromboembolectomy in 2019.  Plan is for emergent transfer to [facility].   Today, 08/20/2022 Due to emergent nature of diagnosis will order full anticoagulation without baseline labs returning as well as full completion of med rec  Hgb 15.6 Scr 1.10 mg/dl  HL <0.10 indicating no recent DOAC use aPTT 34    Goal of Therapy:  Heparin level 0.3-0.7 units/ml Monitor platelets by anticoagulation protocol: Yes   Plan:  Heparin 5000 unit bolus followed by heparin IV 1200 units/hr  Obtain HL 8 hours after start of infusion Daily CBC while on heparin Monitor for signs and symptoms of bleeding  Royetta Asal, PharmD, BCPS 08/21/2022 12:29 PM

## 2022-08-01 NOTE — Progress Notes (Signed)
During bed side report, patient had passed large dark bloody stool. Awaiting lab for blood draw.

## 2022-08-01 NOTE — Consult Note (Addendum)
[facility] NOTE   MRN : [id number]  Reason for Consult: abdominal plain Referring Physician: D[facility]  History of Present Illness: 88 y/o male brought to the ED with new on set abdominal pain.  He states he ate a honey bun and coffee at 530 am today, then at 6 am he started having abdominal pain more so on the right LQ.   He has a history of Ischemic left leg that required Iliofemoral requiring thromboembolectomy by Dr. Trula Slade 10/14/2018.   He has known A fib and is managed on Eliquis until 3 years ago he stopped it due to rectal bleeding and bruising.          Current Facility-Administered Medications  Medication Dose Route Frequency Provider Last Rate Last Admin   HYDROmorphone (DILAUDID) injection 1 mg  1 mg Intravenous Once Hayden Rasmussen, MD       piperacillin-tazobactam (ZOSYN) IVPB 3.375 g  3.375 g Intravenous Once Hayden Rasmussen, MD 100 mL/hr at 07/26/2022 1122 3.375 g at 08/03/2022 1122   Current Outpatient Medications  Medication Sig Dispense Refill   apixaban (ELIQUIS) 5 MG TABS tablet Take 5 mg by mouth 2 (two) times daily. (Patient not taking: Reported on 08/17/2022)     atorvastatin (LIPITOR) 40 MG tablet Take 40 mg by mouth daily.     bismuth subsalicylate (PEPTO BISMOL) 262 MG/15ML suspension Take 30 mLs by mouth every 6 (six) hours as needed for diarrhea or loose stools.     cefdinir (OMNICEF) 300 MG capsule Take 300 mg by mouth 2 (two) times daily. Started on 10-11-18 Duration 7 days  0   chlorthalidone (HYGROTON) 25 MG tablet Take 12.5 mg by mouth daily.  3   Cholecalciferol (VITAMIN D3) 1000 units CAPS Take 2,000 Units by mouth daily.     losartan (COZAAR) 100 MG tablet Take 100 mg by mouth daily.  1   metoprolol succinate (TOPROL-XL) 25 MG 24 hr tablet Take 1 tablet (25 mg total) by mouth daily. 30 tablet 3   Omega-3 Fatty Acids (FISH OIL) 1200 MG CAPS Take 1,200 mg by mouth daily.     polyvinyl alcohol-povidone (REFRESH) 1.4-0.6  % ophthalmic solution Place 1-2 drops into both eyes as needed (for dry eyes).     potassium chloride (K-DUR) 10 MEQ tablet Take 2 tablets (20 mEq total) by mouth daily. 60 tablet 0   valsartan (DIOVAN) 80 MG tablet Take 80 mg by mouth daily.      Pt meds include: Statin :Yes Betablocker: Yes ASA: Yes Other anticoagulants/antiplatelets: none  Past Medical History:  Diagnosis Date   Atrial fibrillation, chronic (HCC)    a. on Eliquis   Diverticulosis    HTN (hypertension)    Mild   Severe mitral regurgitation     Past Surgical History:  Procedure Laterality Date   BACK SURGERY     Partial facetectomy and decompression L4-L5   CATARACT EXTRACTION Right 2001   HERNIA REPAIR Right 07/25/2008   Right inguinal herniorrhaphy using a large polypropylene   INTRAOCULAR LENS INSERTION Right 2001   NASAL SINUS SURGERY     multiple surgeries   THROMBECTOMY FEMORAL ARTERY Left 10/14/2018   Procedure: Iliofemoral Thromboembolectomy Left Leg;  Surgeon: Serafina Mitchell, MD;  Location: [facility] OR;  Service: Vascular;  Laterality: Left;    Social History Social History   Tobacco Use   Smoking status: Never   Smokeless tobacco: Never  Vaping Use   Vaping Use: Never  used    Family History Family History  Problem Relation Age of Onset   Atrial fibrillation Neg Hx     No Known Allergies   REVIEW OF SYSTEMS  General: '[ ]'$  Weight loss, '[ ]'$  Fever, '[ ]'$  chills Neurologic: '[ ]'$  Dizziness, '[ ]'$  Blackouts, '[ ]'$  Seizure '[ ]'$  Stroke, '[ ]'$  "Mini stroke", '[ ]'$  Slurred speech, '[ ]'$  Temporary blindness; '[ ]'$  weakness in arms or legs, '[ ]'$  Hoarseness '[ ]'$  Dysphagia Cardiac: '[ ]'$  Chest pain/pressure, '[ ]'$  Shortness of breath at rest '[ ]'$  Shortness of breath with exertion, [x ] Atrial fibrillation or irregular heartbeat  Vascular: '[ ]'$  Pain in legs with walking, '[ ]'$  Pain in legs at rest, '[ ]'$  Pain in legs at night,  '[ ]'$  Non-healing ulcer, '[ ]'$  Blood clot in vein/DVT,   Pulmonary: '[ ]'$  Home oxygen, '[ ]'$  Productive  cough, '[ ]'$  Coughing up blood, '[ ]'$  Asthma,  '[ ]'$  Wheezing '[ ]'$  COPD Musculoskeletal:  '[ ]'$  Arthritis, '[ ]'$  Low back pain, '[ ]'$  Joint pain Hematologic: '[ ]'$  Easy Bruising, '[ ]'$  Anemia; '[ ]'$  Hepatitis Gastrointestinal: '[ ]'$  Blood in stool, '[ ]'$  Gastroesophageal Reflux/heartburn, Urinary: '[ ]'$  chronic Kidney disease, '[ ]'$  on HD - '[ ]'$  MWF or '[ ]'$  TTHS, '[ ]'$  Burning with urination, '[ ]'$  Difficulty urinating Skin: '[ ]'$  Rashes, '[ ]'$  Wounds Psychological: '[ ]'$  Anxiety, '[ ]'$  Depression  Physical Examination Vitals:   08/14/2022 1005 08/03/2022 1015 08/20/2022 1030 08/19/2022 1100  BP:  (!) 196/92 (!) 191/120 (!) 157/126  Pulse:  93 96 (!) 104  Resp:  (!) 31 (!) 25 18  Temp: (!) 97.5 F (36.4 C)     TempSrc: Oral     SpO2:  100% 100% 98%  Weight:      Height:       Body mass index is 21.52 kg/m.  General:  WDWN in NAD Gait: well with walker HENT: WNL Eyes: Pupils equal Pulmonary: normal non-labored breathing , without Rales, rhonchi,  wheezing Cardiac: irregularly irregular, without  Murmurs, rubs or gallops; No carotid bruits Abdomen: soft, tender to palpation left LQ, no masses Skin: no rashes, ulcers noted;  no Gangrene , no cellulitis; no open wounds;   Vascular Exam/Pulses:radial, femoral pulses.  No pedal pulses.  No ischemic changes.     Musculoskeletal: no muscle wasting or atrophy; no edema  Neurologic: A&O X 3; Appropriate Affect ;  SENSATION: normal; MOTOR FUNCTION: 5/5 Symmetric Speech is fluent/normal   Significant Diagnostic Studies: CBC Lab Results  Component Value Date   WBC 6.8 08/19/2022   HGB 15.6 08/24/2022   HCT 46.0 08/21/2022   MCV 86.4 08/22/2022   PLT 136 (L) 08/21/2022    BMET    Component Value Date/Time   NA 136 07/30/2022 1032   K 3.2 (L) 08/14/2022 1032   CL 100 08/23/2022 1032   CO2 19 (L) 08/13/2022 1003   GLUCOSE 168 (H) 08/09/2022 1032   BUN 9 08/16/2022 1032   CREATININE 1.10 08/06/2022 1032   CALCIUM 9.3 08/16/2022 1003   GFRNONAA 50 (L) 08/14/2022  1003   GFRAA 50 (L) 09/25/2019 0413   Estimated Creatinine Clearance: 39.5 mL/min (by C-G formula based on SCr of 1.1 mg/dL).  COAG Lab Results  Component Value Date   INR 1.0 07/22/2008     Non-Invasive Vascular Imaging:  EXAM: CT ANGIOGRAPHY CHEST, ABDOMEN AND PELVIS   TECHNIQUE: Non-contrast CT of the chest was initially obtained.  Multidetector CT imaging through the chest, abdomen and pelvis was performed using the standard protocol during bolus administration of intravenous contrast. Multiplanar reconstructed images and MIPs were obtained and reviewed to evaluate the vascular anatomy.   RADIATION DOSE REDUCTION: This exam was performed according to the departmental dose-optimization program which includes automated exposure control, adjustment of the mA and/or kV according to patient size and/or use of iterative reconstruction technique.   CONTRAST:  13m OMNIPAQUE IOHEXOL 350 MG/ML SOLN   COMPARISON:  Chest x-ray from same day.   FINDINGS: CTA CHEST FINDINGS   Cardiovascular: No thoracic aorta intramural hematoma. Preferential opacification of the thoracic aorta. No evidence of thoracic aortic aneurysm or dissection. Coronary, aortic arch, and branch vessel atherosclerotic vascular disease. Mild cardiomegaly with biatrial enlargement. No pericardial effusion. No pulmonary embolism.   Mediastinum/Nodes: No enlarged mediastinal, hilar, or axillary lymph nodes. Patulous esophagus. Thyroid gland and trachea demonstrate no significant findings.   Lungs/Pleura: No focal consolidation, pleural effusion, or pneumothorax.   Musculoskeletal: No chest wall abnormality. No acute or significant osseous findings.   Review of the MIP images confirms the above findings.   CTA ABDOMEN AND PELVIS FINDINGS   VASCULAR   Aorta: Normal caliber aorta without aneurysm, dissection, vasculitis or significant stenosis. Moderate to severe atherosclerotic calcification.    Celiac: Patent without evidence of aneurysm, dissection, vasculitis or significant stenosis.   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 Reconstitution of distal branch vessels.   Renals: Both renal arteries are patent without evidence of aneurysm, dissection, vasculitis, fibromuscular dysplasia or significant stenosis.   IMA: Patent without evidence of aneurysm, dissection, vasculitis or significant stenosis.   Inflow: Patent without evidence of aneurysm, dissection, vasculitis or significant stenosis.   Veins: No obvious venous abnormality within the limitations of this arterial phase study.   Review of the MIP images confirms the above findings.   NON-VASCULAR   Hepatobiliary: No focal liver abnormality is seen. No gallstones, gallbladder wall thickening, or biliary dilatation.   Pancreas: Unremarkable. No pancreatic ductal dilatation or surrounding inflammatory changes.   Spleen: Normal in size without focal abnormality.   Adrenals/Urinary Tract: Adrenal glands are unremarkable. Kidneys are normal, without renal calculi, solid lesion, or hydronephrosis. Simple cyst in the left kidney measuring up to 1.6 cm. No follow-up imaging is recommended. Bladder is unremarkable.   Stomach/Bowel: Stomach is within normal limits. Appendix appears normal. No evidence of bowel wall thickening, distention, or inflammatory changes. No pneumatosis. Left-sided colonic diverticulosis.   Lymphatic: No enlarged abdominal or pelvic lymph nodes.   Reproductive: Prostate is unremarkable.   Other: No abdominal wall hernia or abnormality. No abdominopelvic ascites. No pneumoperitoneum.   Musculoskeletal: No acute or significant osseous findings.   Review of the MIP images confirms the above findings.   IMPRESSION: 1. Acute appearing occlusive thrombus of the distal SMA  involving a length of approximately 3.6 cm. Reconstitution of distal branch vessels. No CT evidence of bowel ischemia at this time. 2. No evidence of aortic aneurysm or dissection. 3. Aortic Atherosclerosis (ICD10-I70.0).    ASSESSMENT/PLAN:   SMA: Acute appearing occlusive thrombus of the distal SMA involving a length of approximately 3.6 cm in the setting of anticoagulation with Eliquis and history of Ischemic left leg that required Iliofemoral thromboembolectomy by Dr. BTrula Slade10/20/2019.    Heparin started, NPO with plans to go to the [facility] for SMA thrombectomy.  He will need  to be on anticoagulation if he tolerates it likely for life.   Roxy Horseman 07/29/2022 11:38 AM  I have independently interviewed and examined patient and agree with PA assessment and plan above. Plan for sma thrombectomy in catheter lab today. Will need serial abdominal checks. Does not appear toxic at this point. Discussed need for possible operative exploration.   Eugine Bubb C. Donzetta Matters, MD [facility] Office: [phone] Pager: [phone]

## 2022-08-01 NOTE — ED Provider Notes (Signed)
[facility] DEPT Provider Note   CSN: [id number] Arrival date & time: 08/03/2022  5009     History {Add pertinent medical, surgical, social history, OB history to HPI:1} Chief Complaint  Patient presents with   Abdominal Pain   Back Pain    George Potter is a 88 y.o. male.  He is brought in by his son for evaluation of abdominal pain.  He has a history of A-fib on anticoagulation, left femoral thrombectomy.  He has some baseline dizziness and unsteady gait.  Patient states he woke up at 6 AM with severe generalized abdominal pain radiating through to his back.  10 out of 10 constant.  Denies any nausea vomiting chest pain or shortness of breath.  No urinary symptoms.  Said he moved his bowels 3 times today and it was hard, associated with a scant amount of blood.  The history is provided by the patient and a relative.  Abdominal Pain Pain location:  Generalized Pain radiates to:  Back Pain severity:  Severe Onset quality:  Sudden Duration:  4 hours Timing:  Constant Progression:  Unchanged Chronicity:  New Context: not trauma   Relieved by:  None tried Worsened by:  Nothing Ineffective treatments:  None tried Associated symptoms: no cough and no shortness of breath        Home Medications Prior to Admission medications   Medication Sig Start Date End Date Taking? Authorizing Provider  apixaban (ELIQUIS) 5 MG TABS tablet Take 5 mg by mouth 2 (two) times daily.    [provider]  atorvastatin (LIPITOR) 40 MG tablet Take 40 mg by mouth daily.    [provider]  bismuth subsalicylate (PEPTO BISMOL) 262 MG/15ML suspension Take 30 mLs by mouth every 6 (six) hours as needed for diarrhea or loose stools.    [provider]  cefdinir (OMNICEF) 300 MG capsule Take 300 mg by mouth 2 (two) times daily. Started on 10-11-18 Duration 7 days 10/11/18   [provider]  chlorthalidone (HYGROTON) 25 MG tablet Take 12.5 mg by  mouth daily. 08/15/18   [provider]  Cholecalciferol (VITAMIN D3) 1000 units CAPS Take 2,000 Units by mouth daily.    [provider]  losartan (COZAAR) 100 MG tablet Take 100 mg by mouth daily. 07/17/18   [provider]  metoprolol succinate (TOPROL-XL) 25 MG 24 hr tablet Take 1 tablet (25 mg total) by mouth daily. 10/18/18   Dagoberto Ligas, PA-C  Omega-3 Fatty Acids (FISH OIL) 1200 MG CAPS Take 1,200 mg by mouth daily.    [provider]  polyvinyl alcohol-povidone (REFRESH) 1.4-0.6 % ophthalmic solution Place 1-2 drops into both eyes as needed (for dry eyes).    [provider]  potassium chloride (K-DUR) 10 MEQ tablet Take 2 tablets (20 mEq total) by mouth daily. 10/17/18   Dagoberto Ligas, PA-C  valsartan (DIOVAN) 80 MG tablet Take 80 mg by mouth daily.    [provider]      Allergies    Patient has no known allergies.    Review of Systems   Review of Systems  Constitutional:  Negative for fever.  Eyes:  Negative for visual disturbance.  Respiratory:  Negative for cough and shortness of breath.   Gastrointestinal:  Positive for abdominal pain and constipation. Negative for diarrhea, nausea and vomiting.  Musculoskeletal:  Positive for back pain.  Skin:  Negative for rash.  Neurological:  Positive for dizziness.    Physical Exam Updated  Vital Signs BP (!) 196/87 (BP Location: Left Arm)   Pulse 100   Resp (!) 24   Ht '5\' 10"'$  (1.778 m)   Wt 68 kg   SpO2 100%   BMI 21.52 kg/m  Physical Exam Vitals and nursing note reviewed.  Constitutional:      General: He is in acute distress.     Appearance: Normal appearance. He is well-developed.  HENT:     Head: Normocephalic and atraumatic.  Eyes:     Conjunctiva/sclera: Conjunctivae normal.  Cardiovascular:     Rate and Rhythm: Normal rate. Rhythm irregular.     Heart sounds: No murmur heard. Pulmonary:     Effort: Pulmonary effort is normal. No respiratory distress.      Breath sounds: Normal breath sounds.  Abdominal:     Palpations: Abdomen is soft. There is no pulsatile mass.     Tenderness: There is generalized abdominal tenderness. There is no guarding or rebound.  Musculoskeletal:     Cervical back: Neck supple.     Right lower leg: No edema.     Left lower leg: No edema.     Comments: DP pulse is 2+ bilaterally  Skin:    General: Skin is warm and dry.     Capillary Refill: Capillary refill takes less than 2 seconds.  Neurological:     General: No focal deficit present.     Mental Status: He is alert.     Motor: No weakness.     ED Results / Procedures / Treatments   Labs (all labs ordered are listed, but only abnormal results are displayed) Labs Reviewed  COMPREHENSIVE METABOLIC PANEL - Abnormal; Notable for the following components:      Result Value   Sodium 134 (*)    Potassium 2.9 (*)    CO2 19 (*)    Glucose, Bld 168 (*)    Creatinine, Ser 1.32 (*)    Total Bilirubin 1.8 (*)    GFR, Estimated 50 (*)    All other components within normal limits  LACTIC ACID, PLASMA - Abnormal; Notable for the following components:   Lactic Acid, Venous 5.6 (*)    All other components within normal limits  CBC WITH DIFFERENTIAL/PLATELET - Abnormal; Notable for the following components:   Platelets 136 (*)    Abs Immature Granulocytes 0.11 (*)    All other components within normal limits  I-STAT CHEM 8, ED - Abnormal; Notable for the following components:   Potassium 3.2 (*)    Glucose, Bld 168 (*)    Calcium, Ion 1.05 (*)    TCO2 18 (*)    All other components within normal limits  LIPASE, BLOOD  LACTIC ACID, PLASMA  URINALYSIS, ROUTINE W REFLEX MICROSCOPIC  PROTIME-INR  TYPE AND SCREEN  TROPONIN I (HIGH SENSITIVITY)  TROPONIN I (HIGH SENSITIVITY)    EKG EKG Interpretation  Date/Time:  Monday August 01 2022 10:06:32 EDT Ventricular Rate:  110 PR Interval:    QRS Duration: 108 QT Interval:  369 QTC Calculation: 500 R  Axis:   0 Text Interpretation: Atrial fibrillation S1,S2,S3 pattern RSR' in V1 or V2, right VCD or RVH Borderline ST depression, lateral leads Borderline prolonged QT interval diffuse ST depressions more pronounced than prior 2023/11/11 Confirmed by Aletta Edouard [id number]) on 07/27/2022 10:08:46 AM  Radiology CT Angio Chest/Abd/Pel for Dissection W and/or W/WO  Result Date: 08/06/2022 CLINICAL DATA:  Abdominal pain radiating to both flanks since early this morning. Denies chest pain. EXAM: CT  ANGIOGRAPHY CHEST, ABDOMEN AND PELVIS TECHNIQUE: Non-contrast CT of the chest was initially obtained. Multidetector CT imaging through the chest, abdomen and pelvis was performed using the standard protocol during bolus administration of intravenous contrast. Multiplanar reconstructed images and MIPs were obtained and reviewed to evaluate the vascular anatomy. RADIATION DOSE REDUCTION: This exam was performed according to the departmental dose-optimization program which includes automated exposure control, adjustment of the mA and/or kV according to patient size and/or use of iterative reconstruction technique. CONTRAST:  140m OMNIPAQUE IOHEXOL 350 MG/ML SOLN COMPARISON:  Chest x-ray from same day. FINDINGS: CTA CHEST FINDINGS Cardiovascular: No thoracic aorta intramural hematoma. Preferential opacification of the thoracic aorta. No evidence of thoracic aortic aneurysm or dissection. Coronary, aortic arch, and branch vessel atherosclerotic vascular disease. Mild cardiomegaly with biatrial enlargement. No pericardial effusion. No pulmonary embolism. Mediastinum/Nodes: No enlarged mediastinal, hilar, or axillary lymph nodes. Patulous esophagus. Thyroid gland and trachea demonstrate no significant findings. Lungs/Pleura: No focal consolidation, pleural effusion, or pneumothorax. Musculoskeletal: No chest wall abnormality. No acute or significant osseous findings. Review of the MIP images confirms the above findings. CTA ABDOMEN AND  PELVIS FINDINGS VASCULAR Aorta: Normal caliber aorta without aneurysm, dissection, vasculitis or significant stenosis. Moderate to severe atherosclerotic calcification. Celiac: Patent without evidence of aneurysm, dissection, vasculitis or significant stenosis. 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 Reconstitution of distal branch vessels. Renals: Both renal arteries are patent without evidence of aneurysm, dissection, vasculitis, fibromuscular dysplasia or significant stenosis. IMA: Patent without evidence of aneurysm, dissection, vasculitis or significant stenosis. Inflow: Patent without evidence of aneurysm, dissection, vasculitis or significant stenosis. Veins: No obvious venous abnormality within the limitations of this arterial phase study. Review of the MIP images confirms the above findings. NON-VASCULAR Hepatobiliary: No focal liver abnormality is seen. No gallstones, gallbladder wall thickening, or biliary dilatation. Pancreas: Unremarkable. No pancreatic ductal dilatation or surrounding inflammatory changes. Spleen: Normal in size without focal abnormality. Adrenals/Urinary Tract: Adrenal glands are unremarkable. Kidneys are normal, without renal calculi, solid lesion, or hydronephrosis. Simple cyst in the left kidney measuring up to 1.6 cm. No follow-up imaging is recommended. Bladder is unremarkable. Stomach/Bowel: Stomach is within normal limits. Appendix appears normal. No evidence of bowel wall thickening, distention, or inflammatory changes. No pneumatosis. Left-sided colonic diverticulosis. Lymphatic: No enlarged abdominal or pelvic lymph nodes. Reproductive: Prostate is unremarkable. Other: No abdominal wall hernia or abnormality. No abdominopelvic ascites. No pneumoperitoneum. Musculoskeletal: No acute or significant osseous findings. Review of the MIP images confirms the above  findings. IMPRESSION: 1. 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 Reconstitution of distal branch vessels. No CT evidence of bowel ischemia at this time. 2. No evidence of aortic aneurysm or dissection. 3. Aortic Atherosclerosis (ICD10-I70.0). Critical Value/emergent results were called by telephone at the time of interpretation on 08/09/2022 at 11:17 am [facility] who verbally acknowledged these results. Electronically Signed   By: WTitus DubinM.D.   On: 08/09/2022 11:19   DG Chest Port 1 View  Result Date: 08/06/2022 CLINICAL DATA:  Abdominal pain, back pain EXAM: PORTABLE CHEST 1 VIEW COMPARISON:  07/09/2014 FINDINGS: Transverse diameter heart is slightly increased. Thoracic aorta is tortuous. There are no signs of pulmonary edema or focal pulmonary consolidation. There is no pleural effusion or pneumothorax. IMPRESSION: No active disease. Electronically Signed   By: PPrudy FeelerD.  On: 07/31/2022 10:18    Procedures .Critical Care  Performed by: Hayden Rasmussen, MD Authorized by: Hayden Rasmussen, MD   Critical care provider statement:    Critical care time (minutes):  45   Critical care time was exclusive of:  Separately billable procedures and treating other patients   Critical care was necessary to treat or prevent imminent or life-threatening deterioration of the following conditions:  Circulatory failure   Critical care was time spent personally by me on the following activities:  Development of treatment plan with patient or surrogate, discussions with consultants, evaluation of patient's response to treatment, examination of patient, obtaining history from patient or surrogate, ordering and performing treatments and interventions, ordering and review of laboratory studies, ordering and review of radiographic studies, pulse oximetry, re-evaluation of patient's condition and review of old charts   I  assumed direction of critical care for this patient from another provider in my specialty: no     {Document cardiac monitor, telemetry assessment procedure when appropriate:1}  Medications Ordered in ED Medications  sodium chloride 0.9 % bolus 500 mL (has no administration in time range)  ondansetron (ZOFRAN) injection 4 mg (has no administration in time range)  morphine (PF) 4 MG/ML injection 4 mg (has no administration in time range)    ED Course/ Medical Decision Making/ A&P Clinical Course as of 08/19/2022 1139  Mon Aug 01, 2022  1017 Portable chest interpreted by me as no free air normal mediastinum no acute infiltrates.  Awaiting radiology reading. [MB]  1036 Patient continues to endorse severe back pain abdominal pain.  Morphine not effective ordered Dilaudid.  Due to his known history of vascular disease I have put him in for an i-STAT and will get a CT dissection study.  Has had elevated creatinines in past.  Creatinine came back at 1.1 so updated CT to expedite him to the scanner. [MB]  1120 Received a call from radiology that patient has SMA occlusion.  He said there is no evidence of Infarction at this time yet.  Patient looks a little more comfortable after the Dilaudid.  Lactate elevated at 5.6.  Have ordered antibiotics and more fluids.  Placed consult to vascular surgery. [MB]  1137 Discussed with Dr. Donzetta Matters vascular surgery.  He is recommending heparin and transfer ED to ED for his evaluation.  Discussed with Dr. Doren Custard ED physician at [facility] accepts patient in transfer.  Updated patient and son of plan and they are in agreement.  Heparin ordered by pharmacy.  Will order more pain medications. [MB]    Clinical Course User Index [MB] Hayden Rasmussen, MD                           Medical Decision Making Amount and/or Complexity of Data Reviewed Labs: ordered. Radiology: ordered.  Risk Prescription drug management.  This patient complains of ***; this involves an  extensive number of treatment Options and is a complaint that carries with it a high risk of complications and morbidity. The differential includes ***  I ordered, reviewed and interpreted labs, which included *** I ordered medication *** and reviewed PMP when indicated. I ordered imaging studies which included *** and I independently    visualized and interpreted imaging which showed *** Additional history obtained from *** Previous records obtained and reviewed *** I consulted *** and discussed lab and imaging findings and discussed disposition.  Cardiac monitoring reviewed, *** Social determinants considered, ***  Critical Interventions: ***  After the interventions stated above, I reevaluated the patient and found *** Admission and further testing considered, ***    {Document critical care time when appropriate:1} {Document review of labs and clinical decision tools ie heart score, Chads2Vasc2 etc:1}  {Document your independent review of radiology images, and any outside records:1} {Document your discussion with family members, caretakers, and with consultants:1} {Document social determinants of health affecting pt's care:1} {Document your decision making why or why not admission, treatments were needed:1} Final Clinical Impression(s) / ED Diagnoses Final diagnoses:  None    Rx / DC Orders ED Discharge Orders     None

## 2022-08-01 NOTE — ED Notes (Signed)
Transfer from [facility] ED, Report received from [facility]    Abd pain 10/10 this am CT + ischemic bowel.  Sent to speak with surgeon in regards to findings.  BP 200/100 current pain 8/10  last dilaudid 1150am HR 90 afibs O2 96% RA

## 2022-08-01 NOTE — Progress Notes (Signed)
Pharmacy aware of bloody stool.awaiting lab results.

## 2022-08-01 NOTE — Progress Notes (Signed)
DR. Donzetta Matters paged about lab results. MD aware of CBC and BMET results, and WBC being 20.9. notified MD that after lab was drawn he had two dark bloody stools ( burgundy color with clots). Notified MD patient is alert, BP 129/72, complained of abdominal pain and nausea. Per MD continue heparin and check CBC in am. Care continues.

## 2022-08-02 ENCOUNTER — Observation Stay (HOSPITAL_COMMUNITY): Payer: Medicare Other

## 2022-08-02 ENCOUNTER — Observation Stay (HOSPITAL_COMMUNITY): Payer: Medicare Other | Admitting: Anesthesiology

## 2022-08-02 ENCOUNTER — Encounter (HOSPITAL_COMMUNITY): Admission: EM | Disposition: E | Payer: Self-pay | Source: Home / Self Care | Attending: Vascular Surgery

## 2022-08-02 ENCOUNTER — Encounter (HOSPITAL_COMMUNITY): Payer: Self-pay | Admitting: Vascular Surgery

## 2022-08-02 DIAGNOSIS — I739 Peripheral vascular disease, unspecified: Secondary | ICD-10-CM | POA: Diagnosis not present

## 2022-08-02 DIAGNOSIS — K551 Chronic vascular disorders of intestine: Secondary | ICD-10-CM | POA: Diagnosis present

## 2022-08-02 DIAGNOSIS — Z9911 Dependence on respirator [ventilator] status: Secondary | ICD-10-CM | POA: Diagnosis not present

## 2022-08-02 DIAGNOSIS — T8110XA Postprocedural shock unspecified, initial encounter: Secondary | ICD-10-CM | POA: Diagnosis not present

## 2022-08-02 DIAGNOSIS — K5712 Diverticulitis of small intestine without perforation or abscess without bleeding: Secondary | ICD-10-CM | POA: Diagnosis not present

## 2022-08-02 DIAGNOSIS — N179 Acute kidney failure, unspecified: Secondary | ICD-10-CM | POA: Diagnosis present

## 2022-08-02 DIAGNOSIS — J95821 Acute postprocedural respiratory failure: Secondary | ICD-10-CM | POA: Diagnosis present

## 2022-08-02 DIAGNOSIS — L89152 Pressure ulcer of sacral region, stage 2: Secondary | ICD-10-CM | POA: Diagnosis present

## 2022-08-02 DIAGNOSIS — R579 Shock, unspecified: Secondary | ICD-10-CM

## 2022-08-02 DIAGNOSIS — Z66 Do not resuscitate: Secondary | ICD-10-CM | POA: Diagnosis not present

## 2022-08-02 DIAGNOSIS — E440 Moderate protein-calorie malnutrition: Secondary | ICD-10-CM | POA: Diagnosis present

## 2022-08-02 DIAGNOSIS — I4891 Unspecified atrial fibrillation: Secondary | ICD-10-CM

## 2022-08-02 DIAGNOSIS — D696 Thrombocytopenia, unspecified: Secondary | ICD-10-CM | POA: Diagnosis present

## 2022-08-02 DIAGNOSIS — K559 Vascular disorder of intestine, unspecified: Secondary | ICD-10-CM | POA: Diagnosis not present

## 2022-08-02 DIAGNOSIS — Z4682 Encounter for fitting and adjustment of non-vascular catheter: Secondary | ICD-10-CM | POA: Diagnosis not present

## 2022-08-02 DIAGNOSIS — I482 Chronic atrial fibrillation, unspecified: Secondary | ICD-10-CM | POA: Diagnosis present

## 2022-08-02 DIAGNOSIS — I10 Essential (primary) hypertension: Secondary | ICD-10-CM

## 2022-08-02 DIAGNOSIS — R2681 Unsteadiness on feet: Secondary | ICD-10-CM | POA: Diagnosis present

## 2022-08-02 DIAGNOSIS — I080 Rheumatic disorders of both mitral and aortic valves: Secondary | ICD-10-CM | POA: Diagnosis not present

## 2022-08-02 DIAGNOSIS — K55029 Acute infarction of small intestine, extent unspecified: Secondary | ICD-10-CM | POA: Diagnosis not present

## 2022-08-02 DIAGNOSIS — I340 Nonrheumatic mitral (valve) insufficiency: Secondary | ICD-10-CM | POA: Diagnosis present

## 2022-08-02 DIAGNOSIS — K219 Gastro-esophageal reflux disease without esophagitis: Secondary | ICD-10-CM | POA: Diagnosis present

## 2022-08-02 DIAGNOSIS — Z01818 Encounter for other preprocedural examination: Secondary | ICD-10-CM | POA: Diagnosis not present

## 2022-08-02 DIAGNOSIS — I272 Pulmonary hypertension, unspecified: Secondary | ICD-10-CM | POA: Diagnosis present

## 2022-08-02 DIAGNOSIS — L8990 Pressure ulcer of unspecified site, unspecified stage: Secondary | ICD-10-CM | POA: Insufficient documentation

## 2022-08-02 DIAGNOSIS — K55019 Acute (reversible) ischemia of small intestine, extent unspecified: Secondary | ICD-10-CM

## 2022-08-02 DIAGNOSIS — Q430 Meckel's diverticulum (displaced) (hypertrophic): Secondary | ICD-10-CM | POA: Diagnosis not present

## 2022-08-02 DIAGNOSIS — R918 Other nonspecific abnormal finding of lung field: Secondary | ICD-10-CM | POA: Diagnosis not present

## 2022-08-02 DIAGNOSIS — Z515 Encounter for palliative care: Secondary | ICD-10-CM | POA: Diagnosis not present

## 2022-08-02 DIAGNOSIS — J9600 Acute respiratory failure, unspecified whether with hypoxia or hypercapnia: Secondary | ICD-10-CM | POA: Diagnosis not present

## 2022-08-02 DIAGNOSIS — E872 Acidosis, unspecified: Secondary | ICD-10-CM | POA: Diagnosis present

## 2022-08-02 DIAGNOSIS — S31109A Unspecified open wound of abdominal wall, unspecified quadrant without penetration into peritoneal cavity, initial encounter: Secondary | ICD-10-CM | POA: Diagnosis not present

## 2022-08-02 DIAGNOSIS — E876 Hypokalemia: Secondary | ICD-10-CM | POA: Diagnosis present

## 2022-08-02 DIAGNOSIS — Z7189 Other specified counseling: Secondary | ICD-10-CM | POA: Diagnosis not present

## 2022-08-02 DIAGNOSIS — K658 Other peritonitis: Secondary | ICD-10-CM | POA: Diagnosis not present

## 2022-08-02 DIAGNOSIS — J9601 Acute respiratory failure with hypoxia: Secondary | ICD-10-CM | POA: Diagnosis not present

## 2022-08-02 DIAGNOSIS — K55059 Acute (reversible) ischemia of intestine, part and extent unspecified: Secondary | ICD-10-CM | POA: Diagnosis present

## 2022-08-02 DIAGNOSIS — Z452 Encounter for adjustment and management of vascular access device: Secondary | ICD-10-CM | POA: Diagnosis not present

## 2022-08-02 DIAGNOSIS — K922 Gastrointestinal hemorrhage, unspecified: Secondary | ICD-10-CM | POA: Diagnosis not present

## 2022-08-02 DIAGNOSIS — D72829 Elevated white blood cell count, unspecified: Secondary | ICD-10-CM | POA: Diagnosis present

## 2022-08-02 DIAGNOSIS — I70202 Unspecified atherosclerosis of native arteries of extremities, left leg: Secondary | ICD-10-CM | POA: Diagnosis present

## 2022-08-02 DIAGNOSIS — K55021 Focal (segmental) acute infarction of small intestine: Secondary | ICD-10-CM | POA: Diagnosis not present

## 2022-08-02 HISTORY — PX: LAPAROSCOPY: SHX197

## 2022-08-02 LAB — POCT I-STAT 7, (LYTES, BLD GAS, ICA,H+H)
Acid-base deficit: 6 mmol/L — ABNORMAL HIGH (ref 0.0–2.0)
Bicarbonate: 18.2 mmol/L — ABNORMAL LOW (ref 20.0–28.0)
Calcium, Ion: 1.05 mmol/L — ABNORMAL LOW (ref 1.15–1.40)
HCT: 26 % — ABNORMAL LOW (ref 39.0–52.0)
Hemoglobin: 8.8 g/dL — ABNORMAL LOW (ref 13.0–17.0)
O2 Saturation: 100 %
Patient temperature: 35.2
Potassium: 3.6 mmol/L (ref 3.5–5.1)
Sodium: 140 mmol/L (ref 135–145)
TCO2: 19 mmol/L — ABNORMAL LOW (ref 22–32)
pCO2 arterial: 28 mmHg — ABNORMAL LOW (ref 32–48)
pH, Arterial: 7.414 (ref 7.35–7.45)
pO2, Arterial: 396 mmHg — ABNORMAL HIGH (ref 83–108)

## 2022-08-02 LAB — CBC
HCT: 40.1 % (ref 39.0–52.0)
HCT: 28.7 % — ABNORMAL LOW (ref 39.0–52.0)
Hemoglobin: 13 g/dL (ref 13.0–17.0)
Hemoglobin: 9.8 g/dL — ABNORMAL LOW (ref 13.0–17.0)
MCH: 29.5 pg (ref 26.0–34.0)
MCH: 30.1 pg (ref 26.0–34.0)
MCHC: 32.4 g/dL (ref 30.0–36.0)
MCHC: 34.1 g/dL (ref 30.0–36.0)
MCV: 91.1 fL (ref 80.0–100.0)
MCV: 88 fL (ref 80.0–100.0)
Platelets: 100 10*3/uL — ABNORMAL LOW (ref 150–400)
Platelets: 71 10*3/uL — ABNORMAL LOW (ref 150–400)
RBC: 4.4 MIL/uL (ref 4.22–5.81)
RBC: 3.26 MIL/uL — ABNORMAL LOW (ref 4.22–5.81)
RDW: 13 % (ref 11.5–15.5)
RDW: 13.2 % (ref 11.5–15.5)
WBC: 28.3 10*3/uL — ABNORMAL HIGH (ref 4.0–10.5)
WBC: 30.5 10*3/uL — ABNORMAL HIGH (ref 4.0–10.5)
nRBC: 0 % (ref 0.0–0.2)
nRBC: 0 % (ref 0.0–0.2)

## 2022-08-02 LAB — COMPREHENSIVE METABOLIC PANEL
ALT: 12 U/L (ref 0–44)
AST: 24 U/L (ref 15–41)
Albumin: 3 g/dL — ABNORMAL LOW (ref 3.5–5.0)
Alkaline Phosphatase: 27 U/L — ABNORMAL LOW (ref 38–126)
Anion gap: 20 — ABNORMAL HIGH (ref 5–15)
BUN: 27 mg/dL — ABNORMAL HIGH (ref 8–23)
CO2: 16 mmol/L — ABNORMAL LOW (ref 22–32)
Calcium: 7.9 mg/dL — ABNORMAL LOW (ref 8.9–10.3)
Chloride: 102 mmol/L (ref 98–111)
Creatinine, Ser: 1.99 mg/dL — ABNORMAL HIGH (ref 0.61–1.24)
GFR, Estimated: 31 mL/min — ABNORMAL LOW (ref >60)
Glucose, Bld: 62 mg/dL — ABNORMAL LOW (ref 70–99)
Potassium: 4.1 mmol/L (ref 3.5–5.1)
Sodium: 138 mmol/L (ref 135–145)
Total Bilirubin: 1.9 mg/dL — ABNORMAL HIGH (ref 0.3–1.2)
Total Protein: 4.9 g/dL — ABNORMAL LOW (ref 6.5–8.1)

## 2022-08-02 LAB — LACTIC ACID, PLASMA
Lactic Acid, Venous: 7.9 mmol/L (ref 0.5–1.9)
Lactic Acid, Venous: 7.4 mmol/L (ref 0.5–1.9)

## 2022-08-02 LAB — GLUCOSE, CAPILLARY
Glucose-Capillary: 68 mg/dL — ABNORMAL LOW (ref 70–99)
Glucose-Capillary: 78 mg/dL (ref 70–99)
Glucose-Capillary: 92 mg/dL (ref 70–99)

## 2022-08-02 LAB — TYPE AND SCREEN
ABO/RH(D): O POS
Antibody Screen: NEGATIVE

## 2022-08-02 LAB — HEPARIN LEVEL (UNFRACTIONATED): Heparin Unfractionated: >1.1 IU/mL — ABNORMAL HIGH (ref 0.30–0.70)

## 2022-08-02 SURGERY — LAPAROSCOPY, DIAGNOSTIC
Anesthesia: General

## 2022-08-02 MED ORDER — DEXAMETHASONE SODIUM PHOSPHATE 10 MG/ML IJ SOLN
INTRAMUSCULAR | Status: DC | PRN
  Administered 2022-08-02: 4 mg via INTRAVENOUS

## 2022-08-02 MED ORDER — POLYETHYLENE GLYCOL 3350 17 G PO PACK: 17.0000 g | PACK | Freq: Every day | ORAL | Status: DC

## 2022-08-02 MED ORDER — VASOPRESSIN 20 UNIT/ML IV SOLN
INTRAVENOUS | Status: DC | PRN
  Administered 2022-08-02 (×2): 2 [IU] via INTRAVENOUS
  Administered 2022-08-02 (×2): .5 [IU] via INTRAVENOUS

## 2022-08-02 MED ORDER — ROCURONIUM BROMIDE 10 MG/ML (PF) SYRINGE
PREFILLED_SYRINGE | INTRAVENOUS | Status: DC | PRN
  Administered 2022-08-02: 70 mg via INTRAVENOUS
  Administered 2022-08-02: 30 mg via INTRAVENOUS

## 2022-08-02 MED ORDER — PROPOFOL 500 MG/50ML IV EMUL
INTRAVENOUS | Status: DC | PRN
  Administered 2022-08-02: 30 ug/kg/min via INTRAVENOUS

## 2022-08-02 MED ORDER — PROPOFOL 10 MG/ML IV BOLUS
INTRAVENOUS | Status: DC | PRN
  Administered 2022-08-02: 80 mg via INTRAVENOUS

## 2022-08-02 MED ORDER — PHENYLEPHRINE 80 MCG/ML (10ML) SYRINGE FOR IV PUSH (FOR BLOOD PRESSURE SUPPORT)
PREFILLED_SYRINGE | INTRAVENOUS | Status: DC | PRN
  Administered 2022-08-02 (×2): 240 ug via INTRAVENOUS
  Administered 2022-08-02 (×2): 160 ug via INTRAVENOUS

## 2022-08-02 MED ORDER — 0.9 % SODIUM CHLORIDE (POUR BTL) OPTIME
TOPICAL | Status: DC | PRN
  Administered 2022-08-02: 1000 mL

## 2022-08-02 MED ORDER — DEXAMETHASONE SODIUM PHOSPHATE 10 MG/ML IJ SOLN
INTRAMUSCULAR | Status: AC
  Filled 2022-08-02: qty 1

## 2022-08-02 MED ORDER — HEPARIN (PORCINE) 25000 UT/250ML-% IV SOLN
1100.0000 [IU]/h | INTRAVENOUS | Status: AC
Start: 2022-08-02 — End: 2022-08-04
  Administered 2022-08-02: 750 [IU]/h via INTRAVENOUS
  Administered 2022-08-03: 1000 [IU]/h via INTRAVENOUS
  Filled 2022-08-02 (×2): qty 250

## 2022-08-02 MED ORDER — LIDOCAINE 2% (20 MG/ML) 5 ML SYRINGE
INTRAMUSCULAR | Status: AC
  Filled 2022-08-02: qty 5

## 2022-08-02 MED ORDER — LACTATED RINGERS IV SOLN: INTRAVENOUS | Status: DC

## 2022-08-02 MED ORDER — SODIUM BICARBONATE 8.4 % IV SOLN
INTRAVENOUS | Status: AC
  Filled 2022-08-02: qty 50

## 2022-08-02 MED ORDER — CEFAZOLIN SODIUM-DEXTROSE 2-4 GM/100ML-% IV SOLN
INTRAVENOUS | Status: AC
  Filled 2022-08-02: qty 100

## 2022-08-02 MED ORDER — ONDANSETRON HCL 4 MG/2ML IJ SOLN
INTRAMUSCULAR | Status: AC
  Filled 2022-08-02: qty 2

## 2022-08-02 MED ORDER — HEPARIN (PORCINE) 25000 UT/250ML-% IV SOLN: 750.0000 [IU]/h | INTRAVENOUS | Status: DC

## 2022-08-02 MED ORDER — DEXTROSE-NACL 5-0.9 % IV SOLN: INTRAVENOUS | Status: DC

## 2022-08-02 MED ORDER — FENTANYL CITRATE (PF) 250 MCG/5ML IJ SOLN
INTRAMUSCULAR | Status: DC | PRN
  Administered 2022-08-02 (×2): 50 ug via INTRAVENOUS

## 2022-08-02 MED ORDER — PHENYLEPHRINE HCL-NACL 20-0.9 MG/250ML-% IV SOLN
25.0000 ug/min | INTRAVENOUS | Status: DC
  Administered 2022-08-02 – 2022-08-03 (×3): 70 ug/min via INTRAVENOUS
  Administered 2022-08-03: 60 ug/min via INTRAVENOUS
  Administered 2022-08-03: 80 ug/min via INTRAVENOUS
  Administered 2022-08-03: 65 ug/min via INTRAVENOUS
  Administered 2022-08-04: 40 ug/min via INTRAVENOUS
  Administered 2022-08-04: 60 ug/min via INTRAVENOUS
  Filled 2022-08-02 (×7): qty 250

## 2022-08-02 MED ORDER — FENTANYL CITRATE (PF) 100 MCG/2ML IJ SOLN
50.0000 ug | Freq: Once | INTRAMUSCULAR | Status: AC
  Administered 2022-08-02: 50 ug via INTRAVENOUS

## 2022-08-02 MED ORDER — SODIUM CHLORIDE 0.9 % IV SOLN
2.0000 g | Freq: Two times a day (BID) | INTRAVENOUS | Status: DC
  Administered 2022-08-02 – 2022-08-06 (×9): 2 g via INTRAVENOUS
  Filled 2022-08-02 (×11): qty 2

## 2022-08-02 MED ORDER — PANTOPRAZOLE SODIUM 40 MG IV SOLR
40.0000 mg | Freq: Every day | INTRAVENOUS | Status: DC
  Administered 2022-08-02 – 2022-08-05 (×4): 40 mg via INTRAVENOUS
  Filled 2022-08-02 (×4): qty 10

## 2022-08-02 MED ORDER — FENTANYL CITRATE (PF) 250 MCG/5ML IJ SOLN
INTRAMUSCULAR | Status: AC
  Filled 2022-08-02: qty 5

## 2022-08-02 MED ORDER — DOCUSATE SODIUM 50 MG/5ML PO LIQD: 100.0000 mg | Freq: Two times a day (BID) | ORAL | Status: DC

## 2022-08-02 MED ORDER — MIDAZOLAM HCL 2 MG/2ML IJ SOLN: 1.0000 mg | INTRAMUSCULAR | Status: DC | PRN

## 2022-08-02 MED ORDER — LACTATED RINGERS IV SOLN: INTRAVENOUS | Status: DC | PRN

## 2022-08-02 MED ORDER — CHLORHEXIDINE GLUCONATE CLOTH 2 % EX PADS
6.0000 | MEDICATED_PAD | Freq: Every day | CUTANEOUS | Status: DC
Start: 2022-08-02 — End: 2022-08-06
  Administered 2022-08-02 – 2022-08-06 (×8): 6 via TOPICAL

## 2022-08-02 MED ORDER — ONDANSETRON HCL 4 MG/2ML IJ SOLN
INTRAMUSCULAR | Status: DC | PRN
  Administered 2022-08-02: 4 mg via INTRAVENOUS

## 2022-08-02 MED ORDER — CHLORHEXIDINE GLUCONATE 0.12 % MT SOLN: 15.0000 mL | OROMUCOSAL | Status: AC

## 2022-08-02 MED ORDER — SODIUM CHLORIDE 0.9 % IV SOLN
250.0000 mL | INTRAVENOUS | Status: DC
  Administered 2022-08-02 – 2022-08-04 (×2): 250 mL via INTRAVENOUS

## 2022-08-02 MED ORDER — PHENYLEPHRINE HCL-NACL 20-0.9 MG/250ML-% IV SOLN
INTRAVENOUS | Status: DC | PRN
  Administered 2022-08-02: 100 ug/min via INTRAVENOUS

## 2022-08-02 MED ORDER — PROPOFOL 10 MG/ML IV BOLUS
INTRAVENOUS | Status: AC
  Filled 2022-08-02: qty 20

## 2022-08-02 MED ORDER — FENTANYL CITRATE PF 50 MCG/ML IJ SOSY: 25.0000 ug | PREFILLED_SYRINGE | INTRAMUSCULAR | Status: DC | PRN

## 2022-08-02 MED ORDER — ORAL CARE MOUTH RINSE: 15.0000 mL | OROMUCOSAL | Status: DC | PRN

## 2022-08-02 MED ORDER — PHENYLEPHRINE HCL-NACL 20-0.9 MG/250ML-% IV SOLN: 0.0000 ug/min | INTRAVENOUS | Status: DC

## 2022-08-02 MED ORDER — BUPIVACAINE HCL 0.25 % IJ SOLN
INTRAMUSCULAR | Status: DC | PRN
  Administered 2022-08-02: 3 mL

## 2022-08-02 MED ORDER — FENTANYL CITRATE PF 50 MCG/ML IJ SOSY
25.0000 ug | PREFILLED_SYRINGE | INTRAMUSCULAR | Status: DC | PRN
  Administered 2022-08-02 – 2022-08-06 (×9): 50 ug via INTRAVENOUS
  Filled 2022-08-02: qty 2
  Filled 2022-08-02 (×8): qty 1

## 2022-08-02 MED ORDER — LIDOCAINE 2% (20 MG/ML) 5 ML SYRINGE
INTRAMUSCULAR | Status: DC | PRN
  Administered 2022-08-02: 60 mg via INTRAVENOUS

## 2022-08-02 MED ORDER — ALBUMIN HUMAN 5 % IV SOLN: INTRAVENOUS | Status: DC | PRN

## 2022-08-02 MED ORDER — FENTANYL CITRATE (PF) 100 MCG/2ML IJ SOLN
INTRAMUSCULAR | Status: AC
  Filled 2022-08-02: qty 2

## 2022-08-02 MED ORDER — ORAL CARE MOUTH RINSE
15.0000 mL | OROMUCOSAL | Status: DC
Start: 2022-08-02 — End: 2022-08-06
  Administered 2022-08-02 – 2022-08-06 (×46): 15 mL via OROMUCOSAL

## 2022-08-02 MED ORDER — SODIUM CHLORIDE 0.9 % IV SOLN: INTRAVENOUS | Status: DC | PRN

## 2022-08-02 MED ORDER — BUPIVACAINE HCL (PF) 0.25 % IJ SOLN
INTRAMUSCULAR | Status: AC
  Filled 2022-08-02: qty 20

## 2022-08-02 MED ORDER — HEPARIN (PORCINE) 25000 UT/250ML-% IV SOLN
750.0000 [IU]/h | INTRAVENOUS | Status: DC
  Filled 2022-08-02: qty 250

## 2022-08-02 MED ORDER — PROPOFOL 1000 MG/100ML IV EMUL
0.0000 ug/kg/min | INTRAVENOUS | Status: DC
  Administered 2022-08-02: 40 ug/kg/min via INTRAVENOUS
  Administered 2022-08-03: 30 ug/kg/min via INTRAVENOUS
  Administered 2022-08-03: 40 ug/kg/min via INTRAVENOUS
  Administered 2022-08-03: 30 ug/kg/min via INTRAVENOUS
  Administered 2022-08-03: 40 ug/kg/min via INTRAVENOUS
  Administered 2022-08-04 (×2): 30 ug/kg/min via INTRAVENOUS
  Administered 2022-08-05 (×2): 20 ug/kg/min via INTRAVENOUS
  Filled 2022-08-02 (×13): qty 100

## 2022-08-02 MED ORDER — CHLORHEXIDINE GLUCONATE 0.12 % MT SOLN
OROMUCOSAL | Status: AC
  Administered 2022-08-02: 15 mL via OROMUCOSAL
  Filled 2022-08-02: qty 15

## 2022-08-02 MED ORDER — SODIUM BICARBONATE 8.4 % IV SOLN
INTRAVENOUS | Status: DC | PRN
  Administered 2022-08-02 (×2): 50 meq via INTRAVENOUS

## 2022-08-02 MED ORDER — VASOPRESSIN 20 UNIT/ML IV SOLN
INTRAVENOUS | Status: AC
  Filled 2022-08-02: qty 1

## 2022-08-02 SURGICAL SUPPLY — 45 items
BAG COUNTER SPONGE SURGICOUNT (BAG) ×2 IMPLANT
BLADE CLIPPER SURG (BLADE) IMPLANT
CANISTER SUCT 3000ML PPV (MISCELLANEOUS) IMPLANT
CHLORAPREP W/TINT 26 (MISCELLANEOUS) ×2 IMPLANT
COVER SURGICAL LIGHT HANDLE (MISCELLANEOUS) ×2 IMPLANT
DERMABOND ADVANCED (GAUZE/BANDAGES/DRESSINGS) ×2
DERMABOND ADVANCED .7 DNX12 (GAUZE/BANDAGES/DRESSINGS) ×1 IMPLANT
DRAPE WARM FLUID 44X44 (DRAPES) ×2 IMPLANT
ELECT REM PT RETURN 9FT ADLT (ELECTROSURGICAL) ×2
ELECTRODE REM PT RTRN 9FT ADLT (ELECTROSURGICAL) ×1 IMPLANT
GLOVE BIO SURGEON STRL SZ7.5 (GLOVE) ×2 IMPLANT
GLOVE SURG SYN 7.5  E (GLOVE) ×2
GLOVE SURG SYN 7.5 E (GLOVE) ×1 IMPLANT
GLOVE SURG SYN 7.5 PF PI (GLOVE) ×1 IMPLANT
GOWN STRL REUS W/ TWL LRG LVL3 (GOWN DISPOSABLE) ×2 IMPLANT
GOWN STRL REUS W/ TWL XL LVL3 (GOWN DISPOSABLE) ×1 IMPLANT
GOWN STRL REUS W/TWL LRG LVL3 (GOWN DISPOSABLE) ×4
GOWN STRL REUS W/TWL XL LVL3 (GOWN DISPOSABLE) ×2
HANDLE SUCTION POOLE (INSTRUMENTS) IMPLANT
KIT BASIN OR (CUSTOM PROCEDURE TRAY) ×2 IMPLANT
KIT TURNOVER KIT B (KITS) ×2 IMPLANT
NDL INSUFFLATION 14GA 120MM (NEEDLE) ×1 IMPLANT
NEEDLE INSUFFLATION 14GA 120MM (NEEDLE) ×2 IMPLANT
NS IRRIG 1000ML POUR BTL (IV SOLUTION) ×2 IMPLANT
PAD ARMBOARD 7.5X6 YLW CONV (MISCELLANEOUS) ×4 IMPLANT
PENCIL SMOKE EVACUATOR (MISCELLANEOUS) ×1 IMPLANT
RELOAD PROXIMATE 75MM BLUE (ENDOMECHANICALS) ×4 IMPLANT
RELOAD STAPLE 75 3.8 BLU REG (ENDOMECHANICALS) IMPLANT
SCISSORS LAP 5X35 DISP (ENDOMECHANICALS) IMPLANT
SET IRRIG TUBING LAPAROSCOPIC (IRRIGATION / IRRIGATOR) IMPLANT
SET TUBE SMOKE EVAC HIGH FLOW (TUBING) ×2 IMPLANT
SLEEVE ENDOPATH XCEL 5M (ENDOMECHANICALS) ×2 IMPLANT
SPONGE ABDOMINAL VAC ABTHERA (MISCELLANEOUS) ×1 IMPLANT
STAPLER PROXIMATE 75MM BLUE (STAPLE) ×1 IMPLANT
SUCTION POOLE HANDLE (INSTRUMENTS) ×2
SUT MNCRL AB 4-0 PS2 18 (SUTURE) ×2 IMPLANT
TOWEL GREEN STERILE (TOWEL DISPOSABLE) ×2 IMPLANT
TOWEL GREEN STERILE FF (TOWEL DISPOSABLE) ×2 IMPLANT
TRAY FOLEY MTR SLVR 16FR STAT (SET/KITS/TRAYS/PACK) ×1 IMPLANT
TRAY LAPAROSCOPIC MC (CUSTOM PROCEDURE TRAY) ×2 IMPLANT
TROCAR XCEL 12X100 BLDLESS (ENDOMECHANICALS) IMPLANT
TROCAR XCEL BLUNT TIP 100MML (ENDOMECHANICALS) IMPLANT
TROCAR XCEL NON-BLD 11X100MML (ENDOMECHANICALS) IMPLANT
TROCAR Z-THREAD OPTICAL 5X100M (TROCAR) ×2 IMPLANT
WARMER LAPAROSCOPE (MISCELLANEOUS) ×2 IMPLANT

## 2022-08-02 NOTE — Anesthesia Procedure Notes (Signed)
Arterial Line Insertion Start/End8/07/2022 12:10 PM Performed by: Carolan Clines, CRNA, CRNA  Patient location: OR. Preanesthetic checklist: patient identified, IV checked, site marked, risks and benefits discussed, surgical consent, monitors and equipment checked, pre-op evaluation, timeout performed and anesthesia consent Right, radial was placed Catheter size: 20 G Hand hygiene performed  and maximum sterile barriers used   Attempts: 1 Procedure performed without using ultrasound guided technique. Following insertion, dressing applied and Biopatch. Post procedure assessment: normal and unchanged  Patient tolerated the procedure well with no immediate complications.

## 2022-08-02 NOTE — Progress Notes (Addendum)
[facility]  Subjective  - increased abdominal pain over night.  Tolerated a few sips of water.   Objective (!) 108/54 74 98.9 F (37.2 C) (Axillary) 20 99%  Intake/Output Summary (Last 24 hours) at 08/15/2022 0749 Last data filed at 07/30/2022 0415 Gross per 24 hour  Intake 1483.36 ml  Output 600 ml  Net 883.36 ml    No BS to auscultation Abdomin tender in all quadrants Lungs non labored  Alert and oriented Heart Afib irregularly irregular   Assessment/Planning: POD # 1 SMA thrombectomy History of Left LE thrombectomy with known Afib .  Off anticoagulation for 3 years due to GI bleed and bruising  Heparin on hold and General surgery consult placed for consult to r/o ischemia.   Roxy Horseman 08/08/2022 7:49 AM --  Laboratory Lab Results: Recent Labs    08/21/2022 1954 08/24/2022 0314  WBC 20.9* 28.3*  HGB 14.6 13.0  HCT 44.1 40.1  PLT 105* 100*   BMET Recent Labs    07/31/2022 1003 08/10/2022 1032 08/13/2022 1954  NA 134* 136 136  K 2.9* 3.2* 5.0  CL 100 100 105  CO2 19*  --  20*  GLUCOSE 168* 168* 177*  BUN '11 9 13  '$ CREATININE 1.32* 1.10 1.45*  CALCIUM 9.3  --  8.3*    COAG Lab Results  Component Value Date   INR 1.2 07/27/2022   INR 1.0 07/22/2008   No results found for: "PTT"    I have interviewed and examined patient with PA and agree with assessment and plan above. Lactic acid elevated this morning with concern for worsening ischemia.  General surgery has evaluated and plans for diagnostic laparoscopy with possible exploratory laparotomy.  I have updated the son via telephone.  Shandon Matson C. Donzetta Matters, MD [facility] Office: [phone] Pager: [phone]

## 2022-08-02 NOTE — Transfer of Care (Signed)
Immediate Anesthesia Transfer of Care Note  Patient: George Potter  Procedure(s) Performed: LAPAROSCOPY DIAGNOSTIC ,EXPLORATORY LAPAROTOMY, small bowel resection, application of abthera vac  Patient Location: ICU  Anesthesia Type:General  Level of Consciousness: Patient remains intubated per anesthesia plan  Airway & Oxygen Therapy: Patient remains intubated per anesthesia plan and Patient placed on Ventilator (see vital sign flow sheet for setting)  Post-op Assessment: Report given to RN and Post -op Vital signs reviewed and stable  Post vital signs: Reviewed and stable  Last Vitals:  Vitals Value Taken Time  BP 126/71 08/17/2022 1417  Temp    Pulse 81 08/13/2022 1418  Resp 18 08/10/2022 1419  SpO2 100 % 08/19/2022 1418  Vitals shown include unvalidated device data.  Last Pain:  Vitals:   08/09/2022 0834  TempSrc: Oral  PainSc:          Complications: No notable events documented.

## 2022-08-02 NOTE — Progress Notes (Signed)
[place] for IV heparin Indication:  SMA   occlusion s/p thrombectomy  No Known Allergies  Patient Measurements: Height: '5\' 10"'$  (177.8 cm) Weight: 68 kg (149 lb 14.6 oz) IBW/kg (Calculated) : 73 Heparin Dosing Weight: 68 kg  Vital Signs: Temp: 97.9 F (36.6 C) (08/08 0834) Temp Source: Oral (08/08 0834) BP: 99/64 (08/08 0834) Pulse Rate: 87 (08/08 0834)  Labs: Recent Labs    08/11/2022 1003 08/15/2022 1032 08/22/2022 1133 08/20/2022 1144 08/10/2022 1203 07/28/2022 1954 08/10/2022 0314  HGB 16.1 15.6  --   --   --  14.6 13.0  HCT 47.7 46.0  --   --   --  44.1 40.1  PLT 136*  --   --   --   --  105* 100*  APTT  --   --   --  34  --   --   --   LABPROT  --   --  14.6  --   --   --   --   INR  --   --  1.2  --   --   --   --   HEPARINUNFRC  --   --   --  <0.10*  --   --  >1.10*  CREATININE 1.32* 1.10  --   --   --  1.45*  --   TROPONINIHS 6  --   --   --  8  --   --      Estimated Creatinine Clearance: 30 mL/min (A) (by C-G formula based on SCr of 1.45 mg/dL (H)).   Assessment: Pharmacy is consulted to dose heparin in 88 yo male diagnosed with SMA occlusion.  Per med rec, Pt was on Eliquis previously for afib but had stopped taking the med about 3 years ago due to rectal bleeding and bruising (baseline aPTT 34 sec).  PMH includes Ischemic left leg that required Iliofemoral thromboembolectomy in 2019.    S/p mechanical thrombectomy 8/7, now s/p exploratory laparotomy with small bowel resection and wound VAC placement by surgery on 8/8. Surgery okay with restarting heparin post-op without bolus. Hgb 13, plt 100.   Goal of Therapy:  Heparin level 0.3-0.7 units/ml Monitor platelets by anticoagulation protocol: Yes   Plan:  Restart heparin infusion at 750 units/hr Obtain heparin level 8 hours after re-start of infusion F/u long-term AC plans  Antonietta Jewel, PharmD, BCCCP Clinical Pharmacist  Phone: [phone] 08/14/2022 3:24 PM  Please  check AMION for all [facility] phone numbers After 10:00 PM, call [place] [phone]

## 2022-08-02 NOTE — Progress Notes (Signed)
ANTICOAGULATION CONSULT NOTE - Follow Up Consult  Pharmacy Consult for heparin Indication:  SMA occlusion now s/p thrombectomy  Labs: Recent Labs    08/06/2022 1003 08/03/2022 1032 07/31/2022 1133 07/26/2022 1144 08/19/2022 1203 08/19/2022 1954 08/09/2022 0314  HGB 16.1 15.6  --   --   --  14.6 13.0  HCT 47.7 46.0  --   --   --  44.1 40.1  PLT 136*  --   --   --   --  105* 100*  APTT  --   --   --  34  --   --   --   LABPROT  --   --  14.6  --   --   --   --   INR  --   --  1.2  --   --   --   --   HEPARINUNFRC  --   --   --  <0.10*  --   --  >1.10*  CREATININE 1.32* 1.10  --   --   --  1.45*  --   TROPONINIHS 6  --   --   --  8  --   --     Assessment: 88yo male supratherapeutic on heparin with initial dosing s/p thrombectomy; no infusion issues per RN but she does note blood in stools, last ~2200.  Goal of Therapy:  Heparin level 0.3-0.7 units/ml   Plan:  Will hold heparin x1h then resume at decreased rate of 750 units/hr and check level in 8 hours.    Wynona Neat, PharmD, BCPS  08/16/2022,4:40 AM

## 2022-08-02 NOTE — Plan of Care (Signed)

## 2022-08-02 NOTE — H&P (Addendum)
NAME:  George Potter, MRN:  [id number], DOB:  1926-08-27, LOS: 0 ADMISSION DATE:  08/02/2022, CONSULTATION DATE:  08/25/2022 REFERRING MD:  Laneta Simmers, MD, CHIEF COMPLAINT:  abd pain   History of Present Illness:  Patient is a 88 year old male with a hx chronic  mesenteric ischemia and afib (not on AC past 3 years due to rectal bleeding and bruising) and PAD with prior ischemic left leg (required iliofemoral thrombectomy by Dr. Trula Slade 10/14/2018) and HTN presented 8/7 to [facility] with complaints of abdominal pain onset 30 minutes after eating honey bun and coffee. Pain most severe RLQ. Found to have acute occlusion of SMA and was started on heparin and transferred to [facility] for vascular surgery evaluation. He underwent percutaneous thrombectomy of SMA via R femoral on 8/7 with initial symptom improvement. Pain acutely worsened overnight and became constant. Pt noted blood in his stool and inability to move his bowels.   Pertinent  Medical History   Past Medical History:  Diagnosis Date   Atrial fibrillation, chronic (HCC)    a. on Eliquis   Diverticulosis    HTN (hypertension)    Mild   Severe mitral regurgitation      Significant Hospital Events: Including procedures, antibiotic start and stop dates in addition to other pertinent events   8/7 SMA thrombectomy 8/8 ex-lap of necrotic bowel  Interim History / Subjective:  Bloody BM, inability to pass stool. Worsening abd pain. S/p ex-lap this morning. Currently intubated and sedated.  Objective   Blood pressure 99/64, pulse 87, temperature 97.9 F (36.6 C), temperature source Oral, resp. rate 18, height '5\' 10"'$  (1.778 m), weight 68 kg, SpO2 97 %.        Intake/Output Summary (Last 24 hours) at 08/16/2022 1355 Last data filed at 08/14/2022 1322 Gross per 24 hour  Intake 3733.36 ml  Output 625 ml  Net 3108.36 ml   Filed Weights   08/14/2022 0955 08/15/2022 0834  Weight: 68 kg 68 kg    Examination: General: Elderly  male, sedated HENT: intubated Lungs: CTAB, no WRR Cardiovascular: RRR, no MRG, cool distal extremities. Soft pedal pulses Abdomen: s/p ex-lap with wound  vac in place, minimal serosanguinous output. Soft Extremities: cool, no obvious deformities Neuro: sedated, does not rouse to  GU: foley cath  Resolved Hospital Problem list     Assessment & Plan:   Distributive shock due to systemic inflammation in the setting of bowel necrosis Evidence of end organ dysfunction. Requiring blood pressure support with phenylephrine.  Sedated with Versed, propfol and fentanyl. Mechanically ventilated s/p sgx. Maintain deeper sedation with intention on return to OR later this week.  - Continue phenylephrine - Gentle IVF - blood pressure monitoring with art line - Continue Versed propofol, and fentanyl - cardiac monitoring  Acute on chronic mesenteric ischemia/necrosis Acute SMA thrombosis Underwent thrombectomy 8/7. Developed post-operative pain. Underwent ex-lap with resection of necrotic bowel. Bowel has been left open for re-exploration later this week. Wound vac in place with serosanguinous output. Lactic acidosis and leukocytosis likely due to necrotic bowel.  - repeat lactic to ensure down trend - restart heparin infusion - PPI - NPO - gastric tube - daily CBC - fentanyl for pain control  AKI Baseline less than 1. Organ dysfunction likely related to systemic inflammatory response in the setting of bowel necrosis.  Anticipate improvement with resolution of systemic inflammation. - avoid nephrotoxic medications - daily BMP  Afib Not on Eliquis due to rectal bleeding and bruising.  Heart rate currently controlled. - telemetry   PAD Hx ischemic left limb s/p iliofemoral thrombectomy 2019. Lower extremities cool with difficult to palpate pulses.  - ABIs  HLD   Best Practice (right click and "Reselect all SmartList Selections" daily)   Diet/type: NPO DVT prophylaxis: SCD GI  prophylaxis: PPI Lines: Arterial Line Foley:  Yes, and it is still needed Code Status:  full code Last date of multidisciplinary goals of care discussion '[]'$   Labs   CBC: Recent Labs  Lab 08/18/2022 1003 08/13/2022 1032 07/28/2022 1954 08/12/2022 0314  WBC 6.8  --  20.9* 28.3*  NEUTROABS 3.5  --   --   --   HGB 16.1 15.6 14.6 13.0  HCT 47.7 46.0 44.1 40.1  MCV 86.4  --  87.5 91.1  PLT 136*  --  105* 100*    Basic Metabolic Panel: Recent Labs  Lab 08/24/2022 1003 08/19/2022 1032 08/09/2022 1954  NA 134* 136 136  K 2.9* 3.2* 5.0  CL 100 100 105  CO2 19*  --  20*  GLUCOSE 168* 168* 177*  BUN '11 9 13  '$ CREATININE 1.32* 1.10 1.45*  CALCIUM 9.3  --  8.3*   GFR: Estimated Creatinine Clearance: 30 mL/min (A) (by C-G formula based on SCr of 1.45 mg/dL (H)). Recent Labs  Lab 08/14/2022 1003 08/06/2022 1203 07/31/2022 1954 08/19/2022 1955 08/06/2022 0314 08/10/2022 0755  WBC 6.8  --  20.9*  --  28.3*  --   LATICACIDVEN 5.6* 6.9*  --  5.4*  --  7.9*    Liver Function Tests: Recent Labs  Lab 08/18/2022 1003 08/10/2022 1954  AST 23 50*  ALT 19 18  ALKPHOS 77 61  BILITOT 1.8* 2.0*  PROT 8.1 6.3*  ALBUMIN 4.3 3.4*   Recent Labs  Lab 07/31/2022 1003  LIPASE 39   No results for input(s): "AMMONIA" in the last 168 hours.  ABG    Component Value Date/Time   TCO2 18 (L) 07/27/2022 1032     Coagulation Profile: Recent Labs  Lab 08/24/2022 1133  INR 1.2    Cardiac Enzymes: No results for input(s): "CKTOTAL", "CKMB", "CKMBINDEX", "TROPONINI" in the last 168 hours.  HbA1C: No results found for: "HGBA1C"  CBG: No results for input(s): "GLUCAP" in the last 168 hours.  Review of Systems:     Past Medical History:  He,  has a past medical history of Atrial fibrillation, chronic ([place]), Diverticulosis, HTN (hypertension), and Severe mitral regurgitation.   Surgical History:   Past Surgical History:  Procedure Laterality Date   BACK SURGERY     Partial facetectomy and decompression  L4-L5   CATARACT EXTRACTION Right 2001   HERNIA REPAIR Right 07/25/2008   Right inguinal herniorrhaphy using a large polypropylene   INTRAOCULAR LENS INSERTION Right 2001   NASAL SINUS SURGERY     multiple surgeries   PERIPHERAL VASCULAR THROMBECTOMY N/A 08/18/2022   Procedure: PERIPHERAL VASCULAR THROMBECTOMY;  Surgeon: Waynetta Sandy, MD;  Location: [place] [facility];  Service: Cardiovascular;  Laterality: N/A;   THROMBECTOMY FEMORAL ARTERY Left 10/14/2018   Procedure: Iliofemoral Thromboembolectomy Left Leg;  Surgeon: Serafina Mitchell, MD;  Location: [place];  Service: Vascular;  Laterality: Left;   VISCERAL ANGIOGRAPHY N/A 08/18/2022   Procedure: VISCERAL ANGIOGRAPHY;  Surgeon: Waynetta Sandy, MD;  Location: [place] [facility];  Service: Cardiovascular;  Laterality: N/A;     Social History:   reports that he has never smoked. He has never used smokeless tobacco.  Family History:  His family history is negative for Atrial fibrillation.   Allergies No Known Allergies   Home Medications  Prior to Admission medications   Medication Sig Start Date End Date Taking? Authorizing Provider  atorvastatin (LIPITOR) 40 MG tablet Take 40 mg by mouth every morning.   Yes [provider]  bismuth subsalicylate (PEPTO BISMOL) 262 MG/15ML suspension Take 30 mLs by mouth every 6 (six) hours as needed (acid reflux).   Yes [provider]  Cholecalciferol (VITAMIN D3) 25 MCG (1000 UT) CAPS Take 1,000 Units by mouth every morning.   Yes [provider]  ferrous sulfate 325 (65 FE) MG tablet Take 325 mg by mouth every morning.   Yes [provider]  metoprolol succinate (TOPROL-XL) 25 MG 24 hr tablet Take 1 tablet (25 mg total) by mouth daily. 10/18/18  Yes Dagoberto Ligas, PA-C  naproxen sodium (ALEVE) 220 MG tablet Take 220 mg by mouth daily as needed (pain).   Yes [provider]  omeprazole (PRILOSEC) 20 MG capsule Take 20 mg by mouth  daily as needed for indigestion. 07/04/22  Yes [provider]  polyvinyl alcohol-povidone (REFRESH) 1.4-0.6 % ophthalmic solution Place 1 drop into both eyes daily as needed (for dry eyes).   Yes [provider]  Potassium 99 MG TABS Take 99 mg by mouth every morning.   Yes [provider]  apixaban (ELIQUIS) 2.5 MG TABS tablet Take 2.5 mg by mouth 2 (two) times daily. Patient not taking: Reported on 08/13/2022    [provider]     Critical care time: Hansen, MD

## 2022-08-02 NOTE — Op Note (Signed)
07/31/2022  1:06 PM  PATIENT:  George Potter  88 y.o. male  PRE-OPERATIVE DIAGNOSIS:  small bowel ischemia  POST-OPERATIVE DIAGNOSIS:  small bowel ischemia  PROCEDURE:  Procedure(s): LAPAROSCOPY DIAGNOSTIC ,EXPLORATORY LAPAROTOMY, small bowel resection, application of abthera vac (N/A)  SURGEON:  Surgeon(s) and Role:    Ralene Ok, MD - Primary  PHYSICIAN ASSISTANT: Melina Modena, PA-C  ANESTHESIA:   local and general  EBL:  minimal   BLOOD ADMINISTERED:none  DRAINS: none   LOCAL MEDICATIONS USED:  MARCAINE     SPECIMEN:  Source of Specimen:  ischemic small bowel  DISPOSITION OF SPECIMEN:  PATHOLOGY  COUNTS:  YES  TOURNIQUET:  * No tourniquets in log *  DICTATION: .Dragon Dictation Procedure: Patient is a 88 year old male who underwent SMA thrombectomy on admission.  Patient was admitted thereafter.  Patient was found to have abdominal pain with increasing lactate.  Patient was taken back to the operating room emergently for diagnostic laparoscopy. Findings: Patient had 1 segment of mid small bowel that was ischemic.  The proximal and distal portions of this were marginal however they were nonischemic.  At this point I decided to excise the portion of small bowel of concern and leave him in discontinuity.  His abdomen was left open with plan for second look in 24 to 48 hours.  Details of procedure: After the patient was consented he was taken back to the OR placed in supine position with bilateral SCDs in place.  He underwent general endotracheal anesthesia.  He was then prepped and draped in standard fashion.  A timeout was called and all facts were verified.  At this time a Veress needle technique was used to insufflate the abdomen to 15 mmHg in the right lower quadrant.  Subsequent to this 5 mm trocar and camera placed intra-abdominal he.  There is no injury to any intra-abdominal organs. A second 5 mm trocar was placed in the right upper quadrant.  At this time I  was able to mobilize the omentum.  There was a easily visualized portion of ischemic bowel.  At this time proceeded to laparotomy.  10.  Blade was used to anchor midline incision.  Cautery to maintain hemostasis and dissection was taken down to the linea alba.  This was incised.  The peritoneum was entered bluntly.  At this time I extended the fascial incision to the length of the skin incision.  This time I was able to eviscerate the bowel.  The area of concern was visualized.  I ran the small bowel from the ligament of Treitz distally to the terminal ileum.  There was an area in distal to the overt ischemic bowel that appeared to be marginal.  This area was thought to be incorporated into the resection area.  At this time a window was made and a 37 GIA stapler was used to transect the proximal and distal ends of the compromised bowel.  At this time a Doppler was used to hear pulse in the mesentery.  This was confirmed.  At this time a ABThera wound VAC was placed into the abdomen.  This was secured.  Patient tolerated procedure.  He was taken intubated in critical condition to the ICU.  Upon entering the abdomen (organ space), I encountered infection of the small bowel .  CASE DATA:  Type of patient?: DOW CASE (Surgical Hospitalist [facility])  Status of Case? EMERGENT Add On  Infection Present At Time Of Surgery (PATOS)?   Ischemic small bowel  PLAN OF CARE: Admit to inpatient   PATIENT DISPOSITION:  ICU - intubated and critically ill.   Delay start of Pharmacological VTE agent (>24hrs) due to surgical blood loss or risk of bleeding: no

## 2022-08-02 NOTE — Anesthesia Procedure Notes (Signed)
Procedure Name: Intubation Date/Time: 08/19/2022 11:57 AM  Performed by: Bryson Corona, CRNAPre-anesthesia Checklist: Patient identified, Emergency Drugs available, Suction available and Patient being monitored Patient Re-evaluated:Patient Re-evaluated prior to induction Oxygen Delivery Method: Circle System Utilized Preoxygenation: Pre-oxygenation with 100% oxygen Induction Type: IV induction Ventilation: Mask ventilation without difficulty Laryngoscope Size: Glidescope and 3 Grade View: Grade I Tube type: Oral Tube size: 7.0 mm Number of attempts: 1 Airway Equipment and Method: Stylet and Oral airway Placement Confirmation: ETT inserted through vocal cords under direct vision, positive ETCO2 and breath sounds checked- equal and bilateral Secured at: 22 cm Tube secured with: Tape Dental Injury: Teeth and Oropharynx as per pre-operative assessment

## 2022-08-02 NOTE — Consult Note (Signed)
George Potter 07-Nov-1926  [id number].    Requesting MD: Donzetta Matters, MD Chief Complaint/Reason for Consult:    HPI:  George Potter is a pleasant 88 year old male with a past medical history of atrial fibrillation and hypertension who presented to [facility] emergency department with abdominal pain and was subsequently diagnosed with acute mesenteric ischemia with a distal SMA thrombus.  He was started on heparin and underwent percutaneous thrombectomy yesterday 07/29/2022 by Dr. Servando Snare via right femoral approach.  Since this procedure he reports worsening abdominal pain, with sharp and cramping exacerbations.  He states that at first the pain was okay when he was lying still but now it is constant.  He also reports blood in his stool.  He has not had a bowel movement today.  He denies nausea or vomiting.  At baseline he lives independently and mobilizes using an assistive device.  He denies tobacco use.  He has a history of right inguinal hernia repair with mesh   ROS: As above Review of Systems  All other systems reviewed and are negative.   Family History  Problem Relation Age of Onset   Atrial fibrillation Neg Hx     Past Medical History:  Diagnosis Date   Atrial fibrillation, chronic ([place])    a. on Eliquis   Diverticulosis    HTN (hypertension)    Mild   Severe mitral regurgitation     Past Surgical History:  Procedure Laterality Date   BACK SURGERY     Partial facetectomy and decompression L4-L5   CATARACT EXTRACTION Right 2001   HERNIA REPAIR Right 07/25/2008   Right inguinal herniorrhaphy using a large polypropylene   INTRAOCULAR LENS INSERTION Right 2001   NASAL SINUS SURGERY     multiple surgeries   PERIPHERAL VASCULAR THROMBECTOMY N/A 07/27/2022   Procedure: PERIPHERAL VASCULAR THROMBECTOMY;  Surgeon: Waynetta Sandy, MD;  Location: [facility];  Service: Cardiovascular;  Laterality: N/A;   THROMBECTOMY FEMORAL ARTERY Left 10/14/2018    Procedure: Iliofemoral Thromboembolectomy Left Leg;  Surgeon: Serafina Mitchell, MD;  Location: [place];  Service: Vascular;  Laterality: Left;   VISCERAL ANGIOGRAPHY N/A 08/08/2022   Procedure: VISCERAL ANGIOGRAPHY;  Surgeon: Waynetta Sandy, MD;  Location: [facility];  Service: Cardiovascular;  Laterality: N/A;    Social History:  reports that he has never smoked. He has never used smokeless tobacco. No history on file for alcohol use and drug use.  Allergies: No Known Allergies  Medications Prior to Admission  Medication Sig Dispense Refill   atorvastatin (LIPITOR) 40 MG tablet Take 40 mg by mouth every morning.     bismuth subsalicylate (PEPTO BISMOL) 262 MG/15ML suspension Take 30 mLs by mouth every 6 (six) hours as needed (acid reflux).     Cholecalciferol (VITAMIN D3) 25 MCG (1000 UT) CAPS Take 1,000 Units by mouth every morning.     ferrous sulfate 325 (65 FE) MG tablet Take 325 mg by mouth every morning.     metoprolol succinate (TOPROL-XL) 25 MG 24 hr tablet Take 1 tablet (25 mg total) by mouth daily. 30 tablet 3   naproxen sodium (ALEVE) 220 MG tablet Take 220 mg by mouth daily as needed (pain).     omeprazole (PRILOSEC) 20 MG capsule Take 20 mg by mouth daily as needed for indigestion.     polyvinyl alcohol-povidone (REFRESH) 1.4-0.6 % ophthalmic solution Place 1 drop into both eyes daily as needed (for dry eyes).  Potassium 99 MG TABS Take 99 mg by mouth every morning.     apixaban (ELIQUIS) 2.5 MG TABS tablet Take 2.5 mg by mouth 2 (two) times daily. (Patient not taking: Reported on 08/03/2022)       Physical Exam: Blood pressure (!) 108/54, pulse 74, temperature 98.9 F (37.2 C), temperature source Axillary, resp. rate 20, height '5\' 10"'$  (1.778 m), weight 68 kg, SpO2 99 %. General: Pleasant white male  laying on hospital bed, appears stated age, NAD. HEENT: head -normocephalic, atraumatic; Eyes: Pupils are equal, anicteric sclerae Neck- Trachea is midline, no  thyromegaly CV-irregular rhythm, regular rate 84 bpm, no lower extremity edema Pulm- breathing is non-labored. CTABL, no wheezes, rhales, rhonchi. Abd- soft, globally tender to light palpation with rebound tenderness, no hernias, right femoral dressing clean and dry GU- deferred  MSK- UE/LE symmetrical, no cyanosis, clubbing, or edema. Neuro- CN II-XII grossly in tact, no paresthesias. Psych- Alert and Oriented x3 with appropriate affect Skin: warm and dry, no rashes or lesions   Results for orders placed or performed during the hospital encounter of 08/12/2022 (from the past 48 hour(s))  Comprehensive metabolic panel     Status: Abnormal   Collection Time: 08/11/2022 10:03 AM  Result Value Ref Range   Sodium 134 (L) 135 - 145 mmol/L   Potassium 2.9 (L) 3.5 - 5.1 mmol/L   Chloride 100 98 - 111 mmol/L   CO2 19 (L) 22 - 32 mmol/L   Glucose, Bld 168 (H) 70 - 99 mg/dL    Comment: Glucose reference range applies only to samples taken after fasting for at least 8 hours.   BUN 11 8 - 23 mg/dL   Creatinine, Ser 1.32 (H) 0.61 - 1.24 mg/dL   Calcium 9.3 8.9 - 10.3 mg/dL   Total Protein 8.1 6.5 - 8.1 g/dL   Albumin 4.3 3.5 - 5.0 g/dL   AST 23 15 - 41 U/L   ALT 19 0 - 44 U/L   Alkaline Phosphatase 77 38 - 126 U/L   Total Bilirubin 1.8 (H) 0.3 - 1.2 mg/dL   GFR, Estimated 50 (L) >60 mL/min    Comment: (NOTE) Calculated using the CKD-EPI Creatinine Equation (2021)    Anion gap 15 5 - 15    Comment: Performed at [facility], [place] [address] [place], [place] [address]  Lactic acid, plasma     Status: Abnormal   Collection Time: 08/20/2022 10:03 AM  Result Value Ref Range   Lactic Acid, Venous 5.6 (HH) 0.5 - 1.9 mmol/L    Comment: CRITICAL RESULT CALLED TO, READ BACK BY AND VERIFIED WITH DOWD,P. RN AT 1106 08/17/2022 MULLINS,T Performed at [facility], [place] [address] [place], [place] [address]   Lipase, blood     Status: None   Collection Time: 08/06/2022  10:03 AM  Result Value Ref Range   Lipase 39 11 - 51 U/L    Comment: Performed at [facility], [place] [address], [place], [place] [address]  Troponin I (High Sensitivity)     Status: None   Collection Time: 08/13/2022 10:03 AM  Result Value Ref Range   Troponin I (High Sensitivity) 6 <18 ng/L    Comment: (NOTE) Elevated high sensitivity troponin I (hsTnI) values and significant  changes across serial measurements may suggest ACS but many other  chronic and acute conditions are known to elevate hsTnI results.  Refer to the "Links" section for chest pain algorithms and additional  guidance. Performed  at [facility], [place] [address] [place], [place] [address]   CBC with Differential     Status: Abnormal   Collection Time: 07/31/2022 10:03 AM  Result Value Ref Range   WBC 6.8 4.0 - 10.5 K/uL   RBC 5.52 4.22 - 5.81 MIL/uL   Hemoglobin 16.1 13.0 - 17.0 g/dL   HCT 47.7 39.0 - 52.0 %   MCV 86.4 80.0 - 100.0 fL   MCH 29.2 26.0 - 34.0 pg   MCHC 33.8 30.0 - 36.0 g/dL   RDW 12.8 11.5 - 15.5 %   Platelets 136 (L) 150 - 400 K/uL   nRBC 0.0 0.0 - 0.2 %   Neutrophils Relative % 51 %   Neutro Abs 3.5 1.7 - 7.7 K/uL   Lymphocytes Relative 38 %   Lymphs Abs 2.6 0.7 - 4.0 K/uL   Monocytes Relative 9 %   Monocytes Absolute 0.6 0.1 - 1.0 K/uL   Eosinophils Relative 0 %   Eosinophils Absolute 0.0 0.0 - 0.5 K/uL   Basophils Relative 0 %   Basophils Absolute 0.0 0.0 - 0.1 K/uL   Immature Granulocytes 2 %   Abs Immature Granulocytes 0.11 (H) 0.00 - 0.07 K/uL    Comment: Performed at [facility], [place] [address], [place], [place] [address]  Ginger Carne 8, ED     Status: Abnormal   Collection Time: 08/17/2022 10:32 AM  Result Value Ref Range   Sodium 136 135 - 145 mmol/L   Potassium 3.2 (L) 3.5 - 5.1 mmol/L   Chloride 100 98 - 111 mmol/L   BUN 9 8 - 23 mg/dL   Creatinine, Ser 1.10 0.61 - 1.24 mg/dL   Glucose, Bld 168 (H) 70 - 99 mg/dL     Comment: Glucose reference range applies only to samples taken after fasting for at least 8 hours.   Calcium, Ion 1.05 (L) 1.15 - 1.40 mmol/L   TCO2 18 (L) 22 - 32 mmol/L   Hemoglobin 15.6 13.0 - 17.0 g/dL   HCT 46.0 39.0 - 52.0 %  Protime-INR     Status: None   Collection Time: 08/23/2022 11:33 AM  Result Value Ref Range   Prothrombin Time 14.6 11.4 - 15.2 seconds   INR 1.2 0.8 - 1.2    Comment: (NOTE) INR goal varies based on device and disease states. Performed at [facility], [place] [address] [place], [place] [address]   Type and screen [place]     Status: None   Collection Time: 07/31/2022 11:42 AM  Result Value Ref Range   ABO/RH(D) O POS    Antibody Screen NEG    Sample Expiration      08/11/2022,2359 Performed at [facility], [place] [address], [place], [place] [address]   Heparin level (unfractionated)     Status: Abnormal   Collection Time: 07/26/2022 11:44 AM  Result Value Ref Range   Heparin Unfractionated <0.10 (L) 0.30 - 0.70 IU/mL    Comment: (NOTE) The clinical reportable range upper limit is being lowered to >1.10 to align with the FDA approved guidance for the current laboratory assay.  If heparin results are below expected values, and patient dosage has  been confirmed, suggest follow up testing of antithrombin III levels. Performed at [facility], [place] [address], [place], [place] [address]   APTT     Status: None   Collection Time: 08/21/2022 11:44 AM  Result Value Ref Range  aPTT 34 24 - 36 seconds    Comment: Performed at [facility], [place] [address] [place], [place] [address]  Lactic acid, plasma     Status: Abnormal   Collection Time: 07/30/2022 12:03 PM  Result Value Ref Range   Lactic Acid, Venous 6.9 (HH) 0.5 - 1.9 mmol/L    Comment: CRITICAL VALUE NOTED. VALUE IS CONSISTENT WITH PREVIOUSLY REPORTED/CALLED VALUE Performed at [place] [address], [place], [place] [address]   Troponin I (High Sensitivity)     Status: None   Collection Time: 08/08/2022 12:03 PM  Result Value Ref Range   Troponin I (High Sensitivity) 8 <18 ng/L    Comment: (NOTE) Elevated high sensitivity troponin I (hsTnI) values and significant  changes across serial measurements may suggest ACS but many other  chronic and acute conditions are known to elevate hsTnI results.  Refer to the "Links" section for chest pain algorithms and additional  guidance. Performed at [facility], [place] [address], [place], [place] [address]   POCT Activated clotting time     Status: None   Collection Time: 08/11/2022  3:42 PM  Result Value Ref Range   Activated Clotting Time 348 seconds    Comment: Reference range 74-137 seconds for patients not on anticoagulant therapy.  CBC     Status: Abnormal   Collection Time: 08/23/2022  7:54 PM  Result Value Ref Range   WBC 20.9 (H) 4.0 - 10.5 K/uL   RBC 5.04 4.22 - 5.81 MIL/uL   Hemoglobin 14.6 13.0 - 17.0 g/dL   HCT 44.1 39.0 - 52.0 %   MCV 87.5 80.0 - 100.0 fL   MCH 29.0 26.0 - 34.0 pg   MCHC 33.1 30.0 - 36.0 g/dL   RDW 13.0 11.5 - 15.5 %   Platelets 105 (L) 150 - 400 K/uL    Comment: REPEATED TO VERIFY   nRBC 0.0 0.0 - 0.2 %    Comment: Performed at [facility], [place] [address] [place], [place] [address]  Comprehensive metabolic panel     Status: Abnormal   Collection Time: 07/27/2022  7:54 PM  Result Value Ref Range   Sodium 136 135 - 145 mmol/L   Potassium 5.0 3.5 - 5.1 mmol/L    Comment: DELTA CHECK NOTED   Chloride 105 98 - 111 mmol/L   CO2 20 (L) 22 - 32 mmol/L   Glucose, Bld 177 (H) 70 - 99 mg/dL    Comment: Glucose reference range applies only to samples taken after fasting for at least 8 hours.   BUN 13 8 - 23 mg/dL   Creatinine, Ser 1.45 (H) 0.61 - 1.24 mg/dL   Calcium 8.3 (L) 8.9 - 10.3 mg/dL   Total Protein 6.3 (L) 6.5 - 8.1 g/dL   Albumin 3.4 (L) 3.5 - 5.0 g/dL    AST 50 (H) 15 - 41 U/L   ALT 18 0 - 44 U/L   Alkaline Phosphatase 61 38 - 126 U/L   Total Bilirubin 2.0 (H) 0.3 - 1.2 mg/dL   GFR, Estimated 45 (L) >60 mL/min    Comment: (NOTE) Calculated using the CKD-EPI Creatinine Equation (2021)    Anion gap 11 5 - 15    Comment: Performed at [facility], [place] [address] [place], [place] [address]  Lactic acid, plasma     Status: Abnormal   Collection Time: 08/23/2022  7:55 PM  Result Value Ref Range   Lactic  Acid, Venous 5.4 (HH) 0.5 - 1.9 mmol/L    Comment: CRITICAL VALUE NOTED. VALUE IS CONSISTENT WITH PREVIOUSLY REPORTED/CALLED VALUE Performed at [facility], [place] [address] [place], [place] [address]   Urinalysis, Routine w reflex microscopic Urine, Clean Catch     Status: Abnormal   Collection Time: 08/19/2022 11:20 PM  Result Value Ref Range   Color, Urine YELLOW YELLOW   APPearance CLEAR CLEAR   Specific Gravity, Urine >1.046 (H) 1.005 - 1.030   pH 5.0 5.0 - 8.0   Glucose, UA NEGATIVE NEGATIVE mg/dL   Hgb urine dipstick SMALL (A) NEGATIVE   Bilirubin Urine NEGATIVE NEGATIVE   Ketones, ur 5 (A) NEGATIVE mg/dL   Protein, ur 30 (A) NEGATIVE mg/dL   Nitrite NEGATIVE NEGATIVE   Leukocytes,Ua NEGATIVE NEGATIVE   RBC / HPF 0-5 0 - 5 RBC/hpf   WBC, UA 0-5 0 - 5 WBC/hpf   Bacteria, UA NONE SEEN NONE SEEN   Mucus PRESENT     Comment: Performed at [facility], [address] [place], [place] [address]  Heparin level (unfractionated)     Status: Abnormal   Collection Time: 08/08/2022  3:14 AM  Result Value Ref Range   Heparin Unfractionated >1.10 (H) 0.30 - 0.70 IU/mL    Comment: (NOTE) The clinical reportable range upper limit is being lowered to >1.10 to align with the FDA approved guidance for the current laboratory assay.  If heparin results are below expected values, and patient dosage has  been confirmed, suggest follow up testing of antithrombin III levels. Performed at [facility], [place] [address] [place], [place] [address]   CBC     Status: Abnormal   Collection Time: 08/10/2022  3:14 AM  Result Value Ref Range   WBC 28.3 (H) 4.0 - 10.5 K/uL   RBC 4.40 4.22 - 5.81 MIL/uL   Hemoglobin 13.0 13.0 - 17.0 g/dL   HCT 40.1 39.0 - 52.0 %   MCV 91.1 80.0 - 100.0 fL   MCH 29.5 26.0 - 34.0 pg   MCHC 32.4 30.0 - 36.0 g/dL   RDW 13.0 11.5 - 15.5 %   Platelets 100 (L) 150 - 400 K/uL    Comment: Immature Platelet Fraction may be clinically indicated, consider ordering this additional test [id number] REPEATED TO VERIFY    nRBC 0.0 0.0 - 0.2 %    Comment: Performed at [facility], [place] [address], [place], [place] [address]   PERIPHERAL VASCULAR CATHETERIZATION  Result Date: 08/21/2022 Images from the original result were not included. Patient name: George Potter MRN: [id number] DOB: 27-Jan-1926 Sex: male 08/20/2022 Pre-operative Diagnosis: Acute mesenteric ischemia Post-operative diagnosis:  Same Surgeon:  Erlene Quan C. Donzetta Matters, MD Procedure Performed: 1.  Ultrasound-guided cannulation right common femoral artery 2.  Aortogram 3.  Selection superior mesenteric artery 4.  Percutaneous mechanical thrombectomy superior mesenteric artery with CAT 7 lightning penumbra 5.  Mynx device closure right common femoral artery 6.  Moderate sedation with fentanyl and Versed for 44 minutes Indications: 88 year old male presenting with acute mesenteric ischemia today with distal SMA thrombus and is now indicated for angiography with possible mechanical thrombectomy. Findings: There was distal thrombus in the SMA this was aspirated at completion there was much improved flow distally in the SMA with illumination of multiple distal branches.  There was visible thrombus within the canister at completion.  Procedure:  The patient was identified in the holding area and taken to room 8.  The patient was then placed supine on the table and prepped and draped in the usual sterile fashion.  A time out was called.  Ultrasound was used  to evaluate the right common femoral artery which was noted be patent and compressible.  The area was anesthetized with 1% lidocaine cannulated with micropuncture needle followed by wire sheath.  Images saved the permanent record.  Concomitantly moderate sedation was administered with fentanyl and Versed and his vital signs were monitored throughout the case.  We placed a Bentson wire followed by 5 [place] sheath performed aortogram and a lateral view.  I then placed a Bentson wire back placed a long 7 [place] sheath.  Patient was given additional 5000 units of heparin.  The SMA was cannulated with an Omni catheter and Bentson wire and then I placed a Rosen wire and the 7 French sheath was placed into the SMA.  Additional angiography was performed.  We then performed percutaneous mechanical thrombectomy on 2 separate passes there was much better illumination of the distal SMA.  Satisfied with this we removed the catheter sheath was retracted over wire exchanged for a short 7 French sheath and a minx device was deployed.  He tolerated procedure well without any complication. Contrast: 70cc EBL: 200cc Brandon C. Donzetta Matters, MD [facility] Office: [phone] Pager: [phone]   CT Angio Chest/Abd/Pel for Dissection W and/or W/WO  Result Date: 08/09/2022 CLINICAL DATA:  Abdominal pain radiating to both flanks since early this morning. Denies chest pain. EXAM: CT ANGIOGRAPHY CHEST, ABDOMEN AND PELVIS TECHNIQUE: Non-contrast CT of the chest was initially obtained. Multidetector CT imaging through the chest, abdomen and pelvis was performed using the standard protocol during bolus administration of intravenous contrast. Multiplanar reconstructed images and MIPs were obtained and reviewed to evaluate the vascular anatomy. RADIATION DOSE REDUCTION: This exam was performed according to the departmental dose-optimization program which includes automated exposure control, adjustment of the mA  and/or kV according to patient size and/or use of iterative reconstruction technique. CONTRAST:  193m OMNIPAQUE IOHEXOL 350 MG/ML SOLN COMPARISON:  Chest x-ray from same day. FINDINGS: CTA CHEST FINDINGS Cardiovascular: No thoracic aorta intramural hematoma. Preferential opacification of the thoracic aorta. No evidence of thoracic aortic aneurysm or dissection. Coronary, aortic arch, and branch vessel atherosclerotic vascular disease. Mild cardiomegaly with biatrial enlargement. No pericardial effusion. No pulmonary embolism. Mediastinum/Nodes: No enlarged mediastinal, hilar, or axillary lymph nodes. Patulous esophagus. Thyroid gland and trachea demonstrate no significant findings. Lungs/Pleura: No focal consolidation, pleural effusion, or pneumothorax. Musculoskeletal: No chest wall abnormality. No acute or significant osseous findings. Review of the MIP images confirms the above findings. CTA ABDOMEN AND PELVIS FINDINGS VASCULAR Aorta: Normal caliber aorta without aneurysm, dissection, vasculitis or significant stenosis. Moderate to severe atherosclerotic calcification. Celiac: Patent without evidence of aneurysm, dissection, vasculitis or significant stenosis. 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 Reconstitution of distal branch vessels. Renals: Both renal arteries are patent without evidence of aneurysm, dissection, vasculitis, fibromuscular dysplasia or significant stenosis. IMA: Patent without evidence of aneurysm, dissection, vasculitis or significant stenosis. Inflow: Patent without evidence of aneurysm, dissection, vasculitis or significant stenosis. Veins: No obvious venous abnormality within the limitations of this arterial phase study. Review of the MIP images confirms the above findings. NON-VASCULAR Hepatobiliary: No focal liver abnormality is seen. No gallstones, gallbladder wall  thickening, or biliary dilatation. Pancreas: Unremarkable. No pancreatic ductal dilatation or surrounding  inflammatory changes. Spleen: Normal in size without focal abnormality. Adrenals/Urinary Tract: Adrenal glands are unremarkable. Kidneys are normal, without renal calculi, solid lesion, or hydronephrosis. Simple cyst in the left kidney measuring up to 1.6 cm. No follow-up imaging is recommended. Bladder is unremarkable. Stomach/Bowel: Stomach is within normal limits. Appendix appears normal. No evidence of bowel wall thickening, distention, or inflammatory changes. No pneumatosis. Left-sided colonic diverticulosis. Lymphatic: No enlarged abdominal or pelvic lymph nodes. Reproductive: Prostate is unremarkable. Other: No abdominal wall hernia or abnormality. No abdominopelvic ascites. No pneumoperitoneum. Musculoskeletal: No acute or significant osseous findings. Review of the MIP images confirms the above findings. IMPRESSION: 1. 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 Reconstitution of distal branch vessels. No CT evidence of bowel ischemia at this time. 2. No evidence of aortic aneurysm or dissection. 3. Aortic Atherosclerosis (ICD10-I70.0). Critical Value/emergent results were called by telephone at the time of interpretation on 07/28/2022 at 11:17 am to provider [facility] , who verbally acknowledged these results. Electronically Signed   By: Titus Dubin M.D.   On: 08/03/2022 11:19   DG Chest Port 1 View  Result Date: 07/30/2022 CLINICAL DATA:  Abdominal pain, back pain EXAM: PORTABLE CHEST 1 VIEW COMPARISON:  07/09/2014 FINDINGS: Transverse diameter heart is slightly increased. Thoracic aorta is tortuous. There are no signs of pulmonary edema or focal pulmonary consolidation. There is no pleural effusion or pneumothorax. IMPRESSION: No active disease. Electronically Signed   By: Elmer Picker M.D.   On: 08/06/2022 10:18     Assessment/Plan Pleasant 88 year old male with atrial fibrillation POD #1 s/p SMA thrombectomy.  He now has increasing abdominal pain concerning for possible bowel ischemia versus pain from reperfusion.  I recommend proceeding to the operating room for diagnostic laparoscopy, possible laparotomy, possible bowel resection.  Patient's heparin was held before 8 AM today.  The risks of surgery were discussed with the patient and he wishes to proceed.  We will also speak to his son, George Potter.   I reviewed nursing notes, ED provider notes, last 24 h vitals and pain scores, last 48 h intake and output, last 24 h labs and trends, and last 24 h imaging results.  Jill Alexanders, [facility] Surgery 08/03/2022, 7:42 AM Please see Amion for pager number during day hours 7:00am-4:30pm or 7:00am -11:30am on weekends

## 2022-08-02 NOTE — Progress Notes (Signed)
Notified DR. Donzetta Matters that patient has been restless and complaining of abdominal cramp/pain, tylenol not helping him much and patient kept asking for Aleve . That's what he takes at home. Patient was slightly better when rechecked and trying to rest. MD stated he can have aleve if it worsens. Also notified MD that his Hgb went down to 13, and WBC went up to 28.3, vitals stable, heparin on hold for an hour due to elevated heparin level. And last stool was last night 2200. MD stated he will involve general surgery in am.  Currently, patient resting in bed with eyes closed. Breathing even and unlabored in RA. Will Restart heparin at 530 per pharmacy order. will continue to monitor.

## 2022-08-02 NOTE — Anesthesia Preprocedure Evaluation (Addendum)
Anesthesia Evaluation  Patient identified by MRN, date of birth, ID band Patient awake    Reviewed: Allergy & Precautions, NPO status , Patient's Chart, lab work & pertinent test results  Airway Mallampati: III  TM Distance: >3 FB Neck ROM: Full    Dental  (+) Teeth Intact, Dental Advisory Given   Pulmonary neg pulmonary ROS,    breath sounds clear to auscultation       Cardiovascular hypertension, Pt. on medications + Peripheral Vascular Disease  + dysrhythmias Atrial Fibrillation  Rhythm:Irregular Rate:Tachycardia     Neuro/Psych negative neurological ROS  negative psych ROS   GI/Hepatic Neg liver ROS, GERD  Medicated,  Endo/Other  negative endocrine ROS  Renal/GU negative Renal ROS     Musculoskeletal negative musculoskeletal ROS (+)   Abdominal Normal abdominal exam  (+)   Peds  Hematology negative hematology ROS (+)   Anesthesia Other Findings   Reproductive/Obstetrics                             Lab Results  Component Value Date   WBC 28.3 (H) 08/22/2022   HGB 13.0 08/03/2022   HCT 40.1 07/29/2022   MCV 91.1 08/03/2022   PLT 100 (L) 07/27/2022   Lab Results  Component Value Date   CREATININE 1.45 (H) 08/05/2022   BUN 13 07/30/2022   NA 136 07/28/2022   K 5.0 08/10/2022   CL 105 07/26/2022   CO2 20 (L) 08/25/2022   Lab Results  Component Value Date   INR 1.2 08/20/2022   INR 1.0 07/22/2008   EKG: atrial fibrillation.   Anesthesia Physical  Anesthesia Plan  ASA: III and emergent  Anesthesia Plan: General   Post-op Pain Management:    Induction: Intravenous, Rapid sequence and Cricoid pressure planned  PONV Risk Score and Plan: 2 and Ondansetron, Treatment may vary due to age or medical condition and Midazolam  Airway Management Planned: Oral ETT  Additional Equipment: None  Intra-op Plan:   Post-operative Plan: Extubation in OR  Informed Consent: I  have reviewed the patients History and Physical, chart, labs and discussed the procedure including the risks, benefits and alternatives for the proposed anesthesia with the patient or authorized representative who has indicated his/her understanding and acceptance.     Dental advisory given  Plan Discussed with: CRNA  Anesthesia Plan Comments:        Anesthesia Quick Evaluation

## 2022-08-03 ENCOUNTER — Inpatient Hospital Stay (HOSPITAL_COMMUNITY): Payer: Medicare Other

## 2022-08-03 ENCOUNTER — Encounter (HOSPITAL_COMMUNITY): Payer: Self-pay | Admitting: General Surgery

## 2022-08-03 ENCOUNTER — Other Ambulatory Visit: Payer: Self-pay

## 2022-08-03 DIAGNOSIS — E440 Moderate protein-calorie malnutrition: Secondary | ICD-10-CM | POA: Insufficient documentation

## 2022-08-03 DIAGNOSIS — K551 Chronic vascular disorders of intestine: Secondary | ICD-10-CM | POA: Diagnosis not present

## 2022-08-03 LAB — POCT I-STAT 7, (LYTES, BLD GAS, ICA,H+H)
Acid-base deficit: 11 mmol/L — ABNORMAL HIGH (ref 0.0–2.0)
Acid-base deficit: 3 mmol/L — ABNORMAL HIGH (ref 0.0–2.0)
Bicarbonate: 16 mmol/L — ABNORMAL LOW (ref 20.0–28.0)
Bicarbonate: 20.4 mmol/L (ref 20.0–28.0)
Calcium, Ion: 1.11 mmol/L — ABNORMAL LOW (ref 1.15–1.40)
Calcium, Ion: 1.03 mmol/L — ABNORMAL LOW (ref 1.15–1.40)
HCT: 26 % — ABNORMAL LOW (ref 39.0–52.0)
HCT: 26 % — ABNORMAL LOW (ref 39.0–52.0)
Hemoglobin: 8.8 g/dL — ABNORMAL LOW (ref 13.0–17.0)
Hemoglobin: 8.8 g/dL — ABNORMAL LOW (ref 13.0–17.0)
O2 Saturation: 100 %
O2 Saturation: 100 %
Patient temperature: 36.2
Potassium: 3.5 mmol/L (ref 3.5–5.1)
Potassium: 3.6 mmol/L (ref 3.5–5.1)
Sodium: 138 mmol/L (ref 135–145)
Sodium: 140 mmol/L (ref 135–145)
TCO2: 17 mmol/L — ABNORMAL LOW (ref 22–32)
TCO2: 21 mmol/L — ABNORMAL LOW (ref 22–32)
pCO2 arterial: 39.5 mmHg (ref 32–48)
pCO2 arterial: 27 mmHg — ABNORMAL LOW (ref 32–48)
pH, Arterial: 7.215 — ABNORMAL LOW (ref 7.35–7.45)
pH, Arterial: 7.484 — ABNORMAL HIGH (ref 7.35–7.45)
pO2, Arterial: 418 mmHg — ABNORMAL HIGH (ref 83–108)
pO2, Arterial: 160 mmHg — ABNORMAL HIGH (ref 83–108)

## 2022-08-03 LAB — SURGICAL PCR SCREEN
MRSA, PCR: POSITIVE — AB
Staphylococcus aureus: POSITIVE — AB

## 2022-08-03 LAB — HEPATIC FUNCTION PANEL
ALT: 16 U/L (ref 0–44)
AST: 28 U/L (ref 15–41)
Albumin: 2.6 g/dL — ABNORMAL LOW (ref 3.5–5.0)
Alkaline Phosphatase: 32 U/L — ABNORMAL LOW (ref 38–126)
Bilirubin, Direct: 1 mg/dL — ABNORMAL HIGH (ref 0.0–0.2)
Indirect Bilirubin: 1.1 mg/dL — ABNORMAL HIGH (ref 0.3–0.9)
Total Bilirubin: 2.1 mg/dL — ABNORMAL HIGH (ref 0.3–1.2)
Total Protein: 4.7 g/dL — ABNORMAL LOW (ref 6.5–8.1)

## 2022-08-03 LAB — CBC
HCT: 28.4 % — ABNORMAL LOW (ref 39.0–52.0)
Hemoglobin: 9.8 g/dL — ABNORMAL LOW (ref 13.0–17.0)
MCH: 29.7 pg (ref 26.0–34.0)
MCHC: 34.5 g/dL (ref 30.0–36.0)
MCV: 86.1 fL (ref 80.0–100.0)
Platelets: 67 10*3/uL — ABNORMAL LOW (ref 150–400)
RBC: 3.3 MIL/uL — ABNORMAL LOW (ref 4.22–5.81)
RDW: 13.5 % (ref 11.5–15.5)
WBC: 27.6 10*3/uL — ABNORMAL HIGH (ref 4.0–10.5)
nRBC: 0 % (ref 0.0–0.2)

## 2022-08-03 LAB — PHOSPHORUS: Phosphorus: 2.4 mg/dL — ABNORMAL LOW (ref 2.5–4.6)

## 2022-08-03 LAB — BASIC METABOLIC PANEL
Anion gap: 11 (ref 5–15)
BUN: 27 mg/dL — ABNORMAL HIGH (ref 8–23)
CO2: 18 mmol/L — ABNORMAL LOW (ref 22–32)
Calcium: 7.4 mg/dL — ABNORMAL LOW (ref 8.9–10.3)
Chloride: 108 mmol/L (ref 98–111)
Creatinine, Ser: 1.96 mg/dL — ABNORMAL HIGH (ref 0.61–1.24)
GFR, Estimated: 31 mL/min — ABNORMAL LOW (ref >60)
Glucose, Bld: 99 mg/dL (ref 70–99)
Potassium: 3.7 mmol/L (ref 3.5–5.1)
Sodium: 137 mmol/L (ref 135–145)

## 2022-08-03 LAB — GLUCOSE, CAPILLARY
Glucose-Capillary: 95 mg/dL (ref 70–99)
Glucose-Capillary: 99 mg/dL (ref 70–99)
Glucose-Capillary: 83 mg/dL (ref 70–99)
Glucose-Capillary: 100 mg/dL — ABNORMAL HIGH (ref 70–99)
Glucose-Capillary: 122 mg/dL — ABNORMAL HIGH (ref 70–99)

## 2022-08-03 LAB — HEPARIN LEVEL (UNFRACTIONATED)
Heparin Unfractionated: 0.29 IU/mL — ABNORMAL LOW (ref 0.30–0.70)
Heparin Unfractionated: 0.24 IU/mL — ABNORMAL LOW (ref 0.30–0.70)
Heparin Unfractionated: 0.23 IU/mL — ABNORMAL LOW (ref 0.30–0.70)

## 2022-08-03 LAB — MAGNESIUM
Magnesium: 1.2 mg/dL — ABNORMAL LOW (ref 1.7–2.4)
Magnesium: 2.7 mg/dL — ABNORMAL HIGH (ref 1.7–2.4)

## 2022-08-03 LAB — TRIGLYCERIDES: Triglycerides: 104 mg/dL (ref <150)

## 2022-08-03 LAB — LACTIC ACID, PLASMA: Lactic Acid, Venous: 4.9 mmol/L (ref 0.5–1.9)

## 2022-08-03 MED ORDER — MAGNESIUM SULFATE 4 GM/100ML IV SOLN
4.0000 g | Freq: Once | INTRAVENOUS | Status: AC
  Administered 2022-08-03: 4 g via INTRAVENOUS
  Filled 2022-08-03: qty 100

## 2022-08-03 MED ORDER — MUPIROCIN 2 % EX OINT
1.0000 | TOPICAL_OINTMENT | Freq: Two times a day (BID) | CUTANEOUS | Status: DC
  Administered 2022-08-03 – 2022-08-06 (×6): 1 via NASAL
  Filled 2022-08-03 (×3): qty 22

## 2022-08-03 MED ORDER — SODIUM BICARBONATE 8.4 % IV SOLN
INTRAVENOUS | Status: DC
  Filled 2022-08-03 (×2): qty 1000

## 2022-08-03 MED ORDER — MAGNESIUM SULFATE 2 GM/50ML IV SOLN
2.0000 g | Freq: Once | INTRAVENOUS | Status: AC
  Administered 2022-08-03: 2 g via INTRAVENOUS
  Filled 2022-08-03: qty 50

## 2022-08-03 MED ORDER — POTASSIUM CHLORIDE 10 MEQ/50ML IV SOLN
10.0000 meq | INTRAVENOUS | Status: AC
  Administered 2022-08-03 (×2): 10 meq via INTRAVENOUS
  Filled 2022-08-03 (×2): qty 50

## 2022-08-03 MED ORDER — POTASSIUM CHLORIDE 10 MEQ/50ML IV SOLN: 10.0000 meq | INTRAVENOUS | Status: DC

## 2022-08-03 NOTE — Progress Notes (Signed)
Initial Nutrition Assessment  DOCUMENTATION CODES:   Non-severe (moderate) malnutrition in context of chronic illness  INTERVENTION:   -Recommend early nutrition of TPN given pt's advanced age, high nutrition risk, malnutrition. Discussed with Dr. Tamala Julian; likely plan to start TPN tomorrow post OR   NUTRITION DIAGNOSIS:   Moderate Malnutrition related to chronic illness as evidenced by moderate fat depletion, moderate muscle depletion, severe muscle depletion.  GOAL:   Patient will meet greater than or equal to 90% of their needs   MONITOR:   Vent status, Labs, Weight trends, I & O's  REASON FOR ASSESSMENT:   Ventilator    ASSESSMENT:   88 yo male admitted with abdominal pain and found to have SMA occlusion and underwent perc thrombectomy; developed worsening abd pain with increasing lactate and taken to OR on 8/8 fpr ex lap and required SB resection for ischemia. PMH includes chronic mesenteric ischemia, afib, HTN, diverticulosis  8/07 Acute mesenteric ischemia with distal SMA thrombosis requiring perc thrombectomy 8/08 Ex lap, SB resection (bowel left in discontinuity), placement of abthera VAC  Pt remains intubated, sedated. Pt's son at bedside.  Noted plan to return to OR tomorrow Propofol: 12.2 ml/hr (322 kcals/24 hours) Remains on pressors, neo-synephrine at 45 mcg/min  NG to LIS-1.6 L in 24 hours, bowel left in discontinuity so nothing to be administered per tube at this time  Wound VAC with 450 mL in 24 hours, 250 mL thus far today  Typical Intake:  B: Honeybun with Coffee L: Cereal with Peanut Butter  D: Frozen Steak Pie  Pt lives at home alone, Son and his family live 5 minutes from patient and check on him regularly. Pt is able to ambulate using a walker, son states pt's balance is not very good. Pt also with poor dentition (does not have dental insurance and has some "bad" teeth) so chewing is an issue at times. Of note, pt reports pt really decline during  COVID. Prior to [place], pt was very active, driving, going out regularly. Now, pt mostly stays at home unless family takes him out somewhere.   Son believes pt has lost weight. Net + 4L. No weight today, weight yesterday 68 kg (150 pounds). Son reports pt UBW used to be around 170 pounds.   Labs:Creatinine 1.96, BUN 27, CBGs 78-99, lactic acid trending back down Meds: sodium bicarb at 75 ml/hr   NUTRITION - FOCUSED PHYSICAL EXAM:  Flowsheet Row Most Recent Value  Orbital Region Severe depletion  Upper Arm Region Mild depletion  Thoracic and Lumbar Region Moderate depletion  Buccal Region Unable to assess  Temple Region Severe depletion  Clavicle Bone Region Severe depletion  Clavicle and Acromion Bone Region Moderate depletion  Scapular Bone Region Moderate depletion  Dorsal Hand Unable to assess  Patellar Region Moderate depletion  Anterior Thigh Region Moderate depletion  Posterior Calf Region Severe depletion  Edema (RD Assessment) Mild       Diet Order:   Diet Order             Diet NPO time specified  Diet effective now                   EDUCATION NEEDS:   Not appropriate for education at this time  Skin:  Skin Assessment: Skin Integrity Issues: Skin Integrity Issues:: Stage II, Wound VAC Stage II: coccyx Wound Vac: open abdomen post resection  Last BM:  8/7  Height:   Ht Readings from Last 1 Encounters:  07/29/2022 5'  10" (1.778 m)    Weight:   Wt Readings from Last 1 Encounters:  08/10/2022 68 kg    BMI:  Body mass index is 21.51 kg/m.  Estimated Nutritional Needs:   Kcal:  1900-2000 kcals  Protein:  115-130 g  Fluid:  >/= 1.9 L  Kerman Passey MS, RDN, LDN, CNSC Registered Dietitian 3 Clinical Nutrition RD Pager and On-Call Pager Number Located in [place]

## 2022-08-03 NOTE — Progress Notes (Addendum)
  Progress Note    08/03/2022 7:45 AM 1 Day Post-Op  Subjective:  intubated and sedated   Vitals:   08/03/22 0700 08/03/22 0744  BP: (!) 104/59   Pulse: 63   Resp: 18   Temp:  (!) 97.3 F (36.3 C)  SpO2: 100%    Physical Exam: Cardiac: irregular Lungs:  intubated Incisions:  mid line incision with wound VAC with SS output Extremities: R femoral dressing c/d/I. 2+ femoral pulses bilaterally. Extremities warm and well perfused. Doppler DP left, faint R Abdomen: soft, non distended, absent bs Neurologic: sedated  CBC    Component Value Date/Time   WBC 27.6 (H) 08/03/2022 0340   RBC 3.30 (L) 08/03/2022 0340   HGB 9.8 (L) 08/03/2022 0340   HCT 28.4 (L) 08/03/2022 0340   PLT 67 (L) 08/03/2022 0340   MCV 86.1 08/03/2022 0340   MCH 29.7 08/03/2022 0340   MCHC 34.5 08/03/2022 0340   RDW 13.5 08/03/2022 0340   LYMPHSABS 2.6 08/06/2022 1003   MONOABS 0.6 08/03/2022 1003   EOSABS 0.0 08/08/2022 1003   BASOSABS 0.0 07/29/2022 1003    BMET    Component Value Date/Time   NA 137 08/03/2022 0340   K 3.7 08/03/2022 0340   CL 108 08/03/2022 0340   CO2 18 (L) 08/03/2022 0340   GLUCOSE 99 08/03/2022 0340   BUN 27 (H) 08/03/2022 0340   CREATININE 1.96 (H) 08/03/2022 0340   CALCIUM 7.4 (L) 08/03/2022 0340   GFRNONAA 31 (L) 08/03/2022 0340   GFRAA 50 (L) 09/25/2019 0413    INR    Component Value Date/Time   INR 1.2 08/18/2022 1133     Intake/Output Summary (Last 24 hours) at 08/03/2022 0745 Last data filed at 08/03/2022 0600 Gross per 24 hour  Intake 5537.65 ml  Output 2635 ml  Net 2902.65 ml     Assessment/Plan:  88 y.o. male is s/p SMA thrombectomy 2 Day Post and Ex Lap with Small bowel resection for ischemic bowel 1 Day Post-Op   Remains intubated and sedated Abdomen is soft. Absent bowel sounds. Wound VAC with SS output Lactic acidemia improving Replacing Mg and K+ WBC trending down H&H stable SCr stable. Minimal UOP overnight  IV heparin restarted  Bowel  management, Wound VAC per CCS. Tentative plan to return to OR later this week Appreciate PCCM and General Surgery assistance  DVT prophylaxis:  Heparin IV   Karoline Caldwell, PA-C [facility] [phone] 08/03/2022 7:45 AM  I have independently examined George Potter and agree with PA assessment and plan above.  Appreciate support of general surgery and CCM.  Vanilla Heatherington C. Donzetta Matters, MD [facility] [place] Office: [phone] Pager: [phone]

## 2022-08-03 NOTE — Progress Notes (Signed)
[facility] ADULT ICU REPLACEMENT PROTOCOL   The patient does apply for the [facility] Adult ICU Electrolyte Replacment Protocol based on the criteria listed below:   1.Exclusion criteria: TCTS patients, ECMO patients, and Dialysis patients 2. Is GFR >/= 30 ml/min? Yes.    Patient's GFR today is 31 3. Is SCr </= 2? Yes.   Patient's SCr is 1.96 mg/dL 4. Did SCr increase >/= 0.5 in 24 hours? No. 5.Pt's weight >40kg  Yes.   6. Abnormal electrolyte(s): Mag 1.2  7. Electrolytes replaced per protocol 8.  Call MD STAT for K+ </= 2.5, Phos </= 1, or Mag </= 1 Physician:  Brandt Loosen, Eva Griffo A 08/03/2022 5:06 AM

## 2022-08-03 NOTE — Procedures (Signed)
Central Venous Catheter Insertion Procedure Note  George Potter  [id number]  [birth year]  Date:08/03/22  Time:11:43 AM   Provider Performing:Rosario Kushner Loletha Grayer Tamala Julian   Procedure: Insertion of Non-tunneled Central Venous [phone]) with US guidance ([id number])   Indication(s) Medication administration  Consent Risks of the procedure as well as the alternatives and risks of each were explained to the patient and/or caregiver.  Consent for the procedure was obtained and is signed in the bedside chart  Anesthesia Topical only with 1% lidocaine   Timeout Verified patient identification, verified procedure, site/side was marked, verified correct patient position, special equipment/implants available, medications/allergies/relevant history reviewed, required imaging and test results available.  Sterile Technique Maximal sterile technique including full sterile barrier drape, hand hygiene, sterile gown, sterile gloves, mask, hair covering, sterile ultrasound probe cover (if used).  Procedure Description Area of catheter insertion was cleaned with chlorhexidine and draped in sterile fashion.  With real-time ultrasound guidance a central venous catheter was placed into the left internal jugular vein. Nonpulsatile blood flow and easy flushing noted in all ports.  The catheter was sutured in place and sterile dressing applied.  Complications/Tolerance None; patient tolerated the procedure well. Chest X-ray is ordered to verify placement for internal jugular or subclavian cannulation.   Chest x-ray is not ordered for femoral cannulation.  EBL Minimal  Specimen(s) None

## 2022-08-03 NOTE — Progress Notes (Signed)
ANTICOAGULATION CONSULT NOTE - Follow Up Consult  Pharmacy Consult for heparin Indication:  SMA occlusion now s/p thrombectomy  Labs: Recent Labs    08/05/2022 1003 08/16/2022 1032 07/28/2022 1133 08/19/2022 1144 08/17/2022 1203 08/22/2022 1954 08/24/2022 0314 08/08/2022 1238 07/31/2022 1444 08/10/2022 1735 08/03/22 0340  HGB 16.1   < >  --   --   --  14.6 13.0   < > 8.8* 9.8* 9.8*  HCT 47.7   < >  --   --   --  44.1 40.1   < > 26.0* 28.7* 28.4*  PLT 136*  --   --   --   --  105* 100*  --   --  71* 67*  APTT  --   --   --  34  --   --   --   --   --   --   --   LABPROT  --   --  14.6  --   --   --   --   --   --   --   --   INR  --   --  1.2  --   --   --   --   --   --   --   --   HEPARINUNFRC  --   --   --  <0.10*  --   --  >1.10*  --   --   --  0.29*  CREATININE 1.32*   < >  --   --   --  1.45*  --   --   --  1.99* 1.96*  TROPONINIHS 6  --   --   --  8  --   --   --   --   --   --    < > = values in this interval not displayed.     Assessment: 88yo male now slightly subtherapeutic on heparin after resumed s/p ex-lap.  Goal of Therapy:  Heparin level 0.3-0.7 units/ml   Plan:  Will increase heparin gtt by 1-2 units/kg/hr to 850 units/hr and check level in 8 hours.    Wynona Neat, PharmD, BCPS  08/03/2022,4:39 AM

## 2022-08-03 NOTE — Progress Notes (Signed)
  Transition of Care [facility]) Screening Note   Patient Details  Name: BREAKER SPRINGER Date of Birth: [birth year]   Transition of Care [facility]) CM/SW Contact:    Milas Gain, [place] Phone Number: 08/03/2022, 3:49 PM    Transition of Care Department [facility]) has reviewed patient and no TOC needs have been identified at this time. We will continue to monitor patient advancement through interdisciplinary progression rounds. If new patient transition needs arise, please place a TOC consult.

## 2022-08-03 NOTE — Anesthesia Postprocedure Evaluation (Signed)
Anesthesia Post Note  Patient: George Potter  Procedure(s) Performed: LAPAROSCOPY DIAGNOSTIC ,EXPLORATORY LAPAROTOMY, small bowel resection, application of abthera vac     Patient location during evaluation: ICU Anesthesia Type: General Level of consciousness: patient remains intubated per anesthesia plan Pain management: pain level controlled Vital Signs Assessment: post-procedure vital signs reviewed and stable Respiratory status: spontaneous breathing, nonlabored ventilation, respiratory function stable and patient remains intubated per anesthesia plan Cardiovascular status: blood pressure returned to baseline and stable Postop Assessment: no apparent nausea or vomiting Anesthetic complications: no   No notable events documented.  Last Vitals:  Vitals:   08/03/22 0800 08/03/22 0815  BP: (!) 113/57 (!) 97/57  Pulse: 82 77  Resp: 18 18  Temp:    SpO2: 100% 100%    Last Pain:  Vitals:   08/03/22 0745  TempSrc: Esophageal  PainSc:                  Lynda Rainwater

## 2022-08-03 NOTE — Progress Notes (Signed)
Telephone consent from son, Alverto Shedd, for central line placement. Second RN, Lavella Lemons, verified. Consent signed and in chart.

## 2022-08-03 NOTE — Progress Notes (Signed)
   NAME:  George Potter, MRN:  [id number], DOB:  05/04/1926, LOS: 1 ADMISSION DATE:  08/08/2022, CONSULTATION DATE:  08/08/2022 REFERRING MD:  Laneta Simmers, MD, CHIEF COMPLAINT:  abd pain   History of Present Illness:  Patient is a 88 year old male with a hx chronic  mesenteric ischemia and afib (not on AC past 3 years due to rectal bleeding and bruising) and PAD with prior ischemic left leg (required iliofemoral thrombectomy by Dr. Trula Slade 10/14/2018) and HTN presented 8/7 to [facility] with complaints of abdominal pain onset 30 minutes after eating honey bun and coffee. Pain most severe RLQ. Found to have acute occlusion of SMA and was started on heparin and transferred to [facility] for vascular surgery evaluation. He underwent percutaneous thrombectomy of SMA via R femoral on 8/7 with initial symptom improvement. Pain acutely worsened overnight and became constant. Pt noted blood in his stool and inability to move his bowels.   Pertinent  Medical History   Past Medical History:  Diagnosis Date   Atrial fibrillation, chronic (HCC)    a. on Eliquis   Diverticulosis    HTN (hypertension)    Mild   Severe mitral regurgitation      Significant Hospital Events: Including procedures, antibiotic start and stop dates in addition to other pertinent events   8/7 SMA thrombectomy 8/8 ex-lap of necrotic bowel  Interim History / Subjective:  No events. Intubated, sedated, on small pressors.  Objective   Blood pressure (!) 104/59, pulse 63, temperature 97.7 F (36.5 C), temperature source Esophageal, resp. rate 18, height '5\' 10"'$  (1.778 m), weight 68 kg, SpO2 100 %.    Vent Mode: PRVC FiO2 (%):  [40 %-100 %] 40 % Set Rate:  [18 bmp] 18 bmp Vt Set:  [580 mL] 580 mL PEEP:  [5 cmH20] 5 cmH20 Plateau Pressure:  [13 cmH20-16 cmH20] 13 cmH20   Intake/Output Summary (Last 24 hours) at 08/03/2022 0710 Last data filed at 08/03/2022 0600 Gross per 24 hour  Intake 5537.65 ml  Output 2635 ml  Net  2902.65 ml    Filed Weights   07/28/2022 0955 08/17/2022 0834  Weight: 68 kg 68 kg    Examination: No distress ETT with minimal secretions Upward gaze, pupils small, equal RASS -4 Lung sounds diminished on R Heart sounds tachy, irregular, afib on monitor Ext warm Abd midline incision covered with wound vac minimal output  K a bit low being repleted Bicarb remains low but also getting D5NS Lactate improved WBC/Plts down a bit H/H stable CBG on lower side  Resolved Hospital Problem list     Assessment & Plan:  S/P SMA thrombectomy for chronic mesenteric ischemia 8/7 S/P Ex lap for ischemic bowel 8/8 Postoperative shock AKI related to fluid shifts, distributive shock Lactic acidemia improved Hypo-Mg, Hypo-K- repleting Ventilator dependence- extubation depending on operative plans Afib on AC  - Bowel management, operative/drain plans per CCS - Heparin gtt - Neo to MAP 65 - Usual transfusion thresholds - Avoid nephrotoxins - Vent bundle, prop/fent to RASS -1 to 0 - Maintenance fluids from D5NS to D5 bicarb - Will follow  Best Practice (right click and "Reselect all SmartList Selections" daily)   Diet/type: NPO DVT prophylaxis: heparin gtt GI prophylaxis: PPI Lines: Arterial Line Foley:  Yes, and it is still needed Code Status:  full code Last date of multidisciplinary goals of care discussion [per primary]  34 min cc time Erskine Emery MD PCCM

## 2022-08-03 NOTE — Progress Notes (Addendum)
[facility] Progress Note  1 Day Post-Op  Subjective: CC:  Intubated, sedated  On 70 of neo, no other pressors  Hep gtt running  Objective: Vital signs in last 24 hours: Temp:  [95.4 F (35.2 C)-97.7 F (36.5 C)] 97.3 F (36.3 C) (08/09 0745) Pulse Rate:  [63-202] 80 (08/09 0915) Resp:  [16-19] 18 (08/09 0915) BP: (83-126)/(52-74) 110/56 (08/09 0915) SpO2:  [70 %-100 %] 100 % (08/09 0915) Arterial Line BP: (91-159)/(39-70) 128/52 (08/09 0915) FiO2 (%):  [32 %-100 %] 32 % (08/09 0750) Last BM Date : 07/27/2022  Intake/Output from previous day: 08/08 0701 - 08/09 0700 In: 5537.7 [I.V.:4558.2; IV Piggyback:979.4] Out: 2635 [Urine:485; Emesis/NG output:1600; Drains:450; Blood:100] Intake/Output this shift: Total I/O In: 449.1 [I.V.:328.3; IV Piggyback:120.8] Out: 90 [Urine:90]  PE: Gen:  critically ill appearing male Card:  irregular, 74 bpm on tele Pulm:  ventilated respirations Abd: Soft, abthera VAC holding suction, small amt SS drainage in cannister Skin: warm and dry, no rashes  Psych: unable to assess  Lab Results:  Recent Labs    08/03/2022 1735 08/03/22 0340 08/03/22 0746  WBC 30.5* 27.6*  --   HGB 9.8* 9.8* 8.8*  HCT 28.7* 28.4* 26.0*  PLT 71* 67*  --    BMET Recent Labs    08/15/2022 1735 08/03/22 0340 08/03/22 0746  NA 138 137 140  K 4.1 3.7 3.6  CL 102 108  --   CO2 16* 18*  --   GLUCOSE 62* 99  --   BUN 27* 27*  --   CREATININE 1.99* 1.96*  --   CALCIUM 7.9* 7.4*  --    PT/INR Recent Labs    08/17/2022 1133  LABPROT 14.6  INR 1.2   CMP     Component Value Date/Time   NA 140 08/03/2022 0746   K 3.6 08/03/2022 0746   CL 108 08/03/2022 0340   CO2 18 (L) 08/03/2022 0340   GLUCOSE 99 08/03/2022 0340   BUN 27 (H) 08/03/2022 0340   CREATININE 1.96 (H) 08/03/2022 0340   CALCIUM 7.4 (L) 08/03/2022 0340   PROT 4.7 (L) 08/03/2022 0340   ALBUMIN 2.6 (L) 08/03/2022 0340   AST 28 08/03/2022 0340   ALT 16 08/03/2022 0340    ALKPHOS 32 (L) 08/03/2022 0340   BILITOT 2.1 (H) 08/03/2022 0340   GFRNONAA 31 (L) 08/03/2022 0340   GFRAA 50 (L) 09/25/2019 0413   Lipase     Component Value Date/Time   LIPASE 39 08/12/2022 1003       Studies/Results: DG Chest 1 View  Result Date: 08/13/2022 CLINICAL DATA:  NG tube placement EXAM: CHEST  1 VIEW COMPARISON:  Chest x-ray dated August 01, 2022 FINDINGS: NG tube courses below the diaphragm with tip in the stomach and side port near the GE junction. ETT tip is approximately 4.5 cm from the carina. Cardiac and mediastinal contours are unchanged. Mild left basilar opacities, likely due to atelectasis. No large pleural effusion or evidence of pneumothorax. IMPRESSION: NG tube tip is in the stomach and with side port near the GE junction, recommend advancement for optimal positioning. Electronically Signed   By: Yetta Glassman M.D.   On: 08/22/2022 15:02   PERIPHERAL VASCULAR CATHETERIZATION  Result Date: 08/10/2022 Images from the original result were not included. Patient name: George Potter MRN: [id number] DOB: 07-Mar-1926 Sex: male 08/25/2022 Pre-operative Diagnosis: Acute mesenteric ischemia Post-operative diagnosis:  Same Surgeon:  Erlene Quan C. Donzetta Matters, MD Procedure Performed: 1.  Ultrasound-guided cannulation right  common femoral artery 2.  Aortogram 3.  Selection superior mesenteric artery 4.  Percutaneous mechanical thrombectomy superior mesenteric artery with CAT 7 lightning penumbra 5.  Mynx device closure right common femoral artery 6.  Moderate sedation with fentanyl and Versed for 44 minutes Indications: 88 year old male presenting with acute mesenteric ischemia today with distal SMA thrombus and is now indicated for angiography with possible mechanical thrombectomy. Findings: There was distal thrombus in the SMA this was aspirated at completion there was much improved flow distally in the SMA with illumination of multiple distal branches.  There was visible thrombus within the  canister at completion.  Procedure:  The patient was identified in the holding area and taken to room 8.  The patient was then placed supine on the table and prepped and draped in the usual sterile fashion.  A time out was called.  Ultrasound was used to evaluate the right common femoral artery which was noted be patent and compressible.  The area was anesthetized with 1% lidocaine cannulated with micropuncture needle followed by wire sheath.  Images saved the permanent record.  Concomitantly moderate sedation was administered with fentanyl and Versed and his vital signs were monitored throughout the case.  We placed a Bentson wire followed by 5 [place] sheath performed aortogram and a lateral view.  I then placed a Bentson wire back placed a long 7 [place] sheath.  Patient was given additional 5000 units of heparin.  The SMA was cannulated with an Omni catheter and Bentson wire and then I placed a Rosen wire and the 7 French sheath was placed into the SMA.  Additional angiography was performed.  We then performed percutaneous mechanical thrombectomy on 2 separate passes there was much better illumination of the distal SMA.  Satisfied with this we removed the catheter sheath was retracted over wire exchanged for a short 7 French sheath and a minx device was deployed.  He tolerated procedure well without any complication. Contrast: 70cc EBL: 200cc Brandon C. Donzetta Matters, MD [facility] Office: [phone] Pager: [phone]   CT Angio Chest/Abd/Pel for Dissection W and/or W/WO  Result Date: 08/25/2022 CLINICAL DATA:  Abdominal pain radiating to both flanks since early this morning. Denies chest pain. EXAM: CT ANGIOGRAPHY CHEST, ABDOMEN AND PELVIS TECHNIQUE: Non-contrast CT of the chest was initially obtained. Multidetector CT imaging through the chest, abdomen and pelvis was performed using the standard protocol during bolus administration of intravenous contrast. Multiplanar reconstructed  images and MIPs were obtained and reviewed to evaluate the vascular anatomy. RADIATION DOSE REDUCTION: This exam was performed according to the departmental dose-optimization program which includes automated exposure control, adjustment of the mA and/or kV according to patient size and/or use of iterative reconstruction technique. CONTRAST:  19m OMNIPAQUE IOHEXOL 350 MG/ML SOLN COMPARISON:  Chest x-ray from same day. FINDINGS: CTA CHEST FINDINGS Cardiovascular: No thoracic aorta intramural hematoma. Preferential opacification of the thoracic aorta. No evidence of thoracic aortic aneurysm or dissection. Coronary, aortic arch, and branch vessel atherosclerotic vascular disease. Mild cardiomegaly with biatrial enlargement. No pericardial effusion. No pulmonary embolism. Mediastinum/Nodes: No enlarged mediastinal, hilar, or axillary lymph nodes. Patulous esophagus. Thyroid gland and trachea demonstrate no significant findings. Lungs/Pleura: No focal consolidation, pleural effusion, or pneumothorax. Musculoskeletal: No chest wall abnormality. No acute or significant osseous findings. Review of the MIP images confirms the above findings. CTA ABDOMEN AND PELVIS FINDINGS VASCULAR Aorta: Normal caliber aorta without aneurysm, dissection, vasculitis or significant stenosis. Moderate to severe atherosclerotic calcification. Celiac: Patent without  evidence of aneurysm, dissection, vasculitis or significant stenosis. 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 Reconstitution of distal branch vessels. Renals: Both renal arteries are patent without evidence of aneurysm, dissection, vasculitis, fibromuscular dysplasia or significant stenosis. IMA: Patent without evidence of aneurysm, dissection, vasculitis or significant stenosis. Inflow: Patent without evidence of aneurysm, dissection, vasculitis or significant stenosis.  Veins: No obvious venous abnormality within the limitations of this arterial phase study. Review of the MIP images confirms the above findings. NON-VASCULAR Hepatobiliary: No focal liver abnormality is seen. No gallstones, gallbladder wall thickening, or biliary dilatation. Pancreas: Unremarkable. No pancreatic ductal dilatation or surrounding inflammatory changes. Spleen: Normal in size without focal abnormality. Adrenals/Urinary Tract: Adrenal glands are unremarkable. Kidneys are normal, without renal calculi, solid lesion, or hydronephrosis. Simple cyst in the left kidney measuring up to 1.6 cm. No follow-up imaging is recommended. Bladder is unremarkable. Stomach/Bowel: Stomach is within normal limits. Appendix appears normal. No evidence of bowel wall thickening, distention, or inflammatory changes. No pneumatosis. Left-sided colonic diverticulosis. Lymphatic: No enlarged abdominal or pelvic lymph nodes. Reproductive: Prostate is unremarkable. Other: No abdominal wall hernia or abnormality. No abdominopelvic ascites. No pneumoperitoneum. Musculoskeletal: No acute or significant osseous findings. Review of the MIP images confirms the above findings. IMPRESSION: 1. 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 Reconstitution of distal branch vessels. No CT evidence of bowel ischemia at this time. 2. No evidence of aortic aneurysm or dissection. 3. Aortic Atherosclerosis (ICD10-I70.0). Critical Value/emergent results were called by telephone at the time of interpretation on 07/26/2022 at 11:17 am to provider [facility] , who verbally acknowledged these results. Electronically Signed   By: Titus Dubin M.D.   On: 08/06/2022 11:19   DG Chest Port 1 View  Result Date: 08/25/2022 CLINICAL DATA:  Abdominal pain, back pain EXAM: PORTABLE CHEST 1 VIEW COMPARISON:  07/09/2014 FINDINGS: Transverse diameter heart is slightly increased. Thoracic aorta is tortuous.  There are no signs of pulmonary edema or focal pulmonary consolidation. There is no pleural effusion or pneumothorax. IMPRESSION: No active disease. Electronically Signed   By: Elmer Picker M.D.   On: 07/28/2022 10:18    Anti-infectives: Anti-infectives (From admission, onward)    Start     Dose/Rate Route Frequency Ordered Stop   08/19/2022 1230  cefoTEtan (CEFOTAN) 2 g in sodium chloride 0.9 % 100 mL IVPB        2 g 200 mL/hr over 30 Minutes Intravenous Every 12 hours 08/19/2022 1229     08/19/2022 1130  piperacillin-tazobactam (ZOSYN) IVPB 3.375 g        3.375 g 100 mL/hr over 30 Minutes Intravenous  Once 08/18/2022 1118 07/31/2022 1152        Assessment/Plan Acute mesenteric ischemia s/p SMA thrombectomy 08/06/2022 Dr. Donzetta Matters  Small bowel ischemia  s/p exploratory laparotomy, SBR (left is discontinuity), placement abthera [facility] 8/8 Dr. Rosendo Gros - afebrile, CCM following and weaning pressors as able  - tentative plan to take back to OR tomorrow 8/10 for second look laparotomy, possible SB anastomosis, possible abdominal closure  - on hep gtt, will hold this 4 hours before OR   LOS: 1 day   I reviewed nursing notes, Consultant CCM notes, hospitalist notes, last 24 h vitals and pain scores, last 48 h intake and output, last 24 h labs and trends, and last 24 h imaging results.  Obie Dredge, PA-C [facility] Please see Amion for pager number during day hours 7:00am-4:30pm

## 2022-08-03 NOTE — Progress Notes (Addendum)
ANTICOAGULATION CONSULT NOTE   Pharmacy Consult for IV heparin Indication:  SMA   occlusion s/p thrombectomy  No Known Allergies  Patient Measurements: Height: '5\' 10"'$  (177.8 cm) Weight: 68 kg (149 lb 14.6 oz) IBW/kg (Calculated) : 73 Heparin Dosing Weight: 68 kg  Vital Signs: Temp: 97 F (36.1 C) (08/09 1118) Temp Source: Core (08/09 1118) BP: 100/61 (08/09 1330) Pulse Rate: 92 (08/09 1330)  Labs: Recent Labs    08/24/2022 1003 08/13/2022 1003 08/10/2022 1032 08/21/2022 1133 07/31/2022 1144 07/31/2022 1203 08/25/2022 1954 07/29/2022 0314 08/17/2022 1238 08/16/2022 1735 08/03/22 0340 08/03/22 0746 08/03/22 1304  HGB 16.1  --    < >  --   --   --  14.6 13.0   < > 9.8* 9.8* 8.8*  --   HCT 47.7  --    < >  --   --   --  44.1 40.1   < > 28.7* 28.4* 26.0*  --   PLT 136*  --   --   --   --   --  105* 100*  --  71* 67*  --   --   APTT  --   --   --   --  34  --   --   --   --   --   --   --   --   LABPROT  --   --   --  14.6  --   --   --   --   --   --   --   --   --   INR  --   --   --  1.2  --   --   --   --   --   --   --   --   --   HEPARINUNFRC  --    < >  --   --  <0.10*  --   --  >1.10*  --   --  0.29*  --  0.24*  CREATININE 1.32*  --    < >  --   --   --  1.45*  --   --  1.99* 1.96*  --   --   TROPONINIHS 6  --   --   --   --  8  --   --   --   --   --   --   --    < > = values in this interval not displayed.     Estimated Creatinine Clearance: 22.2 mL/min (A) (by C-G formula based on SCr of 1.96 mg/dL (H)).   Assessment: Pharmacy is consulted to dose heparin in 88 yo male diagnosed with SMA occlusion.  Per med rec, Pt was on Eliquis previously for afib but had stopped taking the med about 3 years ago due to rectal bleeding and bruising (baseline aPTT 34 sec).  PMH includes Ischemic left leg that required Iliofemoral thromboembolectomy in 2019.    S/p mechanical thrombectomy 8/7, now s/p exploratory laparotomy with small bowel resection and wound VAC placement by surgery on 8/8.  Heparin level came back subtherapeutic at 0.24, on 850 units/hr. Hgb 8.8, plt down to 67. Plans for 2nd look tomorrow with surgery. No s/sx of bleeding or infusion issues.    Goal of Therapy:  Heparin level 0.3-0.7 units/ml Monitor platelets by anticoagulation protocol: Yes   Plan:  Increase heparin infusion to 1000 units/hr Obtain heparin level 8 hours  F/u long-term AC plans  Antonietta Jewel, PharmD, [place] Clinical Pharmacist  Phone: [phone] 08/03/2022 2:15 PM  Please check AMION for all [place] phone numbers After 10:00 PM, call [place] [phone]   Addendum 1830: Patients schedule for surgery at 8/10 @ 0845. Per PA note, hold heparin 4-hours prior to surgery.   Plan:  Will check a level tonight at 2200  Stop time of 8/10 0445 added to heparin order    Vicenta Dunning, PharmD  PGY1 Pharmacy Resident

## 2022-08-04 ENCOUNTER — Inpatient Hospital Stay (HOSPITAL_COMMUNITY): Payer: Medicare Other | Admitting: Anesthesiology

## 2022-08-04 ENCOUNTER — Inpatient Hospital Stay (HOSPITAL_COMMUNITY): Payer: Medicare Other

## 2022-08-04 ENCOUNTER — Encounter (HOSPITAL_COMMUNITY): Payer: Self-pay | Admitting: Vascular Surgery

## 2022-08-04 ENCOUNTER — Other Ambulatory Visit: Payer: Self-pay

## 2022-08-04 ENCOUNTER — Encounter (HOSPITAL_COMMUNITY): Admission: EM | Disposition: E | Payer: Self-pay | Source: Home / Self Care | Attending: Vascular Surgery

## 2022-08-04 DIAGNOSIS — S31109A Unspecified open wound of abdominal wall, unspecified quadrant without penetration into peritoneal cavity, initial encounter: Secondary | ICD-10-CM | POA: Diagnosis not present

## 2022-08-04 DIAGNOSIS — I080 Rheumatic disorders of both mitral and aortic valves: Secondary | ICD-10-CM

## 2022-08-04 DIAGNOSIS — I10 Essential (primary) hypertension: Secondary | ICD-10-CM | POA: Diagnosis not present

## 2022-08-04 DIAGNOSIS — K559 Vascular disorder of intestine, unspecified: Secondary | ICD-10-CM

## 2022-08-04 DIAGNOSIS — K551 Chronic vascular disorders of intestine: Secondary | ICD-10-CM | POA: Diagnosis not present

## 2022-08-04 HISTORY — PX: LAPAROTOMY: SHX154

## 2022-08-04 HISTORY — PX: BOWEL RESECTION: SHX1257

## 2022-08-04 LAB — COMPREHENSIVE METABOLIC PANEL
ALT: 22 U/L (ref 0–44)
AST: 32 U/L (ref 15–41)
Albumin: 2.1 g/dL — ABNORMAL LOW (ref 3.5–5.0)
Alkaline Phosphatase: 67 U/L (ref 38–126)
Anion gap: 9 (ref 5–15)
BUN: 28 mg/dL — ABNORMAL HIGH (ref 8–23)
CO2: 23 mmol/L (ref 22–32)
Calcium: 6.9 mg/dL — ABNORMAL LOW (ref 8.9–10.3)
Chloride: 105 mmol/L (ref 98–111)
Creatinine, Ser: 1.82 mg/dL — ABNORMAL HIGH (ref 0.61–1.24)
GFR, Estimated: 34 mL/min — ABNORMAL LOW (ref >60)
Glucose, Bld: 139 mg/dL — ABNORMAL HIGH (ref 70–99)
Potassium: 3 mmol/L — ABNORMAL LOW (ref 3.5–5.1)
Sodium: 137 mmol/L (ref 135–145)
Total Bilirubin: 1.8 mg/dL — ABNORMAL HIGH (ref 0.3–1.2)
Total Protein: 4.6 g/dL — ABNORMAL LOW (ref 6.5–8.1)

## 2022-08-04 LAB — BASIC METABOLIC PANEL
Anion gap: 10 (ref 5–15)
BUN: 28 mg/dL — ABNORMAL HIGH (ref 8–23)
CO2: 23 mmol/L (ref 22–32)
Calcium: 6.9 mg/dL — ABNORMAL LOW (ref 8.9–10.3)
Chloride: 104 mmol/L (ref 98–111)
Creatinine, Ser: 1.59 mg/dL — ABNORMAL HIGH (ref 0.61–1.24)
GFR, Estimated: 40 mL/min — ABNORMAL LOW (ref >60)
Glucose, Bld: 131 mg/dL — ABNORMAL HIGH (ref 70–99)
Potassium: 3.5 mmol/L (ref 3.5–5.1)
Sodium: 137 mmol/L (ref 135–145)

## 2022-08-04 LAB — CBC
HCT: 25.8 % — ABNORMAL LOW (ref 39.0–52.0)
Hemoglobin: 9.3 g/dL — ABNORMAL LOW (ref 13.0–17.0)
MCH: 30 pg (ref 26.0–34.0)
MCHC: 36 g/dL (ref 30.0–36.0)
MCV: 83.2 fL (ref 80.0–100.0)
Platelets: 70 10*3/uL — ABNORMAL LOW (ref 150–400)
RBC: 3.1 MIL/uL — ABNORMAL LOW (ref 4.22–5.81)
RDW: 13.7 % (ref 11.5–15.5)
WBC: 24.3 10*3/uL — ABNORMAL HIGH (ref 4.0–10.5)
nRBC: 0 % (ref 0.0–0.2)

## 2022-08-04 LAB — GLUCOSE, CAPILLARY
Glucose-Capillary: 107 mg/dL — ABNORMAL HIGH (ref 70–99)
Glucose-Capillary: 114 mg/dL — ABNORMAL HIGH (ref 70–99)
Glucose-Capillary: 131 mg/dL — ABNORMAL HIGH (ref 70–99)
Glucose-Capillary: 132 mg/dL — ABNORMAL HIGH (ref 70–99)
Glucose-Capillary: 151 mg/dL — ABNORMAL HIGH (ref 70–99)

## 2022-08-04 LAB — MAGNESIUM
Magnesium: 2.6 mg/dL — ABNORMAL HIGH (ref 1.7–2.4)
Magnesium: 2.5 mg/dL — ABNORMAL HIGH (ref 1.7–2.4)

## 2022-08-04 LAB — SURGICAL PATHOLOGY

## 2022-08-04 LAB — LACTIC ACID, PLASMA: Lactic Acid, Venous: 3.5 mmol/L (ref 0.5–1.9)

## 2022-08-04 SURGERY — LAPAROTOMY, EXPLORATORY
Anesthesia: General | Site: Abdomen

## 2022-08-04 MED ORDER — INSULIN ASPART 100 UNIT/ML IJ SOLN
0.0000 [IU] | Freq: Four times a day (QID) | INTRAMUSCULAR | Status: DC
  Administered 2022-08-04: 2 [IU] via SUBCUTANEOUS
  Administered 2022-08-05: 1 [IU] via SUBCUTANEOUS
  Administered 2022-08-05: 2 [IU] via SUBCUTANEOUS
  Administered 2022-08-05 (×2): 1 [IU] via SUBCUTANEOUS
  Administered 2022-08-06: 2 [IU] via SUBCUTANEOUS
  Administered 2022-08-06: 1 [IU] via SUBCUTANEOUS

## 2022-08-04 MED ORDER — DEXAMETHASONE SODIUM PHOSPHATE 10 MG/ML IJ SOLN
INTRAMUSCULAR | Status: DC | PRN
  Administered 2022-08-04: 5 mg via INTRAVENOUS

## 2022-08-04 MED ORDER — POTASSIUM CHLORIDE 20 MEQ PO PACK: 20.0000 meq | PACK | ORAL | Status: DC

## 2022-08-04 MED ORDER — ROCURONIUM BROMIDE 10 MG/ML (PF) SYRINGE
PREFILLED_SYRINGE | INTRAVENOUS | Status: DC | PRN
  Administered 2022-08-04: 20 mg via INTRAVENOUS
  Administered 2022-08-04: 50 mg via INTRAVENOUS

## 2022-08-04 MED ORDER — HEPARIN (PORCINE) 25000 UT/250ML-% IV SOLN
1100.0000 [IU]/h | INTRAVENOUS | Status: DC
Start: 2022-08-04 — End: 2022-08-04

## 2022-08-04 MED ORDER — POTASSIUM CHLORIDE 10 MEQ/100ML IV SOLN
10.0000 meq | INTRAVENOUS | Status: AC
  Administered 2022-08-04 (×4): 10 meq via INTRAVENOUS
  Filled 2022-08-04 (×4): qty 100

## 2022-08-04 MED ORDER — TRAVASOL 10 % IV SOLN
INTRAVENOUS | Status: AC
  Filled 2022-08-04: qty 556.8

## 2022-08-04 MED ORDER — FENTANYL CITRATE (PF) 250 MCG/5ML IJ SOLN
INTRAMUSCULAR | Status: AC
  Filled 2022-08-04: qty 5

## 2022-08-04 MED ORDER — HEPARIN (PORCINE) 25000 UT/250ML-% IV SOLN
1250.0000 [IU]/h | INTRAVENOUS | Status: DC
  Administered 2022-08-04: 1100 [IU]/h via INTRAVENOUS
  Administered 2022-08-05 – 2022-08-06 (×2): 1250 [IU]/h via INTRAVENOUS
  Filled 2022-08-04 (×2): qty 250

## 2022-08-04 MED ORDER — 0.9 % SODIUM CHLORIDE (POUR BTL) OPTIME
TOPICAL | Status: DC | PRN
  Administered 2022-08-04: 1000 mL

## 2022-08-04 MED ORDER — LACTATED RINGERS IV SOLN: INTRAVENOUS | Status: DC | PRN

## 2022-08-04 MED ORDER — POTASSIUM CHLORIDE 20 MEQ PO PACK
40.0000 meq | PACK | Freq: Once | ORAL | Status: DC
  Filled 2022-08-04: qty 2

## 2022-08-04 MED ORDER — FENTANYL CITRATE (PF) 250 MCG/5ML IJ SOLN
INTRAMUSCULAR | Status: DC | PRN
  Administered 2022-08-04 (×2): 50 ug via INTRAVENOUS

## 2022-08-04 MED ORDER — POTASSIUM CHLORIDE 10 MEQ/50ML IV SOLN
10.0000 meq | INTRAVENOUS | Status: AC
  Administered 2022-08-04 (×5): 10 meq via INTRAVENOUS
  Filled 2022-08-04 (×4): qty 50

## 2022-08-04 MED ORDER — POTASSIUM CHLORIDE 10 MEQ/50ML IV SOLN
10.0000 meq | INTRAVENOUS | Status: DC
  Filled 2022-08-04: qty 50

## 2022-08-04 MED ORDER — DEXTROSE IN LACTATED RINGERS 5 % IV SOLN: INTRAVENOUS | Status: DC

## 2022-08-04 MED ORDER — PROPOFOL 10 MG/ML IV BOLUS
INTRAVENOUS | Status: AC
  Filled 2022-08-04: qty 20

## 2022-08-04 SURGICAL SUPPLY — 45 items
BLADE CLIPPER SURG (BLADE) IMPLANT
CANISTER SUCT 3000ML PPV (MISCELLANEOUS) ×2 IMPLANT
CHLORAPREP W/TINT 26 (MISCELLANEOUS) ×2 IMPLANT
COVER SURGICAL LIGHT HANDLE (MISCELLANEOUS) ×2 IMPLANT
DRAPE LAPAROSCOPIC ABDOMINAL (DRAPES) ×2 IMPLANT
DRAPE WARM FLUID 44X44 (DRAPES) ×2 IMPLANT
DRSG OPSITE POSTOP 4X10 (GAUZE/BANDAGES/DRESSINGS) IMPLANT
DRSG OPSITE POSTOP 4X8 (GAUZE/BANDAGES/DRESSINGS) IMPLANT
ELECT BLADE 6.5 EXT (BLADE) IMPLANT
ELECT CAUTERY BLADE 6.4 (BLADE) ×2 IMPLANT
ELECT REM PT RETURN 9FT ADLT (ELECTROSURGICAL) ×2
ELECTRODE REM PT RTRN 9FT ADLT (ELECTROSURGICAL) ×1 IMPLANT
GLOVE BIO SURGEON STRL SZ7.5 (GLOVE) ×2 IMPLANT
GLOVE BIOGEL PI IND STRL 8 (GLOVE) ×1 IMPLANT
GLOVE BIOGEL PI INDICATOR 8 (GLOVE) ×2
GLOVE SURG SYN 7.5  E (GLOVE) ×2
GLOVE SURG SYN 7.5 E (GLOVE) ×1 IMPLANT
GLOVE SURG SYN 7.5 PF PI (GLOVE) ×1 IMPLANT
GOWN STRL REUS W/ TWL LRG LVL3 (GOWN DISPOSABLE) ×1 IMPLANT
GOWN STRL REUS W/ TWL XL LVL3 (GOWN DISPOSABLE) ×1 IMPLANT
GOWN STRL REUS W/TWL LRG LVL3 (GOWN DISPOSABLE) ×2
GOWN STRL REUS W/TWL XL LVL3 (GOWN DISPOSABLE) ×2
HANDLE SUCTION POOLE (INSTRUMENTS) ×1 IMPLANT
KIT BASIN OR (CUSTOM PROCEDURE TRAY) ×2 IMPLANT
KIT TURNOVER KIT B (KITS) ×2 IMPLANT
LIGASURE IMPACT 36 18CM CVD LR (INSTRUMENTS) ×1 IMPLANT
NS IRRIG 1000ML POUR BTL (IV SOLUTION) ×4 IMPLANT
PACK GENERAL/GYN (CUSTOM PROCEDURE TRAY) ×2 IMPLANT
PAD ARMBOARD 7.5X6 YLW CONV (MISCELLANEOUS) ×2 IMPLANT
PENCIL SMOKE EVACUATOR (MISCELLANEOUS) ×2 IMPLANT
RELOAD PROXIMATE 75MM BLUE (ENDOMECHANICALS) ×6 IMPLANT
RELOAD STAPLE 75 3.8 BLU REG (ENDOMECHANICALS) IMPLANT
SPECIMEN JAR LARGE (MISCELLANEOUS) IMPLANT
SPONGE T-LAP 18X18 ~~LOC~~+RFID (SPONGE) IMPLANT
STAPLER PROXIMATE 75MM BLUE (STAPLE) ×1 IMPLANT
STAPLER VISISTAT 35W (STAPLE) ×2 IMPLANT
SUCTION POOLE HANDLE (INSTRUMENTS) ×2
SUT PDS AB 1 TP1 54 (SUTURE) ×4 IMPLANT
SUT SILK 2 0 SH CR/8 (SUTURE) ×2 IMPLANT
SUT SILK 2 0 TIES 10X30 (SUTURE) ×2 IMPLANT
SUT SILK 3 0 SH CR/8 (SUTURE) ×2 IMPLANT
SUT SILK 3 0 TIES 10X30 (SUTURE) ×2 IMPLANT
TOWEL GREEN STERILE (TOWEL DISPOSABLE) ×2 IMPLANT
TRAY FOLEY MTR SLVR 16FR STAT (SET/KITS/TRAYS/PACK) ×2 IMPLANT
YANKAUER SUCT BULB TIP NO VENT (SUCTIONS) IMPLANT

## 2022-08-04 NOTE — Transfer of Care (Signed)
Immediate Anesthesia Transfer of Care Note  Patient: George Potter  Procedure(s) Performed: EXPLORATORY LAPAROTOMY (Abdomen) SMALL BOWEL RESECTION (Abdomen)  Patient Location: ICU  Anesthesia Type:General  Level of Consciousness: sedated and Patient remains intubated per anesthesia plan  Airway & Oxygen Therapy: Patient remains intubated per anesthesia plan and Patient placed on Ventilator (see vital sign flow sheet for setting)  Post-op Assessment: Report given to RN and Post -op Vital signs reviewed and stable  Post vital signs: Reviewed and stable  Last Vitals:  Vitals Value Taken Time  BP    Temp    Pulse 66 07/28/2022 1149  Resp 24 08/25/2022 1149  SpO2 100 % 08/14/2022 1149  Vitals shown include unvalidated device data.  Last Pain:  Vitals:   08/09/2022 0330  TempSrc: Esophageal  PainSc:          Complications: No notable events documented.

## 2022-08-04 NOTE — Anesthesia Preprocedure Evaluation (Addendum)
Anesthesia Evaluation  Patient identified by MRN, date of birth, ID band Patient unresponsive    Reviewed: Allergy & Precautions, NPO status , Patient's Chart, lab work & pertinent test results  Airway Mallampati: Intubated  TM Distance: >3 FB Neck ROM: Full    Dental   Pulmonary neg pulmonary ROS,    breath sounds clear to auscultation + decreased breath sounds      Cardiovascular hypertension, Pt. on medications pulmonary hypertension+ Peripheral Vascular Disease  + dysrhythmias Atrial Fibrillation + Valvular Problems/Murmurs MR and AI  Rhythm:Regular Rate:Normal  Echo - 09/2018  Left ventricle: The cavity size was normal. Wall thickness was increased in a pattern of mild LVH. There was severe focal basal hypertrophy of the septum. Systolic function was normal. The estimated ejection fraction was in the range of 60% to 65%. Wall motion was normal; there were no regional wall motion abnormalities. The study is not technically sufficient to allow evaluation of LV diastolic function.  - Aortic valve: Trileaflet. Sclerosis without stenosis. There was mild regurgitation.  - Mitral valve: Calcified annulus. Mildly thickened leaflets .  There was moderate regurgitation.  - Left atrium: The atrium was normal in size.  - Right atrium: The atrium was mildly dilated.  - Tricuspid valve: There was moderate regurgitation.  - Pulmonary arteries: PA peak pressure: 63 mm Hg (S).  - Inferior vena cava: The vessel was dilated. The respirophasic diameter changes were blunted (< 50%), consistent with elevatedcentral venous pressure.   Impressions:   - Compared to a prior study in 2018, the LVEF is stable. RVSP is increased from 47 mmHg to 63 mmHg.     Neuro/Psych negative neurological ROS  negative psych ROS   GI/Hepatic Neg liver ROS, GERD  Medicated,  Endo/Other  negative endocrine ROS  Renal/GU negative Renal ROS      Musculoskeletal negative musculoskeletal ROS (+)   Abdominal   Peds  Hematology negative hematology ROS (+)   Anesthesia Other Findings   Reproductive/Obstetrics                           Lab Results  Component Value Date   WBC 24.3 (H) 08/16/2022   HGB 9.3 (L) 07/31/2022   HCT 25.8 (L) 08/19/2022   MCV 83.2 08/21/2022   PLT 70 (L) 08/06/2022   Lab Results  Component Value Date   CREATININE 1.82 (H) 07/29/2022   BUN 28 (H) 08/08/2022   NA 137 08/17/2022   K 3.0 (L) 08/14/2022   CL 105 08/11/2022   CO2 23 08/19/2022   Lab Results  Component Value Date   INR 1.2 08/21/2022   INR 1.0 07/22/2008   EKG: atrial fibrillation.   Anesthesia Physical  Anesthesia Plan  ASA: 4  Anesthesia Plan: General   Post-op Pain Management: Ofirmev IV (intra-op)*   Induction: Inhalational  PONV Risk Score and Plan: 2 and Ondansetron, Treatment may vary due to age or medical condition and Midazolam  Airway Management Planned: Oral ETT  Additional Equipment: Arterial line  Intra-op Plan:   Post-operative Plan: Post-operative intubation/ventilation  Informed Consent: I have reviewed the patients History and Physical, chart, labs and discussed the procedure including the risks, benefits and alternatives for the proposed anesthesia with the patient or authorized representative who has indicated his/her understanding and acceptance.     Dental advisory given and Consent reviewed with POA  Plan Discussed with: CRNA  Anesthesia Plan Comments:       Anesthesia  Quick Evaluation

## 2022-08-04 NOTE — Progress Notes (Signed)
[place] for IV heparin Indication:  SMA   occlusion s/p thrombectomy  No Known Allergies  Patient Measurements: Height: '5\' 10"'$  (177.8 cm) Weight: 68 kg (149 lb 14.6 oz) IBW/kg (Calculated) : 73 Heparin Dosing Weight: 68 kg  Vital Signs: Temp: 97.5 F (36.4 C) (08/10 1200) Temp Source: Oral (08/10 1200) BP: 132/66 (08/10 0805) Pulse Rate: 75 (08/10 1300)  Labs: Recent Labs    08/12/2022 1735 08/03/22 0340 08/03/22 0746 08/03/22 1304 08/03/22 2010 07/31/2022 0344  HGB 9.8* 9.8* 8.8*  --   --  9.3*  HCT 28.7* 28.4* 26.0*  --   --  25.8*  PLT 71* 67*  --   --   --  70*  HEPARINUNFRC  --  0.29*  --  0.24* 0.23*  --   CREATININE 1.99* 1.96*  --   --   --  1.82*     Estimated Creatinine Clearance: 23.9 mL/min (A) (by C-G formula based on SCr of 1.82 mg/dL (H)).   Assessment: Pharmacy is consulted to dose heparin in 88 yo male diagnosed with SMA occlusion.  Per med rec, Pt was on Eliquis previously for afib but had stopped taking the med about 3 years ago due to rectal bleeding and bruising (baseline aPTT 34 sec).  PMH includes Ischemic left leg that required Iliofemoral thromboembolectomy in 2019.    S/p mechanical thrombectomy 8/7, now s/p exploratory laparotomy with small bowel resection and wound VAC placement by surgery on 8/8. Underwent 2nd look exploratory laparotomy 8/10 - okay per surgery to restart heparin 4 hours after procedure. Hgb 9.3, plt 70. No s/sx of bleeding or infusion issues.    Goal of Therapy:  Heparin level 0.3-0.7 units/ml Monitor platelets by anticoagulation protocol: Yes   Plan:  Restart heparin infusion at 1000 units/hr on 8/10'@1600'$  Obtain heparin level 8 hours  F/u long-term AC plans  Antonietta Jewel, PharmD, [place] Pharmacist  Phone: [phone] 08/06/2022 2:19 PM  Please check AMION for all [place] phone numbers After 10:00 PM, call [place] [phone]

## 2022-08-04 NOTE — Progress Notes (Addendum)
[facility] Progress Note  2 Days Post-Op  Subjective: CC:  Intubated, sedated  On 45 of neo, no other pressors  Hep gtt running  Objective: Vital signs in last 24 hours: Temp:  [96.1 F (35.6 C)-97.9 F (36.6 C)] 97.9 F (36.6 C) (08/10 0330) Pulse Rate:  [60-92] 85 (08/10 0645) Resp:  [16-19] 19 (08/10 0645) BP: (85-143)/(50-97) 142/77 (08/10 0645) SpO2:  [97 %-100 %] 97 % (08/10 0645) Arterial Line BP: (87-154)/(41-76) 152/67 (08/10 0645) FiO2 (%):  [30 %-32 %] 30 % (08/10 0308) Last BM Date : 08/03/2022  Intake/Output from previous day: 08/09 0701 - 08/10 0700 In: 3667.7 [I.V.:3237.1; IV Piggyback:430.6] Out: 1010 [Urine:465; Emesis/NG output:250; Drains:295] Intake/Output this shift: No intake/output data recorded.  PE: Gen:  critically ill appearing male Card:  irregular, regular rate Pulm:  ventilated respirations Abd: Soft, abthera VAC holding suction, small amt SS drainage in cannister Skin: warm and dry, no rashes  Psych: unable to assess  Lab Results:  Recent Labs    08/03/22 0340 08/03/22 0746 07/30/2022 0344  WBC 27.6*  --  24.3*  HGB 9.8* 8.8* 9.3*  HCT 28.4* 26.0* 25.8*  PLT 67*  --  70*   BMET Recent Labs    08/03/22 0340 08/03/22 0746 08/14/2022 0344  NA 137 140 137  K 3.7 3.6 3.0*  CL 108  --  105  CO2 18*  --  23  GLUCOSE 99  --  139*  BUN 27*  --  28*  CREATININE 1.96*  --  1.82*  CALCIUM 7.4*  --  6.9*   PT/INR Recent Labs    07/26/2022 1133  LABPROT 14.6  INR 1.2   CMP     Component Value Date/Time   NA 137 08/12/2022 0344   K 3.0 (L) 08/17/2022 0344   CL 105 08/22/2022 0344   CO2 23 08/11/2022 0344   GLUCOSE 139 (H) 08/20/2022 0344   BUN 28 (H) 08/10/2022 0344   CREATININE 1.82 (H) 08/23/2022 0344   CALCIUM 6.9 (L) 08/12/2022 0344   PROT 4.6 (L) 08/13/2022 0344   ALBUMIN 2.1 (L) 08/20/2022 0344   AST 32 08/03/2022 0344   ALT 22 08/13/2022 0344   ALKPHOS 67 08/18/2022 0344   BILITOT 1.8 (H) 08/08/2022  0344   GFRNONAA 34 (L) 08/25/2022 0344   GFRAA 50 (L) 09/25/2019 0413   Lipase     Component Value Date/Time   LIPASE 39 08/11/2022 1003       Studies/Results: DG Chest 1 View  Result Date: 08/03/2022 CLINICAL DATA:  88 year old male central line placement. EXAM: CHEST  1 VIEW COMPARISON:  Portable chest 08/24/2022. FINDINGS: Portable AP semi upright view at 0941 hours. Rotated to the left now. New left subclavian versus IJ approach central line in place. Tip projects at the level of the cavoatrial junction. Stable cardiac size and mediastinal contours. Endotracheal tube and enteric tube appear stable, enteric tube side hole still projects near the level of the diaphragm. No pneumothorax, pulmonary edema or definite effusion. Increased left lung base opacity but no air bronchograms. IMPRESSION: 1. Left IJ approach central line placed with tip at the cavoatrial junction level. No pneumothorax. 2. Stable ET tube. Note that enteric tube side hole remains near the level of the diaphragm. Advance the enteric tube 5 cm to ensure side hole placement within the stomach. 3. Increased left lung base opacity, favor atelectasis. Electronically Signed   By: Genevie Ann M.D.   On: 08/03/2022 09:50  DG Chest 1 View  Result Date: 08/25/2022 CLINICAL DATA:  NG tube placement EXAM: CHEST  1 VIEW COMPARISON:  Chest x-ray dated August 01, 2022 FINDINGS: NG tube courses below the diaphragm with tip in the stomach and side port near the GE junction. ETT tip is approximately 4.5 cm from the carina. Cardiac and mediastinal contours are unchanged. Mild left basilar opacities, likely due to atelectasis. No large pleural effusion or evidence of pneumothorax. IMPRESSION: NG tube tip is in the stomach and with side port near the GE junction, recommend advancement for optimal positioning. Electronically Signed   By: Yetta Glassman M.D.   On: 07/31/2022 15:02    Anti-infectives: Anti-infectives (From admission, onward)     Start     Dose/Rate Route Frequency Ordered Stop   07/31/2022 1230  cefoTEtan (CEFOTAN) 2 g in sodium chloride 0.9 % 100 mL IVPB        2 g 200 mL/hr over 30 Minutes Intravenous Every 12 hours 08/09/2022 1229     08/19/2022 1130  piperacillin-tazobactam (ZOSYN) IVPB 3.375 g        3.375 g 100 mL/hr over 30 Minutes Intravenous  Once 08/03/2022 1118 08/11/2022 1152        Assessment/Plan Acute mesenteric ischemia s/p SMA thrombectomy 08/24/2022 Dr. Donzetta Matters  Small bowel ischemia  s/p exploratory laparotomy, SBR (left is discontinuity), placement abthera VAC 8/8 Dr. Rosendo Gros - afebrile, hgb stable, K 3.0 and being repleted now.  -  plan take back to OR today for second look laparotomy, possible SB anastomosis, possible abdominal closure; I discussed this plan with the patients son, [name], who gave consent - on hep gtt stopped 0445  - will likely need TPN post-op   LOS: 2 days   I reviewed nursing notes, Consultant CCM notes, hospitalist notes, last 24 h vitals and pain scores, last 48 h intake and output, last 24 h labs and trends, and last 24 h imaging results.    Obie Dredge, PA-C [facility] Please see Amion for pager number during day hours 7:00am-4:30pm

## 2022-08-04 NOTE — Progress Notes (Signed)
Pt. Off unit at 1030. Picked up by OR. VSS.

## 2022-08-04 NOTE — Progress Notes (Signed)
NAME:  George Potter, MRN:  [id number], DOB:  February 12, 1926, LOS: 2 ADMISSION DATE:  08/19/2022, CONSULTATION DATE:  08/06/2022 REFERRING MD:  Laneta Simmers, MD, CHIEF COMPLAINT:  abd pain   History of Present Illness:  Patient is a 88 year old male with a hx chronic  mesenteric ischemia and afib (not on AC past 3 years due to rectal bleeding and bruising) and PAD with prior ischemic left leg (required iliofemoral thrombectomy by Dr. Trula Slade 10/14/2018) and HTN presented 8/7 to [facility] with complaints of abdominal pain onset 30 minutes after eating honey bun and coffee. Pain most severe RLQ. Found to have acute occlusion of SMA and was started on heparin and transferred to [facility] for vascular surgery evaluation. He underwent percutaneous thrombectomy of SMA via R femoral on 8/7 with initial symptom improvement. Pain acutely worsened overnight and became constant. Pt noted blood in his stool and inability to move his bowels.   Pertinent  Medical History   Past Medical History:  Diagnosis Date   Atrial fibrillation, chronic (HCC)    a. on Eliquis   Diverticulosis    HTN (hypertension)    Mild   Severe mitral regurgitation      Significant Hospital Events: Including procedures, antibiotic start and stop dates in addition to other pertinent events   8/7 SMA thrombectomy 8/8 ex-lap of necrotic bowel 8/10 back to OR today  Interim History / Subjective:  Remains intubated and sedated without overnight events 465cc UOP On low dose Neosynephrine   Objective   Blood pressure 132/66, pulse 77, temperature 97.9 F (36.6 C), temperature source Esophageal, resp. rate 18, height '5\' 10"'$  (1.778 m), weight 68 kg, SpO2 100 %.    Vent Mode: PRVC FiO2 (%):  [30 %-32 %] 30 % Set Rate:  [18 bmp] 18 bmp Vt Set:  [580 mL] 580 mL PEEP:  [5 cmH20] 5 cmH20 Plateau Pressure:  [14 cmH20-16 cmH20] 14 cmH20   Intake/Output Summary (Last 24 hours) at 07/26/2022 6237 Last data filed at 08/12/2022  0600 Gross per 24 hour  Intake 3218.63 ml  Output 920 ml  Net 2298.63 ml    Filed Weights   08/21/2022 0955 08/11/2022 0834  Weight: 68 kg 68 kg    General:  critically ill-appearing M, intubated and sedated HEENT: MM pink/moist, ETT in place, sclera anicteric Neuro: examined on sedation RASS -4 CV: s1s2 rrr, no m/r/g PULM:  mechanically ventilated with equal chest rise and no rhonchi or wheezing GI: soft, non-distended, midline open abdomen without drainage or erythema Extremities: warm/dry, no edema  Skin: no rashes or lesions  WBC 24k Platelets 70 K 3.0 Creatinine 1.8  Resolved Hospital Problem list     Assessment & Plan:  S/P SMA thrombectomy  for chronic mesenteric ischemia 8/7 S/P Ex lap for ischemic bowel 8/8 Ventilator Dependence - Bowel management, operative/drain plans per CCS -back to OR today, noted small Meckel's diverticulum small bowel removal distal to the Meckel's diverticulum. -Maintain full vent support with SAT/SBT as tolerated -titrate Vent setting to maintain SpO2 greater than or equal to 90%. -HOB elevated 30 degrees. -Plateau pressures less than 30 cm H20.  -Follow chest x-ray, ABG prn.   -Bronchial hygiene and RT/bronchodilator protocol.    Postoperative shock AKI related to fluid shifts, distributive shock Lactic acidemia improved Hypo-Mg, Hypo-K- repleting Ventilator dependence- extubation depending on operative plans Afib on AC - Heparin gtt - Neo to MAP 65 - Usual transfusion thresholds - Avoid nephrotoxins - Vent bundle,  prop/fent to RASS -1 to 0 - Maintenance fluids from D5NS to D5 bicarb - Will follow  Best Practice (right click and "Reselect all SmartList Selections" daily)   Diet/type: NPO DVT prophylaxis: heparin gtt GI prophylaxis: PPI Lines: Arterial Line Foley:  Yes, and it is still needed Code Status:  full code Last date of multidisciplinary goals of care discussion [per primary]  CRITICAL CARE Performed by:  Otilio Carpen Waddell Iten   Total critical care time: 35  minutes  Critical care time was exclusive of separately billable procedures and treating other patients.  Critical care was necessary to treat or prevent imminent or life-threatening deterioration.  Critical care was time spent personally by me on the following activities: development of treatment plan with patient and/or surrogate as well as nursing, discussions with consultants, evaluation of patient's response to treatment, examination of patient, obtaining history from patient or surrogate, ordering and performing treatments and interventions, ordering and review of laboratory studies, ordering and review of radiographic studies, pulse oximetry and re-evaluation of patient's condition.   Otilio Carpen Firman Petrow, PA-C [facility] See Amion for pager If no response to pager , please call [phone] until 7pm After 7:00 pm call Elink  [phone]

## 2022-08-04 NOTE — Progress Notes (Signed)
[facility] ADULT ICU REPLACEMENT PROTOCOL   The patient does apply for the [facility] Adult ICU Electrolyte Replacment Protocol based on the criteria listed below:   1.Exclusion criteria: TCTS patients, ECMO patients, and Dialysis patients 2. Is GFR >/= 30 ml/min? Yes.    Patient's GFR today is 34 3. Is SCr </= 2? Yes.   Patient's SCr is 1.82 mg/dL 4. Did SCr increase >/= 0.5 in 24 hours? No. 5.Pt's weight >40kg  Yes.   6. Abnormal electrolyte(s):   K 3.0  7. Electrolytes replaced per protocol 8.  Call MD STAT for K+ </= 2.5, Phos </= 1, or Mag </= 1 Physician:  S. Rosie Fate R Erva Koke 08/03/2022 5:24 AM

## 2022-08-04 NOTE — Progress Notes (Signed)
ANTICOAGULATION CONSULT NOTE Pharmacy Consult for heparin Indication:  SMA occlusion s/p thrombectomy Brief A/P: Heparin level subtherapeutic Increase Heparin rate  Labs: Recent Labs    08/08/2022 1003 08/09/2022 1003 08/03/2022 1032 08/03/2022 1133 08/24/2022 1144 08/05/2022 1203 08/08/2022 1954 08/22/2022 0314 08/05/2022 1238 07/27/2022 1735 08/03/22 0340 08/03/22 0746 08/03/22 1304 08/03/22 2010  HGB 16.1  --    < >  --   --   --  14.6 13.0   < > 9.8* 9.8* 8.8*  --   --   HCT 47.7  --    < >  --   --   --  44.1 40.1   < > 28.7* 28.4* 26.0*  --   --   PLT 136*  --   --   --   --   --  105* 100*  --  71* 67*  --   --   --   APTT  --   --   --   --  34  --   --   --   --   --   --   --   --   --   LABPROT  --   --   --  14.6  --   --   --   --   --   --   --   --   --   --   INR  --   --   --  1.2  --   --   --   --   --   --   --   --   --   --   HEPARINUNFRC  --    < >  --   --  <0.10*  --   --  >1.10*  --   --  0.29*  --  0.24* 0.23*  CREATININE 1.32*  --    < >  --   --   --  1.45*  --   --  1.99* 1.96*  --   --   --   TROPONINIHS 6  --   --   --   --  8  --   --   --   --   --   --   --   --    < > = values in this interval not displayed.    Assessment: 88 yo male s/p SMA thrombectomy for heparin Goal of Therapy:  Heparin level 0.3-0.7 units/ml   Plan:  Increase Heparin 1100 units/hr F/U after OR   Phillis Knack, PharmD, BCPS

## 2022-08-04 NOTE — Progress Notes (Signed)
[facility] Progress Note Patient Name: George Potter DOB: 1926-05-31 MRN: [id number]   Date of Service  08/20/2022  HPI/Events of Note  Hypokalemia - K+ = 3.0 and Creatinine = 1.82.  eICU Interventions  Will replace K+.      Intervention Category Major Interventions: Electrolyte abnormality - evaluation and management  [name] 08/19/2022, 5:43 AM

## 2022-08-04 NOTE — Progress Notes (Addendum)
  Progress Note    08/06/2022 8:06 AM 2 Days Post-Op  Subjective:  intubated and sedated. Son at bedside   Vitals:   07/28/2022 0630 08/20/2022 0645  BP: 134/74 (!) 142/77  Pulse: 74 85  Resp: 18 19  Temp:    SpO2: 100% 97%   Physical Exam: Cardiac: irregular Lungs:  intubated Incisions:  abdominal incision with VAC to suction, minimal SS output Extremities:  well perfused and warm. Doppler DP signals bilaterally Abdomen:  abdomen soft, non distended Neurologic: sedated  CBC    Component Value Date/Time   WBC 24.3 (H) 08/08/2022 0344   RBC 3.10 (L) 08/16/2022 0344   HGB 9.3 (L) 07/30/2022 0344   HCT 25.8 (L) 08/22/2022 0344   PLT 70 (L) 08/03/2022 0344   MCV 83.2 08/19/2022 0344   MCH 30.0 08/19/2022 0344   MCHC 36.0 08/20/2022 0344   RDW 13.7 08/11/2022 0344   LYMPHSABS 2.6 08/25/2022 1003   MONOABS 0.6 08/24/2022 1003   EOSABS 0.0 08/24/2022 1003   BASOSABS 0.0 08/22/2022 1003    BMET    Component Value Date/Time   NA 137 08/20/2022 0344   K 3.0 (L) 08/15/2022 0344   CL 105 08/18/2022 0344   CO2 23 08/18/2022 0344   GLUCOSE 139 (H) 08/10/2022 0344   BUN 28 (H) 08/17/2022 0344   CREATININE 1.82 (H) 08/06/2022 0344   CALCIUM 6.9 (L) 08/10/2022 0344   GFRNONAA 34 (L) 08/18/2022 0344   GFRAA 50 (L) 09/25/2019 0413    INR    Component Value Date/Time   INR 1.2 08/03/2022 1133     Intake/Output Summary (Last 24 hours) at 08/11/2022 0806 Last data filed at 08/12/2022 0600 Gross per 24 hour  Intake 3326.23 ml  Output 960 ml  Net 2366.23 ml     Assessment/Plan:  88 y.o. male is s/p SMA thrombectomy 3 Day Post and Ex Lap with Small bowel resection for ischemic bowel   2 Days Post-Op   Remains intubated an sedated On Neo Abdomen is soft. Without bowel sounds. VAC to suction, minimal SS output Afebrile. WBC trending down Hypokalemia -Replace K+ Scr stable H&H stable Appreciate assistance by CCS and CCM  Heparin gtt on hold for return to OR this  morning Returning to OR today for second look laparotomy with CCS  Karoline Caldwell, PA-C [facility] [phone] 08/03/2022 8:06 AM  I have independently examined the patient and agree with PA assessment and plan above. Abdomen now closed, guarded prognosis discussed with son at bedside.  General surgery and CCM assistance much appreciated.   Lenda Baratta C. Donzetta Matters, MD [facility] [place] Office: [phone] Pager: [phone]

## 2022-08-04 NOTE — Progress Notes (Signed)
PHARMACY - TOTAL PARENTERAL NUTRITION CONSULT NOTE   Indication:  Status post small bowel resection, bowels in discontinuity  Patient Measurements: Height: '5\' 10"'$  (177.8 cm) Weight: 68 kg (149 lb 14.6 oz) IBW/kg (Calculated) : 73 TPN AdjBW (KG): 68 Body mass index is 21.51 kg/m.  Assessment: 88 years of age male with a history of chronic mesenteric ischemia, atrial fibrillation, hypertension, and PAD with prior ischemic left leg status post thrombectomy in 2019 who presented to [facility] with severe abdominal pain found to have acute SMA occlusion, underwent percutaneous thrombectomy on 8/7 then developed post-operative pain and underwent ex-lap for acute mesenteric ischemia and had small bowel resection and bowel was left in discontinuity with wound vac in place. Patient is back in the OR today for re-exploration with more small bowel removed. Pharmacy consulted to start TPN.   Glucose / Insulin: CBGs <180. No history of diabetes.  Electrolytes: K low at 3 (on sodium bicarbonate which has now stopped; receiving replacement). Corrected Calcium 8.4. Phosphorus slightly low on prior check.  Renal: AoCKD- SCr trending down at 1.82 today (baseline ~ 1.3-1.4). Low UOP ~0.3 mL/kg/hr.  Hepatic: LFTs within normal limits. Tbili elevated but trending down at 1.8.  Intake / Output; MIVF: Net +6.4L, Wound vac output 295, NGT output 250.  GI Imaging: none since start of TPN GI Surgeries / Procedures:  8/10 - further small bowel resection  Central access: Triple lumen CVC TPN start date: 07/28/2022   Nutritional Goals: Goal TPN rate is 85 mL/hr (provides 118 g of protein and 1912 kcals per day)  RD Assessment: Estimated Needs Total Energy Estimated Needs: 1900-2000 kcals Total Protein Estimated Needs: 115-130 g Total Fluid Estimated Needs: >/= 1.9 L  Current Nutrition:  NPO Propofol at ~30 mcg/kg/min (12.2 ml/hr or 322 kcal/day) - rate increased to 50 during surgery (unsure if it will  remain at this rate)  Plan:  Start TPN at 40 mL/hr at 1800 - no lipids for now as on Propofol; will Propofol will provide 50% of needs  Electrolytes in TPN: Na 47mq/L, K 528m/L, Ca 34m32mL, Mg 34mE12m, and Phos 134mm34m. Cl:Ac 1:1 Add standard MVI and trace elements to TPN Initiate Sensitive q6h SSI and adjust as needed  Maintenance IVF per MD.  Monitor TPN labs on Mon/Thurs, daily for next couple of days to assess tolerance Monitor Propofol and if stops, consider increasing lipids  JessiSloan LeiterrmD, BCPS, BCCCP Clinical Pharmacist Please refer to [facility] PhTurners Fallsers 07/31/2022,11:38 AM

## 2022-08-04 NOTE — Op Note (Signed)
07/28/2022  12:05 PM  PATIENT:  George Potter  88 y.o. male  PRE-OPERATIVE DIAGNOSIS:  SMALL BOWEL ISCHEMIA, open abdomen  POST-OPERATIVE DIAGNOSIS:  SMALL BOWEL ISCHEMIA  PROCEDURE:  Procedure(s): EXPLORATORY LAPAROTOMY (N/A) Small bowel resection Small bowel anastomosis, closure of abdominal wall  SURGEON:  Surgeon(s) and Role:    Ralene Ok, MD - Primary  ANESTHESIA:   general  EBL:  20 mL   BLOOD ADMINISTERED:none  DRAINS: none   LOCAL MEDICATIONS USED:  NONE  SPECIMEN:  Source of Specimen:'s ischemic small bowel with Meckel's diverticulum  DISPOSITION OF SPECIMEN:  PATHOLOGY  COUNTS:  YES  TOURNIQUET:  * No tourniquets in log *  DICTATION: .Dragon Dictation  Indication procedure: Patient is a 88 year old male who underwent SMA thrombus removal.  Patient subsequently had abdominal pain and underwent ex lap for ischemic bowel.  Patient did have a portion of ischemic bowel that was removed and left open.  Patient subsequently improved.  Patient was taken back for second look. Findings: Patient had 1 segment in the distal portion of the small bowel that was somewhat ischemic.  Patient has had a Meckel's diverticulum that was close to this area so this was removed en bloc.  The patient had approximately 105 cm of small bowel left in the ileocecal valve was left in place.  Details of procedure: After the patient was consented patient to take back to the OR and placed in supine position with tolerated is in place.  Patient underwent general anesthesia.  Patient was then prepped and draped in sterile fashion.  A timeout was called and all fax verified.  The wound VAC was removed.  The small bowel was eviscerated.  I ran the small bowel from the ligament of Treitz distally.  This area of the small bowel was pink and viable.  There is no further areas of ischemia.  The distal portion of the staple line approximately 18-0 cm from the staple line appeared to be  ischemic.  There was also a small Meckel's diverticulum that was just distal to this area.  Secondary to this I decided to take the small bowel distal to the Meckel's diverticulum.  This was sent out to pathology.  At this time corner staple line of each staple line was then removed.  41 GIA stapler was then used to create anastomosis.  A 3-0 silk was used and placed as an apex stitch.  The mesentery was then sent Theriot fashion using 3 oh silks.  At this time the abdominal contents were replaced in the abdominal cavity.  The omentum was brought up to the midline.  The fascia was reapproximated using #1 PDS single-stranded x 2 in a standard running fashion.  The skin was left open and packed with Kerlix.  The port sites on the right side were stapled shut.  Patient taught the procedure well was taken to the recovery in stable condition.   PLAN OF CARE: Admit to inpatient   PATIENT DISPOSITION:  ICU - intubated and critically ill.   Delay start of Pharmacological VTE agent (>24hrs) due to surgical blood loss or risk of bleeding: no

## 2022-08-05 ENCOUNTER — Encounter (HOSPITAL_COMMUNITY): Payer: Self-pay | Admitting: General Surgery

## 2022-08-05 DIAGNOSIS — K551 Chronic vascular disorders of intestine: Secondary | ICD-10-CM | POA: Diagnosis not present

## 2022-08-05 LAB — CBC
HCT: 24.8 % — ABNORMAL LOW (ref 39.0–52.0)
Hemoglobin: 8.8 g/dL — ABNORMAL LOW (ref 13.0–17.0)
MCH: 29.4 pg (ref 26.0–34.0)
MCHC: 35.5 g/dL (ref 30.0–36.0)
MCV: 82.9 fL (ref 80.0–100.0)
Platelets: 70 10*3/uL — ABNORMAL LOW (ref 150–400)
RBC: 2.99 MIL/uL — ABNORMAL LOW (ref 4.22–5.81)
RDW: 13.8 % (ref 11.5–15.5)
WBC: 18.6 10*3/uL — ABNORMAL HIGH (ref 4.0–10.5)
nRBC: 0.6 % — ABNORMAL HIGH (ref 0.0–0.2)

## 2022-08-05 LAB — COMPREHENSIVE METABOLIC PANEL
ALT: 29 U/L (ref 0–44)
ALT: 30 U/L (ref 0–44)
AST: 36 U/L (ref 15–41)
AST: 37 U/L (ref 15–41)
Albumin: 1.9 g/dL — ABNORMAL LOW (ref 3.5–5.0)
Albumin: 1.8 g/dL — ABNORMAL LOW (ref 3.5–5.0)
Alkaline Phosphatase: 75 U/L (ref 38–126)
Alkaline Phosphatase: 62 U/L (ref 38–126)
Anion gap: 8 (ref 5–15)
Anion gap: 6 (ref 5–15)
BUN: 30 mg/dL — ABNORMAL HIGH (ref 8–23)
BUN: 33 mg/dL — ABNORMAL HIGH (ref 8–23)
CO2: 21 mmol/L — ABNORMAL LOW (ref 22–32)
CO2: 22 mmol/L (ref 22–32)
Calcium: 6.9 mg/dL — ABNORMAL LOW (ref 8.9–10.3)
Calcium: 6.8 mg/dL — ABNORMAL LOW (ref 8.9–10.3)
Chloride: 107 mmol/L (ref 98–111)
Chloride: 108 mmol/L (ref 98–111)
Creatinine, Ser: 1.49 mg/dL — ABNORMAL HIGH (ref 0.61–1.24)
Creatinine, Ser: 1.37 mg/dL — ABNORMAL HIGH (ref 0.61–1.24)
GFR, Estimated: 43 mL/min — ABNORMAL LOW (ref >60)
GFR, Estimated: 48 mL/min — ABNORMAL LOW (ref >60)
Glucose, Bld: 156 mg/dL — ABNORMAL HIGH (ref 70–99)
Glucose, Bld: 143 mg/dL — ABNORMAL HIGH (ref 70–99)
Potassium: 3.6 mmol/L (ref 3.5–5.1)
Potassium: 3.9 mmol/L (ref 3.5–5.1)
Sodium: 136 mmol/L (ref 135–145)
Sodium: 136 mmol/L (ref 135–145)
Total Bilirubin: 1.7 mg/dL — ABNORMAL HIGH (ref 0.3–1.2)
Total Bilirubin: 1.5 mg/dL — ABNORMAL HIGH (ref 0.3–1.2)
Total Protein: 4.4 g/dL — ABNORMAL LOW (ref 6.5–8.1)
Total Protein: 4.1 g/dL — ABNORMAL LOW (ref 6.5–8.1)

## 2022-08-05 LAB — PHOSPHORUS
Phosphorus: 1.1 mg/dL — ABNORMAL LOW (ref 2.5–4.6)
Phosphorus: 2.1 mg/dL — ABNORMAL LOW (ref 2.5–4.6)
Phosphorus: 2 mg/dL — ABNORMAL LOW (ref 2.5–4.6)

## 2022-08-05 LAB — GLUCOSE, CAPILLARY
Glucose-Capillary: 146 mg/dL — ABNORMAL HIGH (ref 70–99)
Glucose-Capillary: 148 mg/dL — ABNORMAL HIGH (ref 70–99)
Glucose-Capillary: 160 mg/dL — ABNORMAL HIGH (ref 70–99)

## 2022-08-05 LAB — HEPARIN LEVEL (UNFRACTIONATED)
Heparin Unfractionated: 0.24 IU/mL — ABNORMAL LOW (ref 0.30–0.70)
Heparin Unfractionated: 0.34 IU/mL (ref 0.30–0.70)

## 2022-08-05 LAB — MAGNESIUM: Magnesium: 2.2 mg/dL (ref 1.7–2.4)

## 2022-08-05 LAB — SURGICAL PATHOLOGY

## 2022-08-05 MED ORDER — TRAVASOL 10 % IV SOLN
INTRAVENOUS | Status: DC
  Filled 2022-08-05: qty 1183.2

## 2022-08-05 MED ORDER — DEXTROSE 5 % IV SOLN
30.0000 mmol | Freq: Once | INTRAVENOUS | Status: DC
  Filled 2022-08-05: qty 10

## 2022-08-05 MED ORDER — POTASSIUM PHOSPHATES 15 MMOLE/5ML IV SOLN
30.0000 mmol | Freq: Once | INTRAVENOUS | Status: AC
  Administered 2022-08-05: 30 mmol via INTRAVENOUS
  Filled 2022-08-05: qty 10

## 2022-08-05 MED ORDER — POTASSIUM PHOSPHATES 15 MMOLE/5ML IV SOLN
20.0000 mmol | Freq: Once | INTRAVENOUS | Status: AC
Start: 2022-08-05 — End: 2022-08-06
  Administered 2022-08-05: 20 mmol via INTRAVENOUS
  Filled 2022-08-05: qty 6.67

## 2022-08-05 NOTE — Progress Notes (Signed)
[facility] Progress Note Patient Name: George Potter DOB: Dec 18, 1926 MRN: [id number]   Date of Service  08/05/2022  HPI/Events of Note  Hypokalemia  Hypophosphatemia - K+ = 3.3, PO4--- = 1.1 and Creatinine = 1.49.  eICU Interventions  Will replace K+ and PO4---.     Intervention Category Major Interventions: Electrolyte abnormality - evaluation and management  Nakeesha Bowler Eugene 08/05/2022, 5:52 AM

## 2022-08-05 NOTE — Progress Notes (Signed)
NAME:  George Potter, MRN:  [id number], DOB:  04/17/1926, LOS: 3 ADMISSION DATE:  08/22/2022, CONSULTATION DATE:  08/12/2022 REFERRING MD:  Laneta Simmers, MD, CHIEF COMPLAINT:  abd pain   History of Present Illness:  Patient is a 88 year old male with a hx chronic  mesenteric ischemia and afib (not on AC past 3 years due to rectal bleeding and bruising) and PAD with prior ischemic left leg (required iliofemoral thrombectomy by Dr. Trula Slade 10/14/2018) and HTN presented 8/7 to [facility] with complaints of abdominal pain onset 30 minutes after eating honey bun and coffee. Pain most severe RLQ. Found to have acute occlusion of SMA and was started on heparin and transferred to [facility] for vascular surgery evaluation. He underwent percutaneous thrombectomy of SMA via R femoral on 8/7 with initial symptom improvement. Pain acutely worsened overnight and became constant. Pt noted blood in his stool and inability to move his bowels.   Pertinent  Medical History   Past Medical History:  Diagnosis Date   Atrial fibrillation, chronic (HCC)    a. on Eliquis   Diverticulosis    HTN (hypertension)    Mild   Severe mitral regurgitation      Significant Hospital Events: Including procedures, antibiotic start and stop dates in addition to other pertinent events   8/7 SMA thrombectomy 8/8 ex-lap of necrotic bowel 8/10 back to OR today  Interim History / Subjective:   Patient remains critically ill on mechanical life support in the intensive care unit.  Core temp stable.    Objective   Blood pressure (!) 92/57, pulse 70, temperature (!) 97.2 F (36.2 C), temperature source Esophageal, resp. rate 18, height '5\' 10"'$  (1.778 m), weight 68 kg, SpO2 98 %.    Vent Mode: PRVC FiO2 (%):  [30 %] 30 % Set Rate:  [18 bmp] 18 bmp Vt Set:  [580 mL] 580 mL PEEP:  [5 cmH20] 5 cmH20 Pressure Support:  [8 cmH20] 8 cmH20 Plateau Pressure:  [15 cmH20-16 cmH20] 16 cmH20   Intake/Output Summary (Last 24 hours)  at 08/05/2022 1003 Last data filed at 08/05/2022 0900 Gross per 24 hour  Intake 2916.89 ml  Output 850 ml  Net 2066.89 ml   Filed Weights   08/14/2022 0955 08/19/2022 0834  Weight: 68 kg 68 kg    General: Elderly gentleman intubated on mechanical life support critically ill HEENT: Mucous membranes moist, endotracheal tube in place, NCAT Neuro: Sedated on mechanical support CV: Regular rate rhythm S1-S2 PULM: Bilateral mechanically ventilated breath sounds GI: Soft mildly distended, midline incision, dressing not taken down Extremities: Warm dry no significant edema, some dependent mild edema in the hands Skin: No rashes  Labs reviewed Serum creatinine 1.49 improving White blood cell count improving Hemoglobin stable 8.8.  Resolved Hospital Problem list     Assessment & Plan:   S/P SMA thrombectomy  for chronic mesenteric ischemia 8/7 S/P Ex lap for ischemic bowel 8/8 Ventilator Dependence, postop op ventilator dependence, expected Plan: Bowel regimen, drain management and postop incision managed by CCS/vascular surgery. Remains on full vent support at this time, attempt SAT SBT as tolerated. Midline incision to the abdomen at his age will likely increase his time needed on ventilator due to decreased FRC. Wean PEEP and FiO2 to maintain sats above 90%. Head of bed elevated at 30 degrees, VAP prophylaxis. Chest x-ray and ABG as needed. Mental status at this time precludes consideration for liberation from ventilator.   Postoperative shock AKI related to  fluid shifts, distributive shock, improved Lactic acidemia improved Hypo-Mg, Hypo-K- repleting Ventilator dependence- extubation depending on operative plans Afib on AC Plan: Continue heparin infusion Off of Neo-Synephrine at this time. Avoid nephrotoxins Continue to wean sedation to maintain RASS goal 0 to -1 if possible.   Best Practice (right click and "Reselect all SmartList Selections" daily)   Diet/type:  NPO DVT prophylaxis: heparin gtt GI prophylaxis: PPI Lines: Arterial Line Foley:  Yes, and it is still needed Code Status:  full code Last date of multidisciplinary goals of care discussion [per primary]   This patient is critically ill with multiple organ system failure; which, requires frequent high complexity decision making, assessment, support, evaluation, and titration of therapies. This was completed through the application of advanced monitoring technologies and extensive interpretation of multiple databases. During this encounter critical care time was devoted to patient care services described in this note for 34 minutes.  Garner Nash, DO [facility] 08/05/2022 10:03 AM

## 2022-08-05 NOTE — Anesthesia Postprocedure Evaluation (Signed)
Anesthesia Post Note  Patient: George Potter  Procedure(s) Performed: EXPLORATORY LAPAROTOMY (Abdomen) SMALL BOWEL RESECTION (Abdomen)     Patient location during evaluation: SICU Anesthesia Type: General Level of consciousness: sedated and patient remains intubated per anesthesia plan Pain management: pain level controlled Vital Signs Assessment: post-procedure vital signs reviewed and stable Respiratory status: patient on ventilator - see flowsheet for VS and patient remains intubated per anesthesia plan Cardiovascular status: stable Anesthetic complications: no   No notable events documented.  Last Vitals:  Vitals:   08/05/22 1400 08/05/22 1430  BP: 110/66 126/61  Pulse: 73 68  Resp: 18 18  Temp:    SpO2: 99% 99%    Last Pain:  Vitals:   08/05/22 0800  TempSrc: Esophageal  PainSc:                  Nolon Nations

## 2022-08-05 NOTE — Progress Notes (Signed)
[facility] Progress Note  1 Day Post-Op  Subjective: CC:  Intubated, sedated  Off of pressors.  Per RN, no BMs overnight  Objective: Vital signs in last 24 hours: Temp:  [96.5 F (35.8 C)-98.4 F (36.9 C)] 97.2 F (36.2 C) (08/11 0800) Pulse Rate:  [59-110] 70 (08/11 0830) Resp:  [12-26] 18 (08/11 0830) BP: (92-171)/(53-93) 92/57 (08/11 0830) SpO2:  [88 %-100 %] 98 % (08/11 0830) Arterial Line BP: (92-165)/(47-79) 137/61 (08/11 0900) FiO2 (%):  [30 %] 30 % (08/11 0743) Last BM Date : 08/05/2022  Intake/Output from previous day: 08/10 0701 - 08/11 0700 In: 2671.1 [I.V.:2047.2; IV Piggyback:623.9] Out: 840 [Urine:750; Emesis/NG output:70; Blood:20] Intake/Output this shift: Total I/O In: 245.8 [I.V.:79.4; IV Piggyback:166.5] Out: 100 [Urine:100]  PE: Gen:  critically ill appearing male Card:  RRR Pulm:  ventilated respirations Abd: Soft, midline wound c/d/I; minimal output from NG tube Skin: warm and dry, no rashes  Psych: unable to assess  Lab Results:  Recent Labs    08/25/2022 0344 08/05/22 0426  WBC 24.3* 18.6*  HGB 9.3* 8.8*  HCT 25.8* 24.8*  PLT 70* 70*   BMET Recent Labs    08/19/2022 1555 08/05/22 0426  NA 137 136  K 3.5 3.6  CL 104 107  CO2 23 21*  GLUCOSE 131* 156*  BUN 28* 30*  CREATININE 1.59* 1.49*  CALCIUM 6.9* 6.9*   PT/INR No results for input(s): "LABPROT", "INR" in the last 72 hours.  CMP     Component Value Date/Time   NA 136 08/05/2022 0426   K 3.6 08/05/2022 0426   CL 107 08/05/2022 0426   CO2 21 (L) 08/05/2022 0426   GLUCOSE 156 (H) 08/05/2022 0426   BUN 30 (H) 08/05/2022 0426   CREATININE 1.49 (H) 08/05/2022 0426   CALCIUM 6.9 (L) 08/05/2022 0426   PROT 4.4 (L) 08/05/2022 0426   ALBUMIN 1.9 (L) 08/05/2022 0426   AST 36 08/05/2022 0426   ALT 29 08/05/2022 0426   ALKPHOS 75 08/05/2022 0426   BILITOT 1.7 (H) 08/05/2022 0426   GFRNONAA 43 (L) 08/05/2022 0426   GFRAA 50 (L) 09/25/2019 0413   Lipase      Component Value Date/Time   LIPASE 39 08/06/2022 1003       Studies/Results: DG Chest Port 1 View  Result Date: 08/24/2022 CLINICAL DATA:  Endotracheal tube, preop EXAM: PORTABLE CHEST 1 VIEW COMPARISON:  Radiograph 08/03/2022 FINDINGS: Endotracheal tube tip overlies the midthoracic trachea approximately 5.8 cm above the carina. Left neck central venous catheter tip overlies the distal superior vena cava near the cavoatrial junction. Orogastric tube side port overlies the region of the GE junction, unchanged. Improved aeration in the left lung base with residual faint airspace opacities. No large pleural effusion. No pneumothorax. Bones are unchanged. IMPRESSION: Endotracheal tube overlies the midthoracic trachea approximately 5.8 cm above the carina. Unchanged orogastric tube position with side port near the GE junction. Consider advancement by 4.0 cm. Improving left basilar opacities with residual faint airspace disease, likely atelectasis. Electronically Signed   By: Maurine Simmering M.D.   On: 08/18/2022 08:13    Anti-infectives: Anti-infectives (From admission, onward)    Start     Dose/Rate Route Frequency Ordered Stop   08/20/2022 1230  cefoTEtan (CEFOTAN) 2 g in sodium chloride 0.9 % 100 mL IVPB        2 g 200 mL/hr over 30 Minutes Intravenous Every 12 hours 08/04/2022 1229     07/31/2022 1130  piperacillin-tazobactam (ZOSYN)  IVPB 3.375 g        3.375 g 100 mL/hr over 30 Minutes Intravenous  Once 08/06/2022 1118 07/28/2022 1152        Assessment/Plan Acute mesenteric ischemia s/p SMA thrombectomy 08/23/2022 Dr. Donzetta Matters  Small bowel ischemia  s/p exploratory laparotomy, SBR (left is discontinuity), placement abthera [facility] 8/8 Dr. Rosendo Gros S/p exploratory laparotomy, SBR, small bowel anastomosis, closure of abdominal wall 8/10 Dr. Rosendo Gros - afebrile, WBC 18.6 -  continue NG to LIWS for now, await bowel function, continue TPN - on hep gtt, thrombocytopenia ( plts 70 ), anticaogulation per  CCM/vascular sugery. No signs of bleeding right now but could consider holding w/ plts <100k - weaning form vent per CCM/primary team; per RN the patient did not wake up w/ propofol off.     LOS: 3 days   I reviewed nursing notes, Consultant CCM notes, hospitalist notes, last 24 h vitals and pain scores, last 48 h intake and output, last 24 h labs and trends, and last 24 h imaging results.    Obie Dredge, PA-C [facility] Please see Amion for pager number during day hours 7:00am-4:30pm

## 2022-08-05 NOTE — Progress Notes (Signed)
[facility] for heparin Indication:  SMA occlusion s/p thrombectomy Brief A/P: Heparin level subtherapeutic Increase Heparin rate  Labs: Recent Labs    08/24/2022 1735 08/03/22 0340 08/03/22 0746 08/03/22 1304 08/03/22 2010 08/11/2022 0344 07/28/2022 1555 08/05/22 0000  HGB 9.8* 9.8* 8.8*  --   --  9.3*  --   --   HCT 28.7* 28.4* 26.0*  --   --  25.8*  --   --   PLT 71* 67*  --   --   --  70*  --   --   HEPARINUNFRC  --  0.29*  --  0.24* 0.23*  --   --  0.24*  CREATININE 1.99* 1.96*  --   --   --  1.82* 1.59*  --     Assessment: 88 yo male s/p SMA thrombectomy 8/7, s/p ex lap 8/10, for heparin Goal of Therapy:  Heparin level 0.3-0.7 units/ml   Plan:  Increase Heparin 1250 units/hr Check heparin level in 8 hours.    Phillis Knack, PharmD, BCPS

## 2022-08-05 NOTE — Progress Notes (Signed)
Pharmacy Electrolyte Replacement  Recent Labs:  Recent Labs    08/05/22 0426 08/05/22 0954 08/05/22 1927  K 3.6  --  3.9  MG 2.2  --   --   PHOS 1.1*   < > 2.0*  CREATININE 1.49*  --  1.37*   < > = values in this interval not displayed.    Low Critical Values (K </= 2.5, Phos </= 1, Mg </= 1) Present: Phos = 2  MD Contacted: ICU/TPN protocol  Plan: Kphos 70mol x1  MOnnie Boer PharmD, BCIDP, AAHIVP, CPP Infectious Disease Pharmacist 08/05/2022 8:48 PM

## 2022-08-05 NOTE — Progress Notes (Signed)
Nutrition Follow-up  DOCUMENTATION CODES:   Non-severe (moderate) malnutrition in context of chronic illness  INTERVENTION:   Daily weights while acutely ill and on TPN; no weight since 8/08. Orders placed  Continue TPN: order per Pharmacy Recommend holding advancement of TPN to goal today given phosphorus <2.0 (1.1) post TPN initiation. Discussed with pharmacy and PCCM. Plan to recheck today post phosphate infusion and provide further supplement if remains low. Continue close monitoring of electrolytes   NUTRITION DIAGNOSIS:   Moderate Malnutrition related to chronic illness as evidenced by moderate fat depletion, moderate muscle depletion, severe muscle depletion.  Being addressed via TPN  GOAL:   Patient will meet greater than or equal to 90% of their needs  Progressing  MONITOR:   Vent status, Labs, Weight trends, I & O's  REASON FOR ASSESSMENT:   Ventilator    ASSESSMENT:   88 yo male admitted with abdominal pain and found to have SMA occlusion and underwent perc thrombectomy; developed worsening abd pain with increasing lactate and taken to OR on 8/8 fpr ex lap and required SB resection for ischemia. PMH includes chronic mesenteric ischemia, afib, HTN, diverticulosis  8/07 Acute mesenteric ischemia with distal SMA thrombosis requiring perc thrombectomy 8/08 Ex lap, SB resection (bowel left in discontinuity), placement of abthera VAC 8/10 Return to OR, Ex Lap, SB resection, Anastomosis, Closure of Abdominal wall, approx 105 cm SB left, ileocecal valve left in place  Pt remains on vent support, remains on neo synpehrine Propofol: 7.7 ml/hr (203 kcals)  No BM post op, no flatus, no BS, NG to LIS  TPN initiated yesterday  Phosphorus < 2.0 post initiation of TPN, current level 1.1. TPN guidelines recommend not advancing TPN rate if phosphorus <2.0. Recommend continuing TPN at current rate until supplement, phosphorus rechecked and supplemented until phos >2.0. Pt is  at high risk for refeeding.   Net + 9 L. UOP only 750 mL in 24 hours, 325  mL thus far today No weight since 68 kg. Recommend daily weights for now; orders placed. Noted moderate edema on exam  Labs: phosphorus 1.1 (L), potassium 3.6 (wdl), magnesium 2.2 (wdl), Creatinine 1.49, BUN 30, corrected calcium  Meds: ss novolog   Diet Order:   Diet Order             Diet NPO time specified  Diet effective now                   EDUCATION NEEDS:   Not appropriate for education at this time  Skin:  Skin Assessment: Skin Integrity Issues: Skin Integrity Issues:: Stage II, Wound VAC Stage II: coccyx Wound Vac: open abdomen post resection  Last BM:  8/7  Height:   Ht Readings from Last 1 Encounters:  08/22/2022 '5\' 10"'$  (1.778 m)    Weight:   Wt Readings from Last 1 Encounters:  08/16/2022 68 kg     BMI:  Body mass index is 21.51 kg/m.  Estimated Nutritional Needs:   Kcal:  1900-2000 kcals  Protein:  115-130 g  Fluid:  >/= 1.9 L   Kerman Passey MS, RDN, LDN, CNSC Registered Dietitian 3 Clinical Nutrition RD Pager and On-Call Pager Number Located in [place]

## 2022-08-05 NOTE — Progress Notes (Signed)
ANTICOAGULATION CONSULT NOTE   Pharmacy Consult for IV heparin Indication:  SMA   occlusion s/p thrombectomy  No Known Allergies  Patient Measurements: Height: '5\' 10"'$  (177.8 cm) Weight: 68 kg (149 lb 14.6 oz) IBW/kg (Calculated) : 73 Heparin Dosing Weight: 68 kg  Vital Signs: Temp: 97.7 F (36.5 C) (08/11 1230) Temp Source: Esophageal (08/11 0800) BP: 129/72 (08/11 1530) Pulse Rate: 71 (08/11 1530)  Labs: Recent Labs    08/03/22 0340 08/03/22 0746 08/03/22 1304 08/03/22 2010 08/17/2022 0344 08/18/2022 1555 08/05/22 0000 08/05/22 0426 08/05/22 0912  HGB 9.8* 8.8*  --   --  9.3*  --   --  8.8*  --   HCT 28.4* 26.0*  --   --  25.8*  --   --  24.8*  --   PLT 67*  --   --   --  70*  --   --  70*  --   HEPARINUNFRC 0.29*  --    < > 0.23*  --   --  0.24*  --  0.34  CREATININE 1.96*  --   --   --  1.82* 1.59*  --  1.49*  --    < > = values in this interval not displayed.     Estimated Creatinine Clearance: 29.2 mL/min (A) (by C-G formula based on SCr of 1.49 mg/dL (H)).   Assessment: Pharmacy is consulted to dose heparin in 88 yo male diagnosed with SMA occlusion.  Per med rec, Pt was on Eliquis previously for afib but had stopped taking the med about 3 years ago due to rectal bleeding and bruising (baseline aPTT 34 sec).  PMH includes Ischemic left leg that required Iliofemoral thromboembolectomy in 2019.    S/p mechanical thrombectomy 8/7, now s/p exploratory laparotomy with small bowel resection and wound VAC placement by surgery on 8/8. Underwent 2nd look exploratory laparotomy 8/10 - okay per surgery to restart heparin 4 hours after procedure. Hgb 9.3, plt 70. No s/sx of bleeding or infusion issues.   Heparin drip 1250 uts/hr heparin level 0.34 at goal, hgb  stable pltc low 70 stable  no bleeding noted   Goal of Therapy:  Heparin level 0.3-0.7 units/ml Monitor platelets by anticoagulation protocol: Yes   Plan:  Continue heparin drip 1250 uts/hr  Daily cbc and heparin  level Monitor s/s bleeding  F/u long-term AC plans   Bonnita Nasuti Pharm.D. CPP, BCPS Clinical Pharmacist [phone] 08/05/2022 3:45 PM   Please check AMION for all [place] phone numbers After 10:00 PM, call [place] [phone]

## 2022-08-05 NOTE — Progress Notes (Addendum)
PHARMACY - TOTAL PARENTERAL NUTRITION CONSULT NOTE   Indication: acute mesenteric ischemia s/p small bowel resection  Patient Measurements: Height: '5\' 10"'$  (177.8 cm) Weight: 68 kg (149 lb 14.6 oz) IBW/kg (Calculated) : 73 TPN AdjBW (KG): 68 Body mass index is 21.51 kg/m.  Assessment: 88 year old man with a history of chronic mesenteric ischemia, atrial fibrillation, hypertension, and PAD with prior ischemic left leg status post thrombectomy in 2019 who presented to [facility] with severe abdominal pain found to have acute SMA occlusion, underwent percutaneous thrombectomy on 8/7 then developed post-operative pain and underwent ex-lap for acute mesenteric ischemia and had small bowel resection and bowel was left in discontinuity with wound vac in place. Patient is back in the OR 8/10 for re-exploration with more small bowel removed. Last meal was 8/7 AM. Patient received D5NS 8/8- 8/9. Pharmacy consulted to start TPN.   Glucose / Insulin: CBGs <180 s/p dexamethasone 8/10. No history of diabetes. Used 5 units SSI/ 24hr.  Electrolytes: K 3.6 (s/p 90 mEq IV). CO2 21 S/p sodium bicarb infusion.  Phos 1.1 (receiving 30 mmol), CoCa 8.6, others wnl Renal: Scr 1.49 down (BL ~ 1.3-1.4). BUN 30 up Hepatic: LFTs wnl. Tbili 1.7 down. TG 104 (Propofol being weaned as able)  Intake / Output; MIVF: UOP 0.5 ml/kg/hr, Net +8.1L, EBL 20 ml; NGT output 34m  GI Imaging: none since start of TPN GI Surgeries / Procedures:  8/7 SMA thrombectomy 8/8 ex lap small bowel resection, left in discontinuity  8/10 ex lap, further SB resection & anastomosis, abd wall closure   Central access: Triple lumen CVC TPN start date: 08/13/2022   Nutritional Goals: Goal TPN rate is 85 mL/hr (provides 118 g of protein and 1912 kcals per day)  RD Assessment: Estimated Needs Total Energy Estimated Needs: 1900-2000 kcals Total Protein Estimated Needs: 115-130 g Total Fluid Estimated Needs: >/= 1.9 L  Current  Nutrition:  NPO + TPN Propofol (10 % lipid provides 1.1kcal/ml) down to ~15-20 mcg/kg/min (6.12-8 ml/hr or ~200 kcal/day)   Plan:  Advance TPN to goal 85 mL/hr at 1800 with low amount of lipids given still on propofol (Provides 118 g protein, 20 g lipids, 255 g dextrose, 1544 Kcal, meeting ~92% estimated needs including propofol) Electrolytes in TPN: Increase Na 100 mEq/L, Mg 4 mEq/L, Phos 15 mmol/L, Cl:Ac 1:2;  Continue K 25 mEq/L, Ca 3 mEq/L  Add standard MVI and trace elements to TPN Initiate Sensitive q6h SSI and adjust as needed  KPhos 30 mmol x1 ordered by team. Recheck phos at 1700 and replete as needed Monitor TPN labs daily until stable at goal then on Mon/Thurs Monitor Propofol rate, increase lipids as able  LBenetta Spar PharmD, BCPS, BCCP Clinical Pharmacist  Please check AMION for all [place] numbers After 10:00 PM, call [place]

## 2022-08-05 NOTE — Progress Notes (Signed)
[place] Progress Note Patient Name: George Potter DOB: 1926-07-08 MRN: [id number]   Date of Service  08/05/2022  HPI/Events of Note  Patient needs a palliative care consult.  eICU Interventions  Palliative care consult ordered.        [name] 08/05/2022, 9:50 PM

## 2022-08-05 NOTE — Progress Notes (Addendum)
[facility]  Subjective  - intubated and sedated   Objective 98/67 64 98.4 F (36.9 C) (Oral) 18 98%  Intake/Output Summary (Last 24 hours) at 08/05/2022 0742 Last data filed at 08/05/2022 0500 Gross per 24 hour  Intake 2529.92 ml  Output 840 ml  Net 1689.92 ml    B LE warm to touch and well perfused without ischemic changes Abdominal dressing in place  Heart irregularly irregular   Assessment/Planning: POD # 3 88 y.o. Potter is s/p SMA thrombectomy 3 Day Post and Ex Lap with Small bowel resection for ischemic bowel   Intubated and sedated Cr trending down now 1.49, urine OP 750 Leukocytosis 18.6 improved Cont.Heaprin  Neo support 20 mcg/min  TPN  Hypokalemia corrected 3.6  Roxy Horseman 08/05/2022 7:George AM --  Laboratory Lab Results: Recent Labs    08/10/2022 0344 08/05/22 0426  WBC 24.3* 18.6*  HGB 9.3* 8.8*  HCT 25.8* 24.8*  PLT 70* 70*   BMET Recent Labs    08/06/2022 1555 08/05/22 0426  NA 137 136  K 3.5 3.6  CL 104 107  CO2 23 21*  GLUCOSE 131* 156*  BUN 28* 30*  CREATININE 1.59* 1.49*  CALCIUM 6.9* 6.9*    COAG Lab Results  Component Value Date   INR 1.2 08/15/2022   INR 1.0 07/22/2008   No results found for: "PTT"   I have independently interviewed and examined patient and agree with PA assessment and plan above.weaning to extubate.   Sheppard Luckenbach C. Donzetta Matters, MD [facility] Office: [phone] Pager: [phone]

## 2022-08-06 DIAGNOSIS — R579 Shock, unspecified: Secondary | ICD-10-CM | POA: Diagnosis not present

## 2022-08-06 DIAGNOSIS — E440 Moderate protein-calorie malnutrition: Secondary | ICD-10-CM

## 2022-08-06 DIAGNOSIS — Z7189 Other specified counseling: Secondary | ICD-10-CM

## 2022-08-06 DIAGNOSIS — J9601 Acute respiratory failure with hypoxia: Secondary | ICD-10-CM | POA: Diagnosis not present

## 2022-08-06 DIAGNOSIS — K55059 Acute (reversible) ischemia of intestine, part and extent unspecified: Secondary | ICD-10-CM | POA: Diagnosis not present

## 2022-08-06 DIAGNOSIS — K551 Chronic vascular disorders of intestine: Secondary | ICD-10-CM | POA: Diagnosis not present

## 2022-08-06 DIAGNOSIS — Z515 Encounter for palliative care: Secondary | ICD-10-CM

## 2022-08-06 LAB — COMPREHENSIVE METABOLIC PANEL
ALT: 28 U/L (ref 0–44)
AST: 40 U/L (ref 15–41)
Albumin: 1.5 g/dL — ABNORMAL LOW (ref 3.5–5.0)
Alkaline Phosphatase: 63 U/L (ref 38–126)
Anion gap: 8 (ref 5–15)
BUN: 34 mg/dL — ABNORMAL HIGH (ref 8–23)
CO2: 22 mmol/L (ref 22–32)
Calcium: 6.7 mg/dL — ABNORMAL LOW (ref 8.9–10.3)
Chloride: 107 mmol/L (ref 98–111)
Creatinine, Ser: 1.28 mg/dL — ABNORMAL HIGH (ref 0.61–1.24)
GFR, Estimated: 52 mL/min — ABNORMAL LOW (ref >60)
Glucose, Bld: 139 mg/dL — ABNORMAL HIGH (ref 70–99)
Potassium: 4 mmol/L (ref 3.5–5.1)
Sodium: 137 mmol/L (ref 135–145)
Total Bilirubin: 1.2 mg/dL (ref 0.3–1.2)
Total Protein: 3.8 g/dL — ABNORMAL LOW (ref 6.5–8.1)

## 2022-08-06 LAB — CBC
HCT: 20.9 % — ABNORMAL LOW (ref 39.0–52.0)
HCT: 24.2 % — ABNORMAL LOW (ref 39.0–52.0)
Hemoglobin: 7.3 g/dL — ABNORMAL LOW (ref 13.0–17.0)
Hemoglobin: 8.2 g/dL — ABNORMAL LOW (ref 13.0–17.0)
MCH: 29 pg (ref 26.0–34.0)
MCH: 29.4 pg (ref 26.0–34.0)
MCHC: 34.9 g/dL (ref 30.0–36.0)
MCHC: 33.9 g/dL (ref 30.0–36.0)
MCV: 82.9 fL (ref 80.0–100.0)
MCV: 86.7 fL (ref 80.0–100.0)
Platelets: 56 10*3/uL — ABNORMAL LOW (ref 150–400)
Platelets: 64 10*3/uL — ABNORMAL LOW (ref 150–400)
RBC: 2.52 MIL/uL — ABNORMAL LOW (ref 4.22–5.81)
RBC: 2.79 MIL/uL — ABNORMAL LOW (ref 4.22–5.81)
RDW: 13.9 % (ref 11.5–15.5)
RDW: 14 % (ref 11.5–15.5)
WBC: 18.1 10*3/uL — ABNORMAL HIGH (ref 4.0–10.5)
WBC: 22.9 10*3/uL — ABNORMAL HIGH (ref 4.0–10.5)
nRBC: 0.8 % — ABNORMAL HIGH (ref 0.0–0.2)
nRBC: 0.7 % — ABNORMAL HIGH (ref 0.0–0.2)

## 2022-08-06 LAB — MAGNESIUM: Magnesium: 2.1 mg/dL (ref 1.7–2.4)

## 2022-08-06 LAB — GLUCOSE, CAPILLARY
Glucose-Capillary: 108 mg/dL — ABNORMAL HIGH (ref 70–99)
Glucose-Capillary: 120 mg/dL — ABNORMAL HIGH (ref 70–99)
Glucose-Capillary: 157 mg/dL — ABNORMAL HIGH (ref 70–99)

## 2022-08-06 LAB — HEPARIN LEVEL (UNFRACTIONATED): Heparin Unfractionated: 0.52 IU/mL (ref 0.30–0.70)

## 2022-08-06 LAB — PHOSPHORUS: Phosphorus: 2.8 mg/dL (ref 2.5–4.6)

## 2022-08-06 LAB — TRIGLYCERIDES: Triglycerides: 90 mg/dL (ref <150)

## 2022-08-06 MED ORDER — HALOPERIDOL LACTATE 5 MG/ML IJ SOLN: 0.5000 mg | INTRAMUSCULAR | Status: DC | PRN

## 2022-08-06 MED ORDER — POLYVINYL ALCOHOL 1.4 % OP SOLN
1.0000 [drp] | Freq: Four times a day (QID) | OPHTHALMIC | Status: DC | PRN
Start: 2022-08-06 — End: 2022-08-07

## 2022-08-06 MED ORDER — DEXTROSE IN LACTATED RINGERS 5 % IV SOLN: INTRAVENOUS | Status: DC | PRN

## 2022-08-06 MED ORDER — MORPHINE 100MG IN NS 100ML (1MG/ML) PREMIX INFUSION
2.0000 mg/h | INTRAVENOUS | Status: DC
  Administered 2022-08-06: 9 mg/h via INTRAVENOUS
  Administered 2022-08-06: 2 mg/h via INTRAVENOUS
  Administered 2022-08-07: 10 mg/h via INTRAVENOUS
  Filled 2022-08-06 (×3): qty 100

## 2022-08-06 MED ORDER — LORAZEPAM 2 MG/ML IJ SOLN
1.0000 mg | INTRAMUSCULAR | Status: DC | PRN
  Filled 2022-08-06: qty 1

## 2022-08-06 MED ORDER — BIOTENE DRY MOUTH MT LIQD: 15.0000 mL | OROMUCOSAL | Status: DC | PRN

## 2022-08-06 MED ORDER — DEXMEDETOMIDINE HCL IN NACL 400 MCG/100ML IV SOLN
0.4000 ug/kg/h | INTRAVENOUS | Status: DC
  Administered 2022-08-06: 0.4 ug/kg/h via INTRAVENOUS
  Filled 2022-08-06: qty 100

## 2022-08-06 MED ORDER — GLYCOPYRROLATE 0.2 MG/ML IJ SOLN: 0.2000 mg | INTRAMUSCULAR | Status: DC | PRN

## 2022-08-06 MED ORDER — TRAVASOL 10 % IV SOLN
INTRAVENOUS | Status: DC
  Filled 2022-08-06: qty 1183.2

## 2022-08-06 MED ORDER — MORPHINE BOLUS VIA INFUSION
1.0000 mg | INTRAVENOUS | Status: DC | PRN
  Administered 2022-08-06 (×3): 3 mg via INTRAVENOUS
  Administered 2022-08-06: 2 mg via INTRAVENOUS

## 2022-08-06 MED ORDER — ACETAMINOPHEN 650 MG RE SUPP: 650.0000 mg | Freq: Four times a day (QID) | RECTAL | Status: DC | PRN

## 2022-08-06 MED ORDER — POTASSIUM PHOSPHATES 15 MMOLE/5ML IV SOLN
10.0000 mmol | Freq: Once | INTRAVENOUS | Status: AC
  Administered 2022-08-06: 10 mmol via INTRAVENOUS
  Filled 2022-08-06: qty 3.33

## 2022-08-06 MED ORDER — CALCIUM GLUCONATE-NACL 1-0.675 GM/50ML-% IV SOLN
1.0000 g | Freq: Once | INTRAVENOUS | Status: AC
  Administered 2022-08-06: 1000 mg via INTRAVENOUS
  Filled 2022-08-06: qty 50

## 2022-08-06 NOTE — Progress Notes (Addendum)
NAME:  George Potter, MRN:  [id number], DOB:  1926/01/04, LOS: 4 ADMISSION DATE:  08/03/2022, CONSULTATION DATE:  08/12/2022 REFERRING MD:  Laneta Simmers, MD, CHIEF COMPLAINT:  abd pain   History of Present Illness:  Patient is a 88 year old male with a hx chronic  mesenteric ischemia and afib (not on AC past 3 years due to rectal bleeding and bruising) and PAD with prior ischemic left leg (required iliofemoral thrombectomy by Dr. Trula Slade 10/14/2018) and HTN presented 8/7 to [facility] with complaints of abdominal pain onset 30 minutes after eating honey bun and coffee. Pain most severe RLQ. Found to have acute occlusion of SMA and was started on heparin and transferred to [facility] for vascular surgery evaluation. He underwent percutaneous thrombectomy of SMA via R femoral on 8/7 with initial symptom improvement. Pain acutely worsened overnight and became constant. Pt noted blood in his stool and inability to move his bowels.   Pertinent  Medical History   Past Medical History:  Diagnosis Date   Atrial fibrillation, chronic (HCC)    a. on Eliquis   Diverticulosis    HTN (hypertension)    Mild   Severe mitral regurgitation     Significant Hospital Events: Including procedures, antibiotic start and stop dates in addition to other pertinent events   8/7 SMA thrombectomy 8/8 ex-lap of necrotic bowel 8/10 back to OR today  Interim History / Subjective:   Remains critically ill on mechanical support  Objective   Blood pressure (!) 145/77, pulse 75, temperature 97.7 F (36.5 C), resp. rate 16, height '5\' 10"'$  (1.778 m), weight 74.5 kg, SpO2 100 %.    Vent Mode: PRVC FiO2 (%):  [30 %] 30 % Set Rate:  [18 bmp] 18 bmp Vt Set:  [580 mL] 580 mL PEEP:  [5 cmH20] 5 cmH20 Plateau Pressure:  [14 cmH20-18 cmH20] 18 cmH20   Intake/Output Summary (Last 24 hours) at 08/06/2022 2878 Last data filed at 08/06/2022 0700 Gross per 24 hour  Intake 3200.87 ml  Output 1000 ml  Net 2200.87 ml    Filed Weights   08/05/2022 0955 08/08/2022 0834 08/06/22 0500  Weight: 68 kg 68 kg 74.5 kg    General: Elderly gentleman, intubated on mechanical life support, critically ill HEENT: Mucous membranes moist, endotracheal tube in place, NCAT opens eyes to stimulation Neuro: Sedated on mechanical support will open eyes to stimulation and does respond to pain CV: Regular rate rhythm, S1-S2 PULM: Bilateral mechanically ventilated breath sounds GI: Soft, nontender, nondistended Extremities: Warm no significant edema Skin: No rash  Labs reviewed  Resolved Hospital Problem list     Assessment & Plan:   S/P SMA thrombectomy  for chronic mesenteric ischemia 8/7 S/P Ex lap for ischemic bowel 8/8 Ventilator Dependence, postop op ventilator dependence, expected Plan: Bowel regimen, drain management and postop incision management by CCS and vascular surgery. Remains on full vent support continue and attempt SAT SBT as tolerated. We will decrease propofol. Midline incision on abdomen at his age will definitely decrease his FRC make a little bit more difficult to come off the ventilator but hopefully we can get him off soon. Continue to wean PEEP and FiO2 to maintain sats above 90%. Head of bed elevated and VAP prophylaxis. Chest x-ray and ABG as needed. Mental status right now precludes liberation from the ventilator but hopefully this will clear up shortly.  Postoperative shock AKI related to fluid shifts, distributive shock, improved Lactic acidemia improved Hypo-Mg, Hypo-K- repleting Ventilator dependence-  extubation depending on operative plans Afib on [facility] Plan: Continue heparin infusion Off pressors Avoid nephrotoxins Goal RASS 0 to -1.  Thrombocytopenia. Does not have a acute drop. I think unlikely to be HIT. Plan: Does need to be anticoagulated. We will start argatroban.  Will discuss with pharmacy We need to potentially swap someone else.   Best Practice (right click and  "Reselect all SmartList Selections" daily)   Diet/type: NPO DVT prophylaxis: heparin gtt GI prophylaxis: PPI Lines: Arterial Line Foley:  Yes, and it is still needed Code Status:  full code Last date of multidisciplinary goals of care discussion [per primary]  This patient is critically ill with multiple organ system failure; which, requires frequent high complexity decision making, assessment, support, evaluation, and titration of therapies. This was completed through the application of advanced monitoring technologies and extensive interpretation of multiple databases. During this encounter critical care time was devoted to patient care services described in this note for 33 minutes.  Garner Nash, DO  Pulmonary Critical Care 08/06/2022 8:11 AM

## 2022-08-06 NOTE — Progress Notes (Signed)
[place] for IV heparin Indication:  SMA   occlusion s/p thrombectomy  No Known Allergies  Patient Measurements: Height: '5\' 10"'$  (177.8 cm) Weight: 74.5 kg (164 lb 3.9 oz) IBW/kg (Calculated) : 73 Heparin Dosing Weight: 68 kg  Vital Signs: BP: 145/77 (08/12 0600) Pulse Rate: 75 (08/12 0600)  Labs: Recent Labs    07/26/2022 0344 07/28/2022 1555 08/05/22 0000 08/05/22 0426 08/05/22 0912 08/05/22 1927 08/06/22 0424  HGB 9.3*  --   --  8.8*  --   --  7.3*  HCT 25.8*  --   --  24.8*  --   --  20.9*  PLT 70*  --   --  70*  --   --  56*  HEPARINUNFRC  --   --  0.24*  --  0.34  --  0.52  CREATININE 1.82*   < >  --  1.49*  --  1.37* 1.28*   < > = values in this interval not displayed.     Estimated Creatinine Clearance: 36.4 mL/min (A) (by C-G formula based on SCr of 1.28 mg/dL (H)).   Assessment: Pharmacy is consulted to dose heparin in 88 yo male diagnosed with SMA occlusion.  Per med rec, Pt was on Eliquis previously for afib but had stopped taking the med about 3 years ago due to rectal bleeding and bruising (baseline aPTT 34 sec).  PMH includes Ischemic left leg that required Iliofemoral thromboembolectomy in 2019.    S/p mechanical thrombectomy 8/7, now s/p exploratory laparotomy with small bowel resection and wound VAC placement by surgery on 8/8. Underwent 2nd look exploratory laparotomy 8/10.   Heparin level came back therapeutic at 0.52, on heparin 1250 units/hr. Hgb down to 7.3, plt also drifting down to 56. Repeat CBC with plt at 65. 4Ts score was 2-3 (low probability for HIT). No s/sx of bleeding or infusion issues.   Goal of Therapy:  Heparin level 0.3-0.7 units/ml Monitor platelets by anticoagulation protocol: Yes   Plan:  Continue heparin drip 1250 uts/hr  Daily CBC and heparin level Monitor s/s bleeding  F/u long-term AC plans  Antonietta Jewel, PharmD, [place] Pharmacist  Phone: [phone] 08/06/2022 7:16  AM  Please check AMION for all [place] phone numbers After 10:00 PM, call [place] [phone]

## 2022-08-06 NOTE — Progress Notes (Signed)
2 Days Post-Op   Subjective/Chief Complaint: No acute changes PLt con't to decline   Objective: Vital signs in last 24 hours: Temp:  [97.5 F (36.4 C)-97.7 F (36.5 C)] 97.7 F (36.5 C) (08/11 1800) Pulse Rate:  [59-85] 75 (08/12 0600) Resp:  [16-21] 16 (08/12 0600) BP: (85-146)/(47-93) 145/77 (08/12 0600) SpO2:  [97 %-100 %] 100 % (08/12 0600) Arterial Line BP: (96-155)/(41-71) 155/71 (08/12 0600) FiO2 (%):  [30 %] 30 % (08/12 0311) Weight:  [74.5 kg] 74.5 kg (08/12 0500) Last BM Date : 08/17/2022  Intake/Output from previous day: 08/11 0701 - 08/12 0700 In: 3345.8 [I.V.:2063.8; NG/GT:30; IV Piggyback:1252.1] Out: 1100 [Urine:975; Emesis/NG output:125] Intake/Output this shift: No intake/output data recorded.  PE:  Constitutional: No acute distress, conversant, appears states age. Eyes: Anicteric sclerae, moist conjunctiva, no lid lag Lungs: Clear to auscultation bilaterally, normal respiratory effort CV: regular rate and rhythm, no murmurs, no peripheral edema, pedal pulses 2+ GI: Soft, no masses or hepatosplenomegaly, non-tender to palpation, midline c/d/I, hypoactive BS Skin: No rashes, palpation reveals normal turgor Psychiatric: appropriate judgment and insight, oriented to person, place, and time   Lab Results:  Recent Labs    08/05/22 0426 08/06/22 0424  WBC 18.6* 18.1*  HGB 8.8* 7.3*  HCT 24.8* 20.9*  PLT 70* 56*   BMET Recent Labs    08/05/22 1927 08/06/22 0424  NA 136 137  K 3.9 4.0  CL 108 107  CO2 22 22  GLUCOSE 143* 139*  BUN 33* 34*  CREATININE 1.37* 1.28*  CALCIUM 6.8* 6.7*   PT/INR No results for input(s): "LABPROT", "INR" in the last 72 hours. ABG No results for input(s): "PHART", "HCO3" in the last 72 hours.  Invalid input(s): "PCO2", "PO2"  Studies/Results: No results found.  Anti-infectives: Anti-infectives (From admission, onward)    Start     Dose/Rate Route Frequency Ordered Stop   08/13/2022 1230  cefoTEtan (CEFOTAN) 2  g in sodium chloride 0.9 % 100 mL IVPB        2 g 200 mL/hr over 30 Minutes Intravenous Every 12 hours 08/05/2022 1229     08/22/2022 1130  piperacillin-tazobactam (ZOSYN) IVPB 3.375 g        3.375 g 100 mL/hr over 30 Minutes Intravenous  Once 08/24/2022 1118 08/05/2022 1152       Assessment/Plan: Acute mesenteric ischemia s/p SMA thrombectomy 07/29/2022 Dr. Donzetta Matters   Small bowel ischemia  s/p exploratory laparotomy, SBR (left is discontinuity), placement abthera [facility] 8/8 Dr. Rosendo Gros S/p exploratory laparotomy, SBR, small bowel anastomosis, closure of abdominal wall 8/10 Dr. Rosendo Gros - afebrile, WBC 18.6 - Plt = 56 (70), ?HITT.  Will d/w CCM -  continue NG to LIWS for now, await bowel function, continue TPN - hoping to extubate per CCM    LOS: 3 days    I reviewed nursing notes, Consultant CCM notes, hospitalist notes, last 24 h vitals and pain scores, last 48 h intake and output, last 24 h labs and trends, and last 24 h imaging results.  LOS: 4 days    Ralene Ok 08/06/2022

## 2022-08-06 NOTE — Procedures (Signed)
Extubation Procedure Note  Patient Details:   Name: George Potter DOB: 1926/12/06 MRN: [id number]   Airway Documentation:    Vent end date: 08/06/22 Vent end time: 1030   Evaluation  O2 sats: stable throughout Complications: No apparent complications Patient did tolerate procedure well. Bilateral Breath Sounds: Clear, Diminished   Yes  Patient had positive cuff leak prior. Extubated to 2 LPM nasal cannula. Strong cough, no stridor noted. Patient able to speak. BBS clear;diminished. Vitals stable.   [name] M 08/06/2022, 10:46 AM

## 2022-08-06 NOTE — Progress Notes (Signed)
[facility] [place] [facility]  Subjective  - intubated and sedated   Objective 123/69 98 98.4 F (36.9 C) (Esophageal) 20 100%  Intake/Output Summary (Last 24 hours) at 08/06/2022 7262 Last data filed at 08/06/2022 0900 Gross per 24 hour  Intake 3339.05 ml  Output 1142 ml  Net 2197.05 ml     B LE warm to touch and well perfused without ischemic changes Abdominal dressing in place  Heart irregularly irregular   Assessment/Planning: POD # 3 88 y.o. male is s/p SMA thrombectomy 3 Day Post and Ex Lap with Small bowel resection for ischemic bowel   Intubated and sedated Cr trending down  Leukocytosis improved Cont.Heaprin  TPN  Appreciate critical care involvement, general surgeon involvement.  George Potter 08/06/2022 9:52 AM --  Laboratory Lab Results: Recent Labs    08/05/22 0426 08/06/22 0424  WBC 18.6* 18.1*  HGB 8.8* 7.3*  HCT 24.8* 20.9*  PLT 70* 56*    BMET Recent Labs    08/05/22 1927 08/06/22 0424  NA 136 137  K 3.9 4.0  CL 108 107  CO2 22 22  GLUCOSE 143* 139*  BUN 33* 34*  CREATININE 1.37* 1.28*  CALCIUM 6.8* 6.7*     COAG Lab Results  Component Value Date   INR 1.2 08/14/2022   INR 1.0 07/22/2008   No results found for: "PTT"   I have independently interviewed and examined patient and agree with PA assessment and plan above.weaning to extubate.   Brandon C. Donzetta Matters, MD [facility] [place] Office: [phone] Pager: [phone]

## 2022-08-06 NOTE — Consult Note (Signed)
Palliative Care Consult Note                                  Date: 08/06/2022   Patient Name: George Potter  DOB: 1926/03/03  MRN: [id number]  Age / Sex: 88 y.o., male  PCP: George Infante, MD Referring Physician: Thomes Potter*  Reason for Consultation: Establishing goals of care  HPI/Patient Profile: 88 y.o. male  with past medical history of chronic mesenteric ischemia and A-fib (not on AC past 3 years due to rectal bleeding and bruising), PAD with prior ischemic left leg (required iliofemoral thrombectomy 09/2018), and HTN.  He presented to [facility] ED on 08/09/2022 with complaints of abdominal pain, most severely in the RLQ.  Found to have acute occlusion of SMA and was started on heparin and transferred to [facility] for vascular surgery evaluation.  He underwent percutaneous thrombectomy of SMA via right femoral on 8/7 with initial symptom improvement.  Patient acutely worsened overnight with blood noted in his stool and inability to move his bowels.  He was admitted to [facility]. Palliative Medicine was consulted for goals of care    Past Medical History:  Diagnosis Date   Atrial fibrillation, chronic ([place])    a. on Eliquis   Diverticulosis    HTN (hypertension)    Mild   Severe mitral regurgitation     Subjective:   I have reviewed medical records including EPIC notes, labs and imaging, and assessed the patient at bedside. He was extubated earlier this morning and seems to have more respiratory status off the vent.  However, he is very confused.  He is asking for his son.  I met with patient's son and his family in the [place] conference room to discuss diagnosis, prognosis, GOC, EOL wishes, disposition, and options.  I introduced Palliative Medicine as specialized medical care for people living with serious illness. It focuses on providing relief from the symptoms and stress of a serious illness.   Brief life review was discussed.  George Potter  was always very active and had a very full life.  His wife passed away in 02-07-2010.  George Potter was their only son. George Potter continued to live independently up until this admission  We discussed patient's current illness and what it means in the larger context of his ongoing co-morbidities. Current clinical status was reviewed.  Family understands that bowel ischemia can be an devastating event, especially in the setting of advanced age.  Created space and opportunity for patient and family to explore thoughts and feelings regarding current medical situation. Values and goals of care important to patient and family were attempted to be elicited.  The difference between full scope medical intervention and comfort care was considered.  I introduced the concept of a comfort path to family, emphasizing that this path involves de-escalating and stopping full scope medical interventions, allowing a natural course to occur. Discussed that the goal is comfort and dignity rather than cure/prolonging life.   Discussed transitioning to comfort care while in the hospital, and what that would look like--keeping him clean and dry, no labs, no artificial hydration or feeding, no antibiotics, and administration of medication for pain and dyspnea.   Discussed that George Potter has a very long road to recovery and will likely not return to a functional status.  They do not feel this is good quality of life for him because he so greatly valued his independence.  Family is understandably very emotional and tearful.  They share feeling that [name] wants to go be with his wife in heaven.  They wish to transition to full comfort care at this time.  Questions and concerns addressed. Family encouraged to call with questions or concerns.    Review of Systems  Unable to perform ROS: Mental status change    Objective:   Primary Diagnoses: Present on Admission:  Chronic mesenteric ischemia [facility])   Physical Exam Vitals reviewed.   Constitutional:      General: He is not in acute distress.    Appearance: He is ill-appearing.  Neurological:     Mental Status: He is alert. He is confused.     Motor: Weakness present.     Vital Signs:  BP 126/75   Pulse 82   Temp 98.4 F (36.9 C) (Esophageal)   Resp 16   Ht _0  (1.778 m)   Wt 74.5 kg   SpO2 99%   BMI 23.57 kg/m   Palliative Assessment/Data: PPS 10%     Assessment & Plan:   SUMMARY OF RECOMMENDATIONS   Full comfort measures initiated Code status changed to DNR/DNI  Added orders for symptom management at EOL as well as  Discontinued orders that were not focused on comfort Per pharmacy, plan to run TPN at half rate 2 hours then D/C PMT will continue to follow   Symptom Management:  Morphine infusion Lorazepam (ATIVAN) prn for anxiety Haloperidol (HALDOL) prn for agitation  Glycopyrrolate (ROBINUL) for excessive secretions Ondansetron (ZOFRAN) prn for nausea Polyvinyl alcohol (LIQUIFILM TEARS) prn for dry eyes Antiseptic oral rinse (BIOTENE) prn for dry mouth  Primary Decision Maker: Son George Potter  Prognosis:  Hours - Days  Discharge Planning:  To Be Determined, likely hospital death    Discussed with: Dr. Valeta Harms, RN, and pharmacist    Thank you for allowing [place] to participate in the care of George Potter   Signed by: George Confer, NP Palliative Medicine Team  Team Phone # [phone]  For individual providers, please see AMION

## 2022-08-06 NOTE — Progress Notes (Addendum)
PHARMACY - TOTAL PARENTERAL NUTRITION CONSULT NOTE  Indication: Acute mesenteric ischemia s/p small bowel resection  Patient Measurements: Height: '5\' 10"'$  (177.8 cm) Weight: 74.5 kg (164 lb 3.9 oz) IBW/kg (Calculated) : 73 TPN AdjBW (KG): 68 Body mass index is 23.57 kg/m.  Assessment:  45 YOM with a history of chronic mesenteric ischemia, Afib, HTN and PAD with prior ischemic left leg s/p thrombectomy in 2019 who presented to [facility] with severe abdominal pain.  Found to have acute SMA occlusion, underwent percutaneous thrombectomy on 8/7 then developed post-operative pain and underwent ex-lap for acute mesenteric ischemia and had SBR and bowel was left in discontinuity with wound vac in place. Patient is back in the OR 8/10 for re-exploration with more small bowel removed. Last meal was 8/7 AM. Patient received D5NS 8/8- 8/9. Pharmacy consulted to manage TPN.   Glucose / Insulin: no hx DM - CBGs < 180 s/p dexamethasone 8/10 Used 5 units SSI in past 24hr Electrolytes: all WNL (K 4 and Phos 2.8 post KPhos 35mol IV, CoCa low normal at 8.7) Renal: SCr down to BL at 1.28, BUN 34 Hepatic: LFTs wnl. Tbili 1.7 down. TG 104 (Propofol being weaned as able)  Intake / Output; MIVF: UOP 0.5 ml/kg/hr, NG 1281m net +10L Off bicarb gtt 8/10 GI Imaging: none since start of TPN GI Surgeries / Procedures:  8/7 SMA thrombectomy 8/8 ex lap small bowel resection, left in discontinuity  8/10 ex lap, further SB resection & anastomosis, abd wall closure   Central access: triple lumen CVC TPN start date: 08/03/2022   Nutritional Goals:  RD Estimated Needs Total Energy Estimated Needs: 1900-2000 kcals Total Protein Estimated Needs: 115-130 g Total Fluid Estimated Needs: >/= 1.9 L  Current Nutrition:  TPN  Propofol ~6 ml/hr = ~294 kCal/day  Plan:  Continue TPN at goal rate of 85 mL/hr with low amount of lipids given still on propofol TPN and Propofol will provide 118g AA, 30g ILE, 255g CHO and 1930 kCal,  meeting 100% of needs Electrolytes in TPN: Na increased to 100 mEq/L on 8/11, increase K to 35106mL, increase Ca to 5mE70m, Mg 4mEq105m increase Phos slightly to 17mmo82m Cl:Ac 1:2 Add standard MVI and trace elements to TPN  Continue sensitive SSI Q6H KPhos 10mmol60mto maintain levels within goal Ca gluc 1gm IV  Standard TPN labs on Mon/Thurs, labs in AM Monitor Propofol rate, increase lipids in TPN as able (goal ILE in TPN 29g/L = 59g ILE per day)   Takeem Krotzer D. Derris Millan, PMina MarbleD, BCPS, BCCCP [id number], 8:03 AM  =======================  Addendum: Transition to comfort care Reduce TPN to 40 ml/hr, then stop at 1800 D/C TPN labs and nursing care orders  Adryan Druckenmiller D. Shaquille Murdy, PMina MarbleD, BCPS, BCCCP [id number], 1:45 PM

## 2022-08-06 NOTE — Progress Notes (Signed)
PCCM:  Attempted SAT SBT. Shut off propofol.  Patient was hypertensive very agitated still very confused not following any bands.  His respiratory rate is still very low intrinsic respiratory rate less than 10. We will stop propofol start low-dose Precedex. Try to avoid any additional opiate delivery. Retry SBT again this morning.  Garner Nash, DO [place] [facility] 08/06/2022 8:31 AM

## 2022-08-07 DIAGNOSIS — Z7189 Other specified counseling: Secondary | ICD-10-CM | POA: Diagnosis not present

## 2022-08-07 DIAGNOSIS — K55059 Acute (reversible) ischemia of intestine, part and extent unspecified: Secondary | ICD-10-CM | POA: Diagnosis not present

## 2022-08-07 DIAGNOSIS — K551 Chronic vascular disorders of intestine: Secondary | ICD-10-CM | POA: Diagnosis not present

## 2022-08-07 DIAGNOSIS — Z515 Encounter for palliative care: Secondary | ICD-10-CM | POA: Diagnosis not present

## 2022-08-26 NOTE — Progress Notes (Signed)
   08/22/2022 0930  Clinical Encounter Type  Visited With Family (SON: George Potter)  Visit Type Initial;Patient actively dying  Referral From Physician Frederik Pear, MD)  Consult/Referral To Chaplain Melvenia Beam)  Recommendations End of Life - Family Support  Spiritual Encounters  Spiritual Needs Emotional;Grief support   This Chaplain had opportunity to sit with George Potter. [place] son, George Potter, at patient's bedside - Offering meaningful presence and reflective listening, allowing George Potter to share stories about his father, and his love for his father. 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 George Potter also spoke of his own children and grandchildren, sharing family pictures of all. George Potter is very much at peace with transitioning his father to [place]. Chaplain will remain available for patient's family as requested. [address] [place], [place] [phone]

## 2022-08-26 NOTE — Progress Notes (Signed)
PCCM:  This is a 88 year old gentleman with chronic mesenteric ischemia A-fib, PAD.  Taken to the operating room for SMA thrombectomy small bowel resection for ischemic bowel.  Patient was successfully extubated.  He participated in a palliative care discussion with himself and family.  Decision was made to become a DNR not to reintubate.  Focus on supportive care and pain management.  Palliative care team will help continue to manage symptom control.  Pulmonary critical care will sign off at this time.  [place] [facility] 08-21-2022 8:22 AM

## 2022-08-26 NOTE — Progress Notes (Signed)
Daily Progress Note   Patient Name: George Potter       Date: 08-13-2022 DOB: [birth year]  Age: 88 y.o. MRN#: [id number] Attending Physician: Thomes Lolling* Primary Care Physician: Crist Infante, MD Admit Date: 07/28/2022 Length of Stay: 5 days  Reason for Consultation/Follow-up: Establishing goals of care and Terminal Care  HPI/Patient Profile:  88 y.o. male  with past medical history of chronic mesenteric ischemia and A-fib (not on AC past 3 years due to rectal bleeding and bruising), PAD with prior ischemic left leg (required iliofemoral thrombectomy 09/2018), and HTN.  He presented to [facility] ED on 08/06/2022 with complaints of abdominal pain, most severely in the RLQ.  Found to have acute occlusion of SMA and was started on heparin and transferred to [facility] for vascular surgery evaluation.  He underwent percutaneous thrombectomy of SMA via right femoral on 8/7 with initial symptom improvement.  Patient acutely worsened overnight with blood noted in his stool and inability to move his bowels.  He was admitted to [facility]. Palliative Medicine was consulted for goals of care  Subjective:   Subjective: Chart Reviewed. Updates received. Patient Assessed. Created space and opportunity for patient  and family to explore thoughts and feelings regarding current medical situation.  Today's Discussion: Today I went to see the patient at the bedside.  Initially no family was present although the son did show up later on my visit.  He appears comfortable.  Per nursing staff he is on 10 mg of morphine drip with no boluses or Ativan needed recently.  The patient is unresponsive at this point and tachycardic, but irregular consistent with A-fib.  His breathing is irregular but unlabored, lungs clear and no heart murmur.  When the patient's son arrived we had a substantial conversation about his current status.  The patient's son seemed to be having somewhat of a difficult time and so I allowed room and time for  him to express his thoughts and feelings.  He shared that his father is the last of his siblings.  We discussed the patient's wife who passed away sometime ago.  We also discussed patient's recent decline.  The patient's son George Potter is appreciative of the care his father has been receiving and his comfort care.  He discusses his 2-year-old son who is very close to his grandfather and is persistently asking to visit.  Per nursing staff they would allow an exception if he would like to visit.  I cautioned the patient to prepare his son that his grandfather will not be very responsive and will look "odd" with irregular breathing and other sites and sounds he may not be prepared for.  He states that he would prepare him if he decides to bring him, but he is not sure if he will yet.  I provided emotional and general support through therapeutic listening, empathy, sharing of stories, and other techniques. I answered all questions and addressed all concerns to the best of my ability.   Review of Systems  Unable to perform ROS: Patient unresponsive    Objective:   Vital Signs:  BP 132/71   Pulse 90   Temp 98.6 F (37 C) (Core)   Resp (!) 7   Ht '5\' 10"'$  (1.778 m)   Wt 74.5 kg   SpO2 99%   BMI 23.57 kg/m   Physical Exam: Physical Exam Vitals and nursing note reviewed.  Constitutional:      General: He is not in acute distress.    Appearance: He  is ill-appearing.  HENT:     Head: Normocephalic and atraumatic.  Cardiovascular:     Rate and Rhythm: Tachycardia present. Rhythm irregular.     Heart sounds: No murmur heard. Pulmonary:     Comments: Breathing unlabored but irregular Skin:    General: Skin is warm and dry.  Neurological:     Mental Status: He is unresponsive.     Palliative Assessment/Data: 10%   Assessment & Plan:   Impression: Present on Admission:  Chronic mesenteric ischemia [facility])  88 year old male currently on full comfort care.  The patient appears to be  approaching end-of-life.  Anticipate in-hospital death, likely sometime today given his vital irregularity.  Patient's son seems to be doing okay, but somewhat having a difficult time.  The patient does appear comfortable.  He is on a morphine drip at 10 mg/h, no needed boluses recently per nursing staff.  Overall prognosis grave.  SUMMARY OF RECOMMENDATIONS   Continue comfort measures Notify palliative medicine for any need of new medicines or dose adjustment Continued family support PMT will continue to follow  Symptom Management:  Morphine infusion Lorazepam (ATIVAN) prn for anxiety Haloperidol (HALDOL) prn for agitation  Glycopyrrolate (ROBINUL) for excessive secretions Ondansetron (ZOFRAN) prn for nausea Polyvinyl alcohol (LIQUIFILM TEARS) prn for dry eyes Antiseptic oral rinse (BIOTENE) prn for dry mouth  Code Status: DNR  Prognosis: Hours - Days  Discharge Planning: Anticipated Hospital Death  Discussed with: Patient, nursing staff, medical team  Thank you for allowing [place] to participate in the care of Darlin Drop PMT will continue to support holistically.  Billing based on MDM: High  Problems Addressed: One acute or chronic illness or injury that poses a threat to life or bodily function  Amount and/or Complexity of Data: Category 3:Discussion of management or test interpretation with external physician/other qualified health care professional/appropriate source (not separately reported)  Risks: Parenteral controlled substances   Walden Field, NP Palliative Medicine Team  Team Phone # [phone] (Nights/Weekends)  08/24/2021, 8:17 AM

## 2022-08-26 NOTE — Progress Notes (Signed)
Vascular surgery:  Patient DNR, extubated.  Supportive care. Seems to be comfortable this morning.  Family very appreciative  Will continue to follow.  Broadus John MD

## 2022-08-26 NOTE — Progress Notes (Signed)
Patient ID: George Potter, male   DOB: 1926-05-07, 88 y.o.   MRN: [id number] PT extubated yesterday Pt requested comfort care.  Surgery will sign off

## 2022-08-26 NOTE — Discharge Summary (Signed)
STACIE KNUTZEN MRN: [id number] DOB/AGE: 08/16/1926 88 y.o.  Admit date: 07/31/2022 Discharge date: 2022/08/23  Admission Diagnosis: Acute mesenteric ischemia  Discharge Diagnoses:  same  Secondary Diagnoses: Principal Problem:   Chronic mesenteric ischemia ([place]) Active Problems:   Pressure injury of skin   Acute respiratory failure ([place])   Shock ([place])   Acute mesenteric ischemia ([facility])   Malnutrition of moderate degree   Procedures: 1.  Ultrasound-guided cannulation right common femoral artery 2.  Aortogram 3.  Selection superior mesenteric artery 4.  Percutaneous mechanical thrombectomy superior mesenteric artery with CAT 7 lightning penumbra 5.  Mynx device closure right common femoral artery 6.  Moderate sedation with fentanyl and Versed for 44 minutes  PROCEDURE:   LAPAROSCOPY DIAGNOSTIC ,EXPLORATORY LAPAROTOMY, small bowel resection, application of abthera vac (N/A)   PROCEDURE:   EXPLORATORY LAPAROTOMY (N/A) Small bowel resection Small bowel anastomosis, closure of abdominal wall   Discharged Condition: Deceased  Hospital Course: Mr. Memoli was admitted from the emergency department to the catheterization lab where he underwent SMA embolectomy that was initially successful.  The next day the patient was noted to have increasing abdominal pain and lactic acidosis for which she was taken to the operating room for diagnostic laparoscopy and required exploratory laparotomy with small bowel resection.  He was taken back a second time to the operating room and his abdomen was closed.  Ultimately plan was for extubation to comfort care and the patient died shortly after extubation.  Consults:  Treatment Team:  Waynetta Sandy, MD Ralene Ok, MD Critical care medicine  Significant Diagnostic Studies: CBC    Latest Ref Rng & Units 08/06/2022   11:55 AM 08/06/2022    4:24 AM 08/05/2022    4:26 AM  CBC  WBC 4.0 - 10.5 K/uL 22.9  18.1  18.6    Hemoglobin 13.0 - 17.0 g/dL 8.2  7.3  8.8   Hematocrit 39.0 - 52.0 % 24.2  20.9  24.8   Platelets 150 - 400 K/uL 64  56  70        COAG Lab Results  Component Value Date   INR 1.2 08/03/2022   INR 1.0 07/22/2008   No results found for: "PTT"    Allergies as of August 23, 2022   No Known Allergies      Medication List     ASK your doctor about these medications    atorvastatin 40 MG tablet Commonly known as: LIPITOR Take 40 mg by mouth every morning.   bismuth subsalicylate 536 RW/43XV suspension Commonly known as: PEPTO BISMOL Take 30 mLs by mouth every 6 (six) hours as needed (acid reflux).   Eliquis 2.5 MG Tabs tablet Generic drug: apixaban Take 2.5 mg by mouth 2 (two) times daily. Ask about: Which instructions should I use?   ferrous sulfate 325 (65 FE) MG tablet Take 325 mg by mouth every morning.   metoprolol succinate 25 MG 24 hr tablet Commonly known as: TOPROL-XL Take 1 tablet (25 mg total) by mouth daily.   naproxen sodium 220 MG tablet Commonly known as: ALEVE Take 220 mg by mouth daily as needed (pain).   omeprazole 20 MG capsule Commonly known as: PRILOSEC Take 20 mg by mouth daily as needed for indigestion.   Potassium 99 MG Tabs Take 99 mg by mouth every morning.   Refresh 1.4-0.6 % ophthalmic solution Generic drug: polyvinyl alcohol-povidone Place 1 drop into both eyes daily as needed (for dry eyes).   Vitamin D3 25  MCG (1000 UT) Caps Take 1,000 Units by mouth every morning. Ask about: Which instructions should I use?         Diasia Henken C. Donzetta Matters, MD [facility] Office: [phone] Pager: [phone]   08/16/2022, 2:22 PM

## 2022-08-26 DEATH — deceased
